# Patient Record
Sex: Male | Born: 1935 | Race: White | Hispanic: No | Marital: Married | Smoking: Former smoker
Health system: Southern US, Community
[De-identification: ages and names within clinical notes are randomized; demographics above are authoritative.]

## PROBLEM LIST (undated history)

## (undated) DIAGNOSIS — I2699 Other pulmonary embolism without acute cor pulmonale: Secondary | ICD-10-CM

## (undated) DIAGNOSIS — I639 Cerebral infarction, unspecified: Secondary | ICD-10-CM

## (undated) DIAGNOSIS — N200 Calculus of kidney: Secondary | ICD-10-CM

## (undated) DIAGNOSIS — K449 Diaphragmatic hernia without obstruction or gangrene: Secondary | ICD-10-CM

## (undated) DIAGNOSIS — H3530 Unspecified macular degeneration: Secondary | ICD-10-CM

## (undated) DIAGNOSIS — I6529 Occlusion and stenosis of unspecified carotid artery: Secondary | ICD-10-CM

## (undated) DIAGNOSIS — I10 Essential (primary) hypertension: Secondary | ICD-10-CM

## (undated) DIAGNOSIS — E669 Obesity, unspecified: Secondary | ICD-10-CM

## (undated) DIAGNOSIS — J449 Chronic obstructive pulmonary disease, unspecified: Secondary | ICD-10-CM

## (undated) DIAGNOSIS — E119 Type 2 diabetes mellitus without complications: Secondary | ICD-10-CM

## (undated) HISTORY — DX: Obesity, unspecified: E66.9

## (undated) HISTORY — DX: Cerebral infarction, unspecified: I63.9

## (undated) HISTORY — PX: FINGER SURGERY: SHX640

## (undated) HISTORY — PX: EYE SURGERY: SHX253

## (undated) HISTORY — DX: Essential (primary) hypertension: I10

## (undated) HISTORY — DX: Occlusion and stenosis of unspecified carotid artery: I65.29

## (undated) HISTORY — DX: Type 2 diabetes mellitus without complications: E11.9

---

## 2001-08-22 ENCOUNTER — Ambulatory Visit (HOSPITAL_COMMUNITY): Admission: RE | Admit: 2001-08-22 | Discharge: 2001-08-22 | Payer: Self-pay | Admitting: Pulmonary Disease

## 2001-12-04 ENCOUNTER — Other Ambulatory Visit: Admission: RE | Admit: 2001-12-04 | Discharge: 2001-12-04 | Payer: Self-pay | Admitting: Dermatology

## 2002-07-17 ENCOUNTER — Ambulatory Visit (HOSPITAL_COMMUNITY): Admission: RE | Admit: 2002-07-17 | Discharge: 2002-07-17 | Payer: Self-pay | Admitting: Pulmonary Disease

## 2002-08-08 ENCOUNTER — Ambulatory Visit (HOSPITAL_COMMUNITY): Admission: RE | Admit: 2002-08-08 | Discharge: 2002-08-08 | Payer: Self-pay | Admitting: Internal Medicine

## 2003-05-19 ENCOUNTER — Emergency Department (HOSPITAL_COMMUNITY): Admission: EM | Admit: 2003-05-19 | Discharge: 2003-05-19 | Payer: Self-pay | Admitting: Emergency Medicine

## 2003-07-23 ENCOUNTER — Encounter (INDEPENDENT_AMBULATORY_CARE_PROVIDER_SITE_OTHER): Payer: Self-pay | Admitting: Cardiology

## 2003-07-23 ENCOUNTER — Inpatient Hospital Stay (HOSPITAL_COMMUNITY): Admission: EM | Admit: 2003-07-23 | Discharge: 2003-07-25 | Payer: Self-pay | Admitting: *Deleted

## 2003-07-31 ENCOUNTER — Encounter (HOSPITAL_COMMUNITY): Admission: RE | Admit: 2003-07-31 | Discharge: 2003-08-30 | Payer: Self-pay | Admitting: Neurology

## 2004-04-09 ENCOUNTER — Other Ambulatory Visit: Admission: RE | Admit: 2004-04-09 | Discharge: 2004-04-09 | Payer: Self-pay | Admitting: Dermatology

## 2004-06-10 ENCOUNTER — Ambulatory Visit (HOSPITAL_COMMUNITY): Admission: RE | Admit: 2004-06-10 | Discharge: 2004-06-10 | Payer: Self-pay | Admitting: Pulmonary Disease

## 2006-11-05 ENCOUNTER — Emergency Department (HOSPITAL_COMMUNITY): Admission: EM | Admit: 2006-11-05 | Discharge: 2006-11-05 | Payer: Self-pay | Admitting: *Deleted

## 2006-11-08 ENCOUNTER — Ambulatory Visit (HOSPITAL_COMMUNITY): Admission: RE | Admit: 2006-11-08 | Discharge: 2006-11-08 | Payer: Self-pay | Admitting: Pulmonary Disease

## 2007-05-17 ENCOUNTER — Emergency Department (HOSPITAL_COMMUNITY): Admission: EM | Admit: 2007-05-17 | Discharge: 2007-05-17 | Payer: Self-pay | Admitting: Emergency Medicine

## 2007-07-17 ENCOUNTER — Ambulatory Visit (HOSPITAL_COMMUNITY): Admission: RE | Admit: 2007-07-17 | Discharge: 2007-07-17 | Payer: Self-pay | Admitting: Pulmonary Disease

## 2008-04-10 ENCOUNTER — Emergency Department (HOSPITAL_COMMUNITY): Admission: EM | Admit: 2008-04-10 | Discharge: 2008-04-10 | Payer: Self-pay | Admitting: Emergency Medicine

## 2008-04-20 ENCOUNTER — Emergency Department (HOSPITAL_COMMUNITY): Admission: EM | Admit: 2008-04-20 | Discharge: 2008-04-20 | Payer: Self-pay | Admitting: Emergency Medicine

## 2008-09-27 ENCOUNTER — Ambulatory Visit (HOSPITAL_COMMUNITY): Admission: RE | Admit: 2008-09-27 | Discharge: 2008-09-27 | Payer: Self-pay | Admitting: Pulmonary Disease

## 2008-11-18 ENCOUNTER — Encounter (HOSPITAL_COMMUNITY): Admission: RE | Admit: 2008-11-18 | Discharge: 2008-12-18 | Payer: Self-pay | Admitting: Neurology

## 2008-12-10 ENCOUNTER — Ambulatory Visit (HOSPITAL_COMMUNITY): Admission: RE | Admit: 2008-12-10 | Discharge: 2008-12-10 | Payer: Self-pay | Admitting: Neurology

## 2008-12-19 ENCOUNTER — Encounter (HOSPITAL_COMMUNITY): Admission: RE | Admit: 2008-12-19 | Discharge: 2009-01-18 | Payer: Self-pay | Admitting: Neurology

## 2008-12-20 ENCOUNTER — Encounter (INDEPENDENT_AMBULATORY_CARE_PROVIDER_SITE_OTHER): Payer: Self-pay | Admitting: Neurology

## 2008-12-20 ENCOUNTER — Encounter: Payer: Self-pay | Admitting: Cardiology

## 2008-12-20 ENCOUNTER — Ambulatory Visit: Payer: Self-pay

## 2009-02-06 ENCOUNTER — Inpatient Hospital Stay (HOSPITAL_COMMUNITY): Admission: EM | Admit: 2009-02-06 | Discharge: 2009-02-10 | Payer: Self-pay | Admitting: Emergency Medicine

## 2009-02-10 ENCOUNTER — Encounter (INDEPENDENT_AMBULATORY_CARE_PROVIDER_SITE_OTHER): Payer: Self-pay | Admitting: Cardiology

## 2009-02-12 ENCOUNTER — Inpatient Hospital Stay (HOSPITAL_COMMUNITY): Admission: EM | Admit: 2009-02-12 | Discharge: 2009-02-14 | Payer: Self-pay | Admitting: Emergency Medicine

## 2009-03-10 ENCOUNTER — Ambulatory Visit: Payer: Self-pay | Admitting: Vascular Surgery

## 2009-05-06 ENCOUNTER — Ambulatory Visit (HOSPITAL_COMMUNITY): Admission: RE | Admit: 2009-05-06 | Discharge: 2009-05-06 | Payer: Self-pay | Admitting: Pulmonary Disease

## 2009-05-15 ENCOUNTER — Ambulatory Visit: Payer: Self-pay | Admitting: Family Medicine

## 2009-05-16 ENCOUNTER — Inpatient Hospital Stay (HOSPITAL_COMMUNITY): Admission: EM | Admit: 2009-05-16 | Discharge: 2009-05-16 | Payer: Self-pay | Admitting: Emergency Medicine

## 2009-06-20 ENCOUNTER — Ambulatory Visit: Payer: Self-pay | Admitting: Internal Medicine

## 2009-06-20 DIAGNOSIS — K921 Melena: Secondary | ICD-10-CM | POA: Insufficient documentation

## 2009-06-20 DIAGNOSIS — R198 Other specified symptoms and signs involving the digestive system and abdomen: Secondary | ICD-10-CM

## 2009-06-24 ENCOUNTER — Ambulatory Visit (HOSPITAL_COMMUNITY): Admission: RE | Admit: 2009-06-24 | Discharge: 2009-06-24 | Payer: Self-pay | Admitting: Internal Medicine

## 2009-06-24 ENCOUNTER — Ambulatory Visit: Payer: Self-pay | Admitting: Internal Medicine

## 2009-06-25 ENCOUNTER — Encounter: Payer: Self-pay | Admitting: Internal Medicine

## 2009-06-26 ENCOUNTER — Encounter (INDEPENDENT_AMBULATORY_CARE_PROVIDER_SITE_OTHER): Payer: Self-pay

## 2009-06-30 ENCOUNTER — Encounter: Payer: Self-pay | Admitting: Internal Medicine

## 2009-08-04 ENCOUNTER — Ambulatory Visit: Payer: Self-pay | Admitting: Internal Medicine

## 2009-09-25 ENCOUNTER — Encounter (INDEPENDENT_AMBULATORY_CARE_PROVIDER_SITE_OTHER): Payer: Self-pay | Admitting: *Deleted

## 2010-04-02 ENCOUNTER — Ambulatory Visit: Payer: Self-pay | Admitting: Internal Medicine

## 2010-04-03 DIAGNOSIS — R159 Full incontinence of feces: Secondary | ICD-10-CM

## 2010-04-10 ENCOUNTER — Telehealth (INDEPENDENT_AMBULATORY_CARE_PROVIDER_SITE_OTHER): Payer: Self-pay

## 2010-04-21 ENCOUNTER — Ambulatory Visit: Payer: Self-pay | Admitting: Vascular Surgery

## 2010-04-29 ENCOUNTER — Encounter (HOSPITAL_COMMUNITY)
Admission: RE | Admit: 2010-04-29 | Discharge: 2010-05-29 | Payer: Self-pay | Source: Home / Self Care | Attending: Pulmonary Disease | Admitting: Pulmonary Disease

## 2010-05-07 ENCOUNTER — Ambulatory Visit: Payer: Self-pay | Admitting: Internal Medicine

## 2010-06-25 NOTE — Letter (Signed)
Summary: TCS ORDER  TCS ORDER   Imported By: Ricard Dillon 06/25/2009 12:48:04  _____________________________________________________________________  External Attachment:    Type:   Image     Comment:   External Document

## 2010-06-25 NOTE — Progress Notes (Signed)
Summary: PR  Phone Note Call from Patient Call back at Home Phone [phone]   Caller: Spouse- Mrs. Cato Summary of Call: pts wife called with PR. pt is doing better. She stated he was  constipated for 3 days but has been having normal bms since. They want to know if he needs to continue taking the align as well. please advise. they are not expecting a call back untill next week.  Initial call taken by: Hendricks Limes LPN,  April 10, 2010 12:30 PM     Appended Document: PR stay on align and questran  Appended Document: PR pts wife aware

## 2010-06-25 NOTE — Letter (Signed)
Summary: Recall Office Visit  [facility]  [address]   [place], [place] [address]   Phone: [phone]  Fax: [phone]      Sep 25, 2009   George Cook [address] [place], [place]  [address] 09-Sep-1927   Dear George Cook,   According to our records, it is time for you to schedule a follow-up office visit with [place].   At your convenience, please call [phone] to schedule an office visit. If you have any questions, concerns, or feel that this letter is in error, we would appreciate your call.   Sincerely,    [name]  [facility] Ph: [phone]   Fax: [phone]

## 2010-06-25 NOTE — Assessment & Plan Note (Signed)
Summary: FU OV IN ONE MONTH/FECAL INCONTINENCE/SS   Visit Type:  Follow-up Visit Primary Care Provider:  Juanetta Gosling  CC:  follow up visit.  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 He has had 2 episodes of diarrhea since last visit a month ago. Taking 1 gram of Questran every other day. Was at [facility] for a birthday party, ate fried oysters, 30 min later had diarrhea. Not on lomotil anymore. Taking align every other day. Reports BM every day. feels like it is hard at times. avoiding fried foods. No melena or hematochezia.     Current Medications (verified): 1)  Niacin Cr 500 Mg Cr-Caps (Niacin) .... Take 1 Tablet By Mouth Once A Day 2)  Vitamin C 1500 Mg Tabs (Ascorbic Acid) .... Once Daily 3)  Plavix 75 Mg Tabs (Clopidogrel Bisulfate) .... Once Daily 4)  Multivitamins  Tabs (Multiple Vitamin) .... Once Daily 5)  Amaryl 2 Mg Tabs (Glimepiride) .... Take 1 Tablet By Mouth Once A Day 6)  Antera 130mg  .... Take 1 Tablet By Mouth Once A Day 7)  Lomotil 2.5-0.025 Mg Tabs (Diphenoxylate-Atropine) .... One Tablet Every Am 8)  Bumetanide 2 Mg Tabs (Bumetanide) .... Take 1 Tablet By Mouth Once A Day 9)  Aleve .... Two Tablets At Bedtime Daily 10)  Potassium .... Take With Fluid Pill 11)  Align .... Once Daily 12)  Cholestyramine 4 Gm/dose Powd (Cholestyramine) .... Once Daily  Allergies (verified): 1)  ! Jonne Ply  Past History:  Past Medical History: Last updated: 06/20/2009 htn kidney stones hx of cva DM  Review of Systems General:  Denies fever, chills, and anorexia. Eyes:  Denies blurring, irritation, and discharge. ENT:  Denies sore throat, hoarseness, and difficulty swallowing. CV:  Denies chest pains and syncope. Resp:  Denies dyspnea at rest and wheezing. GI:  Denies difficulty swallowing, pain on swallowing, and nausea. GU:  Denies urinary burning and urinary frequency. MS:  Denies joint pain /  LOM and joint swelling. Derm:  Denies rash, itching, and dry skin. Neuro:  Denies weakness and syncope. Psych:  Denies depression and anxiety. Endo:  Denies cold intolerance and heat intolerance.  Vital Signs:  Patient profile:   77 year old male Height:      70 inches Weight:      234 pounds BMI:     33.70 Temp:     97.9 degrees F oral Pulse rate:   68 / minute BP sitting:   138 / 84  (left arm) Cuff size:   large  Vitals Entered By: Hendricks Limes LPN (May 07, 2010 11:11 AM)  Physical Exam  General:  Well developed, well nourished, no acute distress. Mouth:  No deformity or lesions, dentition normal. Lungs:  Clear throughout to auscultation. Heart:  Regular rate and rhythm; no murmurs, rubs,  or bruits. Abdomen:  normal bowel sounds, obese, without guarding, without rebound, no masses, and no hepatomegally or splenomegaly.    Impression & Recommendations:  Problem # 1:  FULL INCONTINENCE OF FECES (ICD-52.79)  77 year old male with chronic diarrhea likely r/t diabetic visceral enteropathy. only 2 episodes of diarrhea in one month. most recent after eating fried foods. likely exacerbation r/t diet. taking 1 gram questran every other day. was taking align every other day as well. occasional hard stools.  Questran 1 gram every other day Align daily Contact us in 2 weeks with progress report F/U in 3 mos or sooner as indicated  Orders: Est. Patient Level II ([address])  Patient Instructions: 1)  Questran 1 gram every other day (which is a quarter of the packet) 2)  Align daily 3)  Call in 2 weeks with report 4)  Follow-up in 3 months 5)  Avoid lomotil 6)  The medication list was reviewed and reconciled.  All changed / newly prescribed medications were explained.  A complete medication list was provided to the patient / caregiver.   Appended Document: FU OV IN ONE MONTH/FECAL INCONTINENCE/SS 3 MON F/U OPV IS IN THE COMPUTER  Appended Document: FU OV IN ONE MONTH/FECAL  INCONTINENCE/SS addendum: pt c/o minor itching/irritation rectal area. last colonoscopy 06/2009 done for diarrhea/hematochezia. thought was that hematochezia secondary to anal irritation. call in short course of anusol suppositories. if pt notes any exacerbation or any hematochezia, he is instructed to call office.

## 2010-06-25 NOTE — Assessment & Plan Note (Signed)
Summary: HOS PP.GU   Visit Type:  Follow-up Visit Primary Care Provider:  Juanetta Gosling  Chief Complaint:  F/U  PP.  History of Present Illness: Pt w/ hx fecal incontinence.  Has had recent benign colonoscopy.  Pt was taking questran 2 grams daily x 3-4 days.  He became "constipated" & had to strain w/ stool, so he d/c'd med.  Since then, no problems w/ incontinence until today where he had accident in the office.   Taking Florinex one daily, instead of Flora Q.  Blood sugars have been in good control.  The patient denies fever, weight loss, weight gain, abdominal pain, abdominal bloating, nausea, vomiting, diarrhea, melena, jaundice, chest pain, shortness of breath, urinary symptoms, back pain, joint pain, anxiety, depression, easy bruising, and swollen glands.    Current Medications (verified): 1)  Fish Oil 1000 Mg Caps (Omega-3 Fatty Acids) .... Take 1 Tablet By Mouth Once A Day 2)  Niacin Cr 500 Mg Cr-Caps (Niacin) .... Take 1 Tablet By Mouth Once A Day 3)  Vitamin C 500 Mg Tabs (Ascorbic Acid) .... Once Daily 4)  Plavix 75 Mg Tabs (Clopidogrel Bisulfate) .... Once Daily 5)  Lisinopril 10 Mg Tabs (Lisinopril) .... Once Daily 6)  Multivitamins  Tabs (Multiple Vitamin) .... Once Daily 7)  Amaryl 2 Mg Tabs (Glimepiride) .... Take 1 Tablet By Mouth Once A Day 8)  Medrol (Pak) 4 Mg Tabs (Methylprednisolone) .... As Directed 9)  Align  Caps (Probiotic Product) .... One By Mouth Daily  Allergies (verified): 1)  ! Asa  Review of Systems      See HPI General:  Denies fever, chills, sweats, anorexia, fatigue, weakness, malaise, weight loss, and sleep disorder. CV:  Denies chest pains, angina, palpitations, syncope, dyspnea on exertion, orthopnea, PND, peripheral edema, and claudication. Resp:  Denies dyspnea at rest, dyspnea with exercise, cough, sputum, wheezing, coughing up blood, and pleurisy. GI:  Complains of gas/bloating; denies difficulty swallowing, pain on swallowing, nausea,  indigestion/heartburn, vomiting, vomiting blood, abdominal pain, jaundice, bloody BM's, and black BMs. GU:  Denies urinary burning, blood in urine, urinary frequency, urinary hesitancy, nocturnal urination, and urinary incontinence. Derm:  Denies rash, itching, dry skin, hives, moles, warts, and unhealing ulcers. Psych:  Denies depression, anxiety, memory loss, suicidal ideation, hallucinations, paranoia, phobia, and confusion. Heme:  Denies bruising, bleeding, and enlarged lymph nodes.  Vital Signs:  Patient profile:   77 year old male Height:      70 inches Weight:      230 pounds BMI:     33.12 Temp:     97.8 degrees F oral Pulse rate:   72 / minute BP sitting:   140 / 80  (left arm) Cuff size:   large  Vitals Entered By: Cloria Spring LPN (August 04, 2009 2:46 PM)  Physical Exam  General:  obese.  Well developed, no acute distress. Head:  Normocephalic and atraumatic. Eyes:  Sclera clear, no icterus. Mouth:  No deformity or lesions Lungs:  Clear throughout to auscultation. Heart:  Regular rate and rhythm; no murmurs, rubs,  or bruits. Abdomen:  Soft, nontender and nondistended. No masses, hepatosplenomegaly or hernias noted. Normal bowel sounds.obese, without guarding, and without rebound.   Rectal:  Pt w/ fecal incontinence Msk:  Symmetrical with no gross deformities. Normal posture. Pulses:  Normal pulses noted. Extremities:  trace pedal edema.  clubbing Neurologic:  Alert and  oriented x4;  grossly normal neurologically. Skin:  Intact without significant lesions or rashes. Psych:  Alert and cooperative.  Normal mood and affect.  Impression & Recommendations:  Problem # 1:  CHANGE IN BOWELS (ICD-30.99) 77 y/o caucasian male w/ incontinence probably related to diabetic enteropathy improved w/ questran & probiotic.  Orders: Est. Patient Level III (04540)  Patient Instructions: 1)  ALIGN coupon given to pt  2)  Resume questran 2grams not within 2 hrs over other meds  daily as needed.  if constipated, hold questran until [facility] then resume.  Can try every other day. 3)  Office visit in 2 months or call sooner if needed.

## 2010-06-25 NOTE — Letter (Signed)
Summary: referral= hawkins  referral= hawkins   Imported By: Ricard Dillon 06/30/2009 15:57:16  _____________________________________________________________________  External Attachment:    Type:   Image     Comment:   External Document

## 2010-06-25 NOTE — Assessment & Plan Note (Signed)
Summary: NPP/CHANGE IN BOWEL HABITS.GU   Visit Type:  Initial Consult Primary Care Provider:  hawkins  Chief Complaint:  change in bowel habits.  History of Present Illness: Pleasant 77 year old gentleman referred by Dr. Juanetta Gosling to further evaluate a several month history of insidiously progressing intermittent fecal seepage and incontinence. He tells me he typically has one to 2 bowel movements daily over the past several months he continues to have that type of frequency however, he often gets  very little urge to have a bowel movement and not uncommonly has episodes of incontinence trying to get to the bathroom. He also notes intermittent low volume of blood per rectum occasionally.  He does not have stools at night. He has not lost any weight he does not have any upper GI tract symptoms such as odynophagia, dysphagia come early satiety, nausea vomiting no family history of polyps or colon cancer. He underwent a screening colonoscopy back in 2004 by Dr. Karilyn Cota. He was found to have a diminutive polyp in the proximal sigmoid colon which was ablated with cold biopsy and a few sigmoid diverticula; biopsy  came back inflammatory; he was told to return in 10 years for followup examination.  Mr. Goyer has been on antibiotics for respiratory infections only 2 occasions this past year but according to the patient and wife, bowel symptoms began prior to the antibiotic therapy. He was going to have stool studies done through Dr. Juanetta Gosling office but they have not yet been done as he reports.  Mr. Grassel is diabetic but really isn't on any specific treatment at this time. He had been on metformin thinking this might have something to do with his bowel symptoms, this agent was stopped one month ago but symptoms have not changed.  Preventive Screening-Counseling & Management  Alcohol-Tobacco     Smoking Status: quit  Current Medications (verified): 1)  Fish Oil 1000 Mg Caps (Omega-3 Fatty Acids) .... 2 Once  Daily 2)  Niacin Cr 500 Mg Cr-Caps (Niacin) .... 2 Once Daily 3)  Vitamin C 500 Mg Tabs (Ascorbic Acid) .... Once Daily 4)  Plavix 75 Mg Tabs (Clopidogrel Bisulfate) .... Once Daily 5)  Lisinopril 10 Mg Tabs (Lisinopril) .... Once Daily 6)  Multivitamins  Tabs (Multiple Vitamin) .... Once Daily  Allergies (verified): 1)  ! Jonne Ply  Past History:  Past Medical History: htn kidney stones hx of cva DM  Family History: Father: deceased- breathing problems Mother:deceased- cva, dm  Siblings: 3 brothers, 5 sisters No FH of Colon Cancer:  Social History: Marital Status: Married Children: 3 Occupation:retired   Alcohol Use - no Patient is a former smoker.  Smoking Status:  quit  Vital Signs:  Patient profile:   77 year old male Height:      70 inches Weight:      229 pounds BMI:     32.98 Temp:     98.0 degrees F oral Pulse rate:   68 / minute BP sitting:   128 / 88  (left arm) Cuff size:   regular  Vitals Entered By: Hendricks Limes LPN (June 20, 2009 2:59 PM)  Physical Exam  General:  very pleasant elderly oriented gentleman in no acute distress Eyes:  no scleral icterus conjunctiva are pink Lungs:  clear to auscultation Heart:  regular rate and rhythm without murmur gallop rub Abdomen:  abdomen somewhat obese positive bowel sounds soft nontender without appreciable mass or organomegaly. He does have some diastases recti present Rectal:  deferred until the time of  colonoscopy  Impression & Recommendations: Impression 77 year old gentleman with insidiously progressive bouts of fecal incontinence and urgency along with low volume hematochezia over the last several months. I really do not get a history of frank diarrhea. This is in stark contrast to his pre-morbid bowel habits he's had lifelong.  The cause of his hematochezia needs to be investigated further as well. I suppose this could be an unusual manifestation of microscopic colitis or infectious process. In addition,  he could have an element of diabetic visceral enteropathy predisposing him to bowel dysfunction.  Recommendations: At any rate, Mr. Schools needs a diagnostic colonoscopy near future. Risks, benefits complications, alternatives have been discussed. Questions have been answered. He's agreeable. We'll make further recommendations after colonoscopy has  being performed.  I want to thank Dr. Juanetta Gosling for his kind referral.  Other Orders: New Patient Level IV ([address])

## 2010-06-25 NOTE — Letter (Signed)
Summary: Patient Notice, Colon Biopsy Results  [facility]  [address]   [place], [place] [address]   Phone: [phone]  Fax: [phone]       June 26, 2009   George Cook [address] [place], [place]  [address] [birth year]    Dear Mr. Men,  I am pleased to inform you that the biopsies taken during your recent colonoscopy did not show any evidence of cancer upon pathologic examination.  Additional information/recommendations:   __Continue with the treatment plan as outlined on the day of your exam.   Please call us if you are having persistent problems or have questions about your condition that have not been fully answered at this time.  Sincerely,    Hendricks Limes LPN  [facility] Ph: [phone]    Fax: [phone]

## 2010-06-25 NOTE — Assessment & Plan Note (Signed)
Summary: fu ov/fecal incontinence/ss   Visit Type:  Follow-up Visit Primary Care Provider:  Juanetta Gosling  Chief Complaint:  F/U fecal incontinence/diarrhea.  History of Present Illness: 77 year old gentleman with ongoing issues of fecal incontinence and " diarrhea". He's had these symptoms for years. Recent colonoscopy reassuring. No evidence of infection or colitis. He actually got constipated on Questran 2 g orally daily - stop taking it. He takes Lomotil each morning; he still may an occasional bout of incontinence. Often does not get the urge to have a stool. He may not get the urge and doesn't make it to the bathroom in time. He is any rectal bleeding, no abdominal pain. He stopped taking Align. Again, he stopped taking Questran l because of constipationat the 2 g dose.  Preventive Screening-Counseling & Management  Alcohol-Tobacco     Smoking Status: quit > 6 months  Current Medications (verified): 1)  Niacin Cr 500 Mg Cr-Caps (Niacin) .... Take 1 Tablet By Mouth Once A Day 2)  Vitamin C 1500 Mg Tabs (Ascorbic Acid) .... Once Daily 3)  Plavix 75 Mg Tabs (Clopidogrel Bisulfate) .... Once Daily 4)  Multivitamins  Tabs (Multiple Vitamin) .... Once Daily 5)  Amaryl 2 Mg Tabs (Glimepiride) .... Take 1 Tablet By Mouth Once A Day 6)  Antera 130mg  .... Take 1 Tablet By Mouth Once A Day 7)  Lomotil 2.5-0.025 Mg Tabs (Diphenoxylate-Atropine) .... One Tablet Every Am 8)  Bumetanide 2 Mg Tabs (Bumetanide) .... Take 1 Tablet By Mouth Once A Day 9)  Aleve .... Two Tablets At Bedtime Daily  Allergies (verified): 1)  ! Jonne Ply  Past History:  Past Medical History: Last updated: 07-01-2009 htn kidney stones hx of cva DM  Family History: Last updated: 07/01/2009 Father: deceased- breathing problems Mother:deceased- cva, dm  Siblings: 3 brothers, 5 sisters No FH of Colon Cancer:  Social History: Last updated: 07/01/2009 Marital Status: Married Children: 3 Occupation:retired   Alcohol Use  - no Patient is a former smoker.   Risk Factors: Smoking Status: quit > 6 months (04/02/2010)  Past Surgical History: NONE  Social History: Smoking Status:  quit > 6 months  Physical Exam  General:  conversant in no acute distress accompanied by his wife  no jaundice conjunctiva are pink Abdomen:  obese positive bowel sounds soft nontender without appreciable mass or organomegaly  Impression & Recommendations: Impression: 77 year old gentleman with chronic long-standing diabetes and fecal incontinence issues. He really does not have frank diarrhea. I suspect he has diabetic visceral enteropathy  He actually got constipated on Questran at a  dose of 2 g daily.  I am concerned about his ongoing daily use of Lomotil secondary to potential side effects.  Recommendations: Revisit Questran at a dose of 1 g orally daily  not to be taken within 2 hours of other medication times.  Keep a stool diary. We'll plan to see him back in one month. He is to call us to let us know  how he is doing in 7 days on this regimen. Adjustments in his regimen may be made at that time.  Other Orders: Est. Patient Level IV (95621)

## 2010-08-11 ENCOUNTER — Encounter (INDEPENDENT_AMBULATORY_CARE_PROVIDER_SITE_OTHER): Payer: Self-pay | Admitting: *Deleted

## 2010-08-20 NOTE — Letter (Signed)
Summary: Recall Office Visit  [facility]  [address]   [place], [place] [address]   Phone: [phone]  Fax: [phone]      August 11, 2010   George Cook [address] [place], [place]  [address] 1927/11/19   Dear Mr. Violette,   According to our records, it is time for you to schedule a follow-up office visit with [place].   At your convenience, please call [phone] to schedule an office visit. If you have any questions, concerns, or feel that this letter is in error, we would appreciate your call.   Sincerely,    [name]  [facility] Ph: [phone]   Fax: [phone]

## 2010-08-24 LAB — URINALYSIS, ROUTINE W REFLEX MICROSCOPIC
Bilirubin Urine: NEGATIVE
Glucose, UA: NEGATIVE mg/dL
Hgb urine dipstick: NEGATIVE
Ketones, ur: 15 mg/dL — AB
Leukocytes, UA: NEGATIVE
Nitrite: NEGATIVE
Protein, ur: 100 mg/dL — AB
Specific Gravity, Urine: 1.031 — ABNORMAL HIGH (ref 1.005–1.030)
Urobilinogen, UA: 0.2 mg/dL (ref 0.0–1.0)
pH: 5.5 (ref 5.0–8.0)

## 2010-08-24 LAB — POCT CARDIAC MARKERS: Troponin i, poc: <0.05 ng/mL (ref 0.00–0.09)

## 2010-08-24 LAB — URINE CULTURE
Colony Count: NO GROWTH
Culture: NO GROWTH

## 2010-08-24 LAB — BASIC METABOLIC PANEL
Calcium: 8.6 mg/dL (ref 8.4–10.5)
Chloride: 102 mEq/L (ref 96–112)
Creatinine, Ser: 0.8 mg/dL (ref 0.4–1.5)
GFR calc Af Amer: >60 mL/min (ref >60)
GFR calc Af Amer: >60 mL/min (ref >60)
GFR calc non Af Amer: >60 mL/min (ref >60)
Potassium: 4.2 mEq/L (ref 3.5–5.1)
Sodium: 134 mEq/L — ABNORMAL LOW (ref 135–145)

## 2010-08-24 LAB — URINE MICROSCOPIC-ADD ON

## 2010-08-24 LAB — CBC
HCT: 36.2 % — ABNORMAL LOW (ref 39.0–52.0)
HCT: 40.4 % (ref 39.0–52.0)
Hemoglobin: 12.5 g/dL — ABNORMAL LOW (ref 13.0–17.0)
Hemoglobin: 13.7 g/dL (ref 13.0–17.0)
MCHC: 33.9 g/dL (ref 30.0–36.0)
MCV: 85.4 fL (ref 78.0–100.0)
MCV: 85.6 fL (ref 78.0–100.0)
Platelets: 161 K/uL (ref 150–400)
RBC: 4.24 MIL/uL (ref 4.22–5.81)
RBC: 4.72 MIL/uL (ref 4.22–5.81)
RDW: 13.7 % (ref 11.5–15.5)
WBC: 5.8 10*3/uL (ref 4.0–10.5)
WBC: 6.5 10*3/uL (ref 4.0–10.5)

## 2010-08-24 LAB — DIFFERENTIAL
Basophils Absolute: 0 K/uL (ref 0.0–0.1)
Basophils Relative: 0 % (ref 0–1)
Eosinophils Absolute: 0 K/uL (ref 0.0–0.7)
Eosinophils Relative: 1 % (ref 0–5)
Lymphocytes Relative: 16 % (ref 12–46)
Lymphs Abs: 1.1 10*3/uL (ref 0.7–4.0)
Monocytes Absolute: 0.4 10*3/uL (ref 0.1–1.0)
Monocytes Relative: 7 % (ref 3–12)
Neutro Abs: 5 10*3/uL (ref 1.7–7.7)
Neutrophils Relative %: 76 % (ref 43–77)

## 2010-08-24 LAB — GLUCOSE, CAPILLARY: Glucose-Capillary: 138 mg/dL — ABNORMAL HIGH (ref 70–99)

## 2010-08-24 LAB — CULTURE, BLOOD (ROUTINE X 2)
Culture: NO GROWTH
Culture: NO GROWTH

## 2010-08-24 LAB — POCT I-STAT 3, VENOUS BLOOD GAS (G3P V): pH, Ven: 7.422 — ABNORMAL HIGH (ref 7.250–7.300)

## 2010-08-24 LAB — BASIC METABOLIC PANEL WITH GFR
BUN: 14 mg/dL (ref 6–23)
CO2: 25 meq/L (ref 19–32)
Chloride: 99 meq/L (ref 96–112)
Glucose, Bld: 194 mg/dL — ABNORMAL HIGH (ref 70–99)
Potassium: 3.8 meq/L (ref 3.5–5.1)

## 2010-08-24 LAB — BRAIN NATRIURETIC PEPTIDE: Pro B Natriuretic peptide (BNP): <30 pg/mL (ref 0.0–100.0)

## 2010-08-24 LAB — HEMOGLOBIN A1C: Hgb A1c MFr Bld: 8 % — ABNORMAL HIGH (ref 4.6–6.1)

## 2010-08-24 LAB — LACTIC ACID, PLASMA: Lactic Acid, Venous: 3.5 mmol/L — ABNORMAL HIGH (ref 0.5–2.2)

## 2010-08-28 LAB — GLUCOSE, CAPILLARY
Glucose-Capillary: 119 mg/dL — ABNORMAL HIGH (ref 70–99)
Glucose-Capillary: 122 mg/dL — ABNORMAL HIGH (ref 70–99)
Glucose-Capillary: 138 mg/dL — ABNORMAL HIGH (ref 70–99)
Glucose-Capillary: 144 mg/dL — ABNORMAL HIGH (ref 70–99)
Glucose-Capillary: 151 mg/dL — ABNORMAL HIGH (ref 70–99)
Glucose-Capillary: 154 mg/dL — ABNORMAL HIGH (ref 70–99)
Glucose-Capillary: 155 mg/dL — ABNORMAL HIGH (ref 70–99)
Glucose-Capillary: 176 mg/dL — ABNORMAL HIGH (ref 70–99)
Glucose-Capillary: 181 mg/dL — ABNORMAL HIGH (ref 70–99)
Glucose-Capillary: 200 mg/dL — ABNORMAL HIGH (ref 70–99)
Glucose-Capillary: 258 mg/dL — ABNORMAL HIGH (ref 70–99)

## 2010-08-28 LAB — BASIC METABOLIC PANEL
BUN: 9 mg/dL (ref 6–23)
BUN: 17 mg/dL (ref 6–23)
CO2: 27 mEq/L (ref 19–32)
CO2: 27 mEq/L (ref 19–32)
Calcium: 8.7 mg/dL (ref 8.4–10.5)
Calcium: 8.8 mg/dL (ref 8.4–10.5)
Calcium: 8.9 mg/dL (ref 8.4–10.5)
Chloride: 106 mEq/L (ref 96–112)
Chloride: 101 mEq/L (ref 96–112)
Creatinine, Ser: 0.82 mg/dL (ref 0.4–1.5)
Creatinine, Ser: 0.87 mg/dL (ref 0.4–1.5)
Creatinine, Ser: 0.98 mg/dL (ref 0.4–1.5)
GFR calc Af Amer: >60 mL/min (ref >60)
GFR calc Af Amer: >60 mL/min (ref >60)
GFR calc non Af Amer: >60 mL/min (ref >60)
GFR calc non Af Amer: 57 mL/min — ABNORMAL LOW (ref >60)
Glucose, Bld: 162 mg/dL — ABNORMAL HIGH (ref 70–99)
Potassium: 3.9 mEq/L (ref 3.5–5.1)
Potassium: 4.2 mEq/L (ref 3.5–5.1)
Sodium: 140 mEq/L (ref 135–145)
Sodium: 136 mEq/L (ref 135–145)

## 2010-08-28 LAB — CBC
HCT: 37.2 % — ABNORMAL LOW (ref 39.0–52.0)
HCT: 39.6 % (ref 39.0–52.0)
HCT: 41.2 % (ref 39.0–52.0)
Hemoglobin: 12.9 g/dL — ABNORMAL LOW (ref 13.0–17.0)
Hemoglobin: 13.6 g/dL (ref 13.0–17.0)
Hemoglobin: 14.5 g/dL (ref 13.0–17.0)
MCHC: 34.1 g/dL (ref 30.0–36.0)
MCHC: 34.3 g/dL (ref 30.0–36.0)
MCHC: 34.6 g/dL (ref 30.0–36.0)
MCV: 84.1 fL (ref 78.0–100.0)
MCV: 84.5 fL (ref 78.0–100.0)
MCV: 84.2 fL (ref 78.0–100.0)
Platelets: 160 10*3/uL (ref 150–400)
Platelets: 170 10*3/uL (ref 150–400)
Platelets: 174 10*3/uL (ref 150–400)
RBC: 4.55 MIL/uL (ref 4.22–5.81)
RBC: 4.82 MIL/uL (ref 4.22–5.81)
RBC: 4.89 MIL/uL (ref 4.22–5.81)
RDW: 13.1 % (ref 11.5–15.5)
WBC: 4.9 10*3/uL (ref 4.0–10.5)
WBC: 5.8 10*3/uL (ref 4.0–10.5)
WBC: 6.9 10*3/uL (ref 4.0–10.5)

## 2010-08-28 LAB — COMPREHENSIVE METABOLIC PANEL
BUN: 11 mg/dL (ref 6–23)
CO2: 30 mEq/L (ref 19–32)
Calcium: 9.1 mg/dL (ref 8.4–10.5)
Creatinine, Ser: 0.92 mg/dL (ref 0.4–1.5)
GFR calc Af Amer: >60 mL/min (ref >60)
GFR calc non Af Amer: >60 mL/min (ref >60)
Glucose, Bld: 224 mg/dL — ABNORMAL HIGH (ref 70–99)

## 2010-08-28 LAB — URINE CULTURE: Colony Count: NO GROWTH

## 2010-08-28 LAB — DIFFERENTIAL
Eosinophils Absolute: 0.5 10*3/uL (ref 0.0–0.7)
Eosinophils Relative: 7 % — ABNORMAL HIGH (ref 0–5)
Lymphocytes Relative: 27 % (ref 12–46)
Lymphs Abs: 1.9 10*3/uL (ref 0.7–4.0)
Monocytes Absolute: 0.6 10*3/uL (ref 0.1–1.0)
Monocytes Relative: 8 % (ref 3–12)

## 2010-08-28 LAB — LIPID PANEL
Cholesterol: 193 mg/dL (ref 0–200)
LDL Cholesterol: UNDETERMINED mg/dL (ref 0–99)
Total CHOL/HDL Ratio: 5.7 RATIO

## 2010-08-28 LAB — URINALYSIS, ROUTINE W REFLEX MICROSCOPIC
Bilirubin Urine: NEGATIVE
Glucose, UA: 100 mg/dL — AB
Hgb urine dipstick: NEGATIVE
Nitrite: NEGATIVE
Protein, ur: NEGATIVE mg/dL
Specific Gravity, Urine: 1.01 (ref 1.005–1.030)
Urobilinogen, UA: 0.2 mg/dL (ref 0.0–1.0)
Urobilinogen, UA: 0.2 mg/dL (ref 0.0–1.0)
pH: 5.5 (ref 5.0–8.0)

## 2010-08-28 LAB — BRAIN NATRIURETIC PEPTIDE: Pro B Natriuretic peptide (BNP): 71 pg/mL (ref 0.0–100.0)

## 2010-08-28 LAB — CULTURE, BLOOD (ROUTINE X 2)
Culture: NO GROWTH
Culture: NO GROWTH
Report Status: 9272010
Report Status: 9272010

## 2010-08-28 LAB — APTT: aPTT: 27 seconds (ref 24–37)

## 2010-08-28 LAB — PROTIME-INR: INR: 1 (ref 0.00–1.49)

## 2010-08-28 LAB — D-DIMER, QUANTITATIVE: D-Dimer, Quant: 0.41 ug/mL-FEU (ref 0.00–0.48)

## 2010-08-28 LAB — CK TOTAL AND CKMB (NOT AT ARMC): Relative Index: INVALID (ref 0.0–2.5)

## 2010-08-28 LAB — HEMOGLOBIN A1C: Hgb A1c MFr Bld: 8.6 % — ABNORMAL HIGH (ref 4.6–6.1)

## 2010-08-30 LAB — CREATININE, SERUM
Creatinine, Ser: 1 mg/dL (ref 0.4–1.5)
GFR calc Af Amer: >60 mL/min (ref >60)

## 2010-10-06 NOTE — Procedures (Signed)
CAROTID DUPLEX EXAM   INDICATION:  Carotid disease.   HISTORY:  Diabetes:  Yes.  Cardiac:  No.  Hypertension:  No.  Smoking:  Previous.  Previous Surgery:  No.  CV History:  History of strokes in both hemispheres, complaint of  dizziness.  Amaurosis Fugax No, Paresthesias No, Hemiparesis No                                       RIGHT             LEFT  Brachial systolic pressure:         156               150  Brachial Doppler waveforms:         Normal            Normal  Vertebral direction of flow:        Antegrade         Antegrade  DUPLEX VELOCITIES (cm/sec)  CCA peak systolic                   49                61  ECA peak systolic                   70                79  ICA peak systolic                   71                124  ICA end diastolic                   14                25  PLAQUE MORPHOLOGY:                  Heterogeneous     Heterogeneous  PLAQUE AMOUNT:                      Mild              Mild / moderate  PLAQUE LOCATION:                    Proximal ICA / CCA                  Proximal ICA   IMPRESSION:  1. No hemodynamically significant stenosis at the right internal      carotid artery with plaque formations as described above.  2. Doppler velocity suggests high end 20%-39% stenosis of the left      proximal internal carotid artery.  3. No significant change noted when compared to the previous exam on      03/10/2009.   ___________________________________________  Quita Skye. Hart Rochester, M.D.   CH/MEDQ  D:  04/21/2010  T:  04/21/2010  Job:  [id number]

## 2010-10-06 NOTE — Consult Note (Signed)
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  This  gentleman has a remote history of right brain stroke in 2005, which  consisted of some slurred speech and some left-sided weakness with tPA.  He had good relief of symptoms and had another stroke in the spring or  summer of 2010 affecting his left side, which he also regained good  function.  September 16 he developed onset of clumsiness in his right  upper extremity with weakness and was found to have evidence of a left  brain stroke by MRI scanning with no evidence of hemorrhage.  During his  evaluation, he was found to have bilateral mild carotid occlusive  disease by ultrasound.  A CT angiogram revealed what was determined to  be a 65% left internal carotid stenosis, which was smooth.  I have  reviewed the CT angiogram as well as the MRI and carotid duplex exams  for this patient.  He has had good retrieval of his function in the  right side since that time and was referred today for his carotid  disease.  He has had no symptoms of amaurosis fugax and no recurrent  neurologic symptoms, although he does state that he has had some  weakness in his right lower extremity for the last several months.   PAST MEDICAL HISTORY:  1. Out of control diabetes - Stable.  2. Dyslipidemia - Stable.  3. Hypertension - Stable.  4. History of tobacco abuse.  5. Obesity.  6. Negative for coronary artery disease or COPD.   PREVIOUS SURGERIES:  None.  Has had colonoscopy.   FAMILY HISTORY:  Positive for diabetes and stroke in his mother and a  sister.  Coronary artery disease in father and a brother.   SOCIAL HISTORY:  He has 4 children and is married.  He quit smoking in  1993.  He has been retired since that  time.  He smoked 1 to 2 packs of  cigarettes a day for 40+ years.  He does not use alcohol.   REVIEW OF SYSTEMS:  The patient does have dyspnea on exertion, a heart  murmur, history of hiatal hernia, urinary frequency, deep vein  thrombosis in the left leg 18 months ago, diffuse arthritis.  No  claudication.  Otherwise, all systems are negative.  Please see health  encounter form.   ALLERGIES:  Aspirin.   MEDICATIONS:  Include Plavix 75 mg per day since 2005.  He was on  Coumadin for 6 months 2 years ago but has been discontinued since then.   PHYSICAL EXAMINATION:  Blood pressure is 132/70, heart rate 70,  respirations 18.  Generally, he is alert and oriented x3.  HEENT exam is  negative.  His neck is supple.  3+ carotid pulses.  No bruits are  audible.  Neurologic exam is normal.  No palpable adenopathy in the  neck.  Skin is free of rashes.  Chest is clear to auscultation.  Cardiovascular exam is a regular rhythm with no murmurs.  Abdomen is  soft and nontender.  No palpable masses.  Has 3+ femoral and popliteal  pulses bilaterally.  He does have 2+ edema in both lower extremities,  left worse than right, and from the knee to the foot.   I reviewed his carotid duplex exam, which was done in our office, and  compared that to the CT angiogram MRI scan, and other previous studies.  I do not feel that his carotid disease is severe enough to account for  his left brain stroke since it is very mild in our office in the 20% to  30% range in severity.  It does not appear ulcerated.  He has had  strokes in both hemispheres in the past.  Etiology is probably something  other than carotid disease.  I discussed this at length with his family,  including his sister and wife.  I do not feel carotid surgery is  indicated at this time.  We will see him back in 12 months with carotid  duplex exam unless he develops any symptoms in the interim.   George [name] [place], M.D.  Electronically Signed    JDL/MEDQ  D:  03/10/2009  T:  03/10/2009  Job:  [id number]   cc:   George [name], M.D.  George Feinstein, MD

## 2010-10-06 NOTE — Procedures (Signed)
CAROTID DUPLEX EXAM   INDICATION:  Followup of carotid disease.   HISTORY:  Diabetes:  Yes  Cardiac:  No  Hypertension:  No  Smoking:  Previous, 1993 quit.  Previous Surgery:  CV History:  Amaurosis Fugax Yes  No, Paresthesias Yes No, Hemiparesis Yes No                                       RIGHT             LEFT  Brachial systolic pressure:         117               125  Brachial Doppler waveforms:         Triphasic         Triphasic  Vertebral direction of flow:        Antegrade         Antegrade  DUPLEX VELOCITIES (cm/sec)  CCA peak systolic                   40                41  ECA peak systolic                   58                58  ICA peak systolic                   60                84  ICA end diastolic                   11                26  PLAQUE MORPHOLOGY:                  Mixed             Mixed  PLAQUE AMOUNT:                      Mild              Mild  PLAQUE LOCATION:                    ICA               ICA   IMPRESSION:  20% to 39% stenosis noted on internal carotid arteries  bilaterally.   ___________________________________________  Quita Skye Hart [place], M.D.   CJ/MEDQ  D:  03/10/2009  T:  03/10/2009  Job:  [id number]

## 2010-10-09 NOTE — Discharge Summary (Signed)
NAME:  George Cook, George Cook                           ACCOUNT NO.:  [id number]   MEDICAL RECORD NO.:  [id number]                   PATIENT TYPE:  INP   LOCATION:  3040                                 FACILITY:  [facility]   PHYSICIAN:  Pramod P. Pearlean Brownie, MD                 DATE OF BIRTH:  25-Aug-1927   DATE OF ADMISSION:  07/23/2003  DATE OF DISCHARGE:  07/25/2003                                 DISCHARGE SUMMARY   DISCHARGE DIAGNOSES:  1. Acute small right high parietal infarct.  2. Type 2 diabetes.  3. History of cigarette use.  4. History of kidney stones.   DISCHARGE MEDICATIONS:  Plavix 75 mg daily.   STUDIES:  1. CT of head on admission shows two or three infarcts in the right basal     ganglia which are old.  No acute abnormalities.  Next CT 24 hours after     tPA administration again shows old right basal ganglia infarct.  No acute     hemorrhage or mass.  No new areas of abnormality.  2. MRI of the brain shows small acute high parietal subcortical infarction.  3. MRA of the brain shows no acute occlusion of large or medium-size     vessels.  4. A 2-D echocardiogram shows ejection fraction of 65 to75% with inadequate     LV wall function.  No obvious embolic source, questionable mild aortic     stenosis.  5. Carotid Dopplers were normal.  6. Chest x-ray: Probable mild pulmonary vascular engorgement and     interstitial edema.  7. EKG: Normal sinus rhythm with occasional PVC, right bundle branch block.     No significant change since last tracing.  8. Transcranial Doppler performed with results pending.  9. Hemoglobin A1C 7.2.  Hemoglobin 15.7, hematocrit 47.8,white blood cells     8.1, platelets 273, differential normal.  Coagulation studies normal.     Chemistry normal except for glucose 152 on admission. Liver function     tests normal.  Homocysteine and lipid panel drawn nonfasting at time of     discharge will need followup.   HISTORY OF PRESENT ILLNESS:  [name] is a  77 year old, right-handed  white male, with a history of new onset diabetes in December 2004, who  collapsed the morning of admission at his truck after eating breakfast at  about 8:45.  He stated left leg gave way.  His friend called 911, and he was  transported to the hospital.  He has no stroke, heart, or lung history.  He  stopped smoking 15 years ago.  Code stroke was called by nurse, and patient  was administered tPA.  He was not a __________ candidate secondary to lace  of severity.  NIH stroke scale on admission was 5.   HOSPITAL COURSE:  MRI did reveal small acute right high parietal infarct,  probably lessened in severity  by administration of tPA.  The patient's left  hemiparesis and dysarthria improved significantly during hospitalization,  and he is almost at baseline at time of discharge.  He will need followup PT  and OT as an outpatient.   Homocysteine and lipids will be drawn at time of discharge and followed up  as outpatient.  Prefer LDL to be less than 100.  Hemoglobin A1C also  elevated at 7.2.  While sugars have remained good per patient, now post  stroke event, need to keep as normal glycemic as possible.  The patient may  require oral agents or stricter diet with weight lost.  This was explained  to patient, wife,and son, and they all understand and agree.  Source of  stroke was felt to be small vessel in origin.  No other abnormalities noted.   CONDITION ON DISCHARGE:  Patient alert and oriented x 3.  Speech clear.  Very mild left hemiparesis at 4+/5.  Ambulates, steady on slope, and when he  increases speed, he loses his balance.  He has decreased fine motor movement  on the left.  Chest is clear.  Heart rate is regular.   DISCHARGE PLANNING:  1. Discharge home with wife.  2. Follow up lipids and homocysteine as outpatient.  Goal LDL less than 100.  3. Will need to start Foltx 1 daily for elevated homocysteine.  4. Follow up with Dr. Juanetta Gosling, primary  physician, within the next month.     Need tight glucose control.  Hemoglobin A1C 7.2 not acceptable in setting     of acute stroke.  5. Follow up with Dr. Delia Heady in two months.  Make appointment.      Annie Main, N.P.                         Pramod P. Pearlean Brownie, MD    SB/MEDQ  D:  07/25/2003  T:  07/26/2003  Job:  [id number]   cc:   Ramon Dredge L. Juanetta Gosling, M.D.  [address]  [place]  [place] [address]  Fax: [phone]

## 2010-10-09 NOTE — Op Note (Signed)
NAME:  George Cook, George Cook                           ACCOUNT NO.:  [id number]   MEDICAL RECORD NO.:  [id number]                   PATIENT TYPE:  AMB   LOCATION:  DAY                                  FACILITY:  [facility]   PHYSICIAN:  Lionel December, M.D.                 DATE OF BIRTH:  1927-06-13   DATE OF PROCEDURE:  08/08/2002  DATE OF DISCHARGE:                                 OPERATIVE REPORT   PROCEDURE:  Total colonoscopy.   ENDOSCOPIST:  Lionel December, M.D.   INDICATIONS:  George Cook is a 77 year old Caucasian male who is here for  screening colonoscopy.  Family history is negative for CRC.  The procedures  were reviewed with the patient and informed consent was obtained.   PREOPERATIVE MEDICATIONS:  Demerol 25 mg IV and Versed 3 mg IV in divided  dose.   INSTRUMENT:  Olympus video system.   FINDINGS:  Procedure was performed in endoscopy suite.  The patient's vital  signs and O2 saturations were monitored during the procedure and remained  stable.  The patient was placed in the left lateral recumbent position and  rectal examination was performed.  This was within normal limits.   The scope was placed in the rectum and advanced under vision into the  sigmoid colon and beyond.  Preparation was excellent.  Scope was passed to  the cecum which was identified by ileocecal valve and appendiceal orifice.  Pictures were taken for the record.  The colonic mucosa was once again  carefully examined on the way out.  There was a single small polyp at the  proximal sigmoid colon which was ablated by cold biopsy.  There were a few  tiny diverticula at the distal sigmoid colon.  The rectal mucosa was normal.   The scope was retroflexed to examine the anorectal junction which is  unremarkable. The endoscope was straightened and withdrawn.  The patient  tolerated the procedure well.   FINAL DIAGNOSES:  1. Small polyp at the proximal sigmoid colon which was ablated by cold     biopsy.  2. A  few diverticula at sigmoid colon.    RECOMMENDATIONS:  1. High fiber diet.  2. I will contact the patient with the biopsy results.  If this is an     adenoma he will return for a follow up exam in 5 years; otherwise could     wait 10.                                               Lionel December, M.D.    NR/MEDQ  D:  08/08/2002  T:  08/08/2002  Job:  [id number]   cc:   Ramon Dredge L. Juanetta Gosling, M.D.  [address]  [place]  [place] [address]  Fax: [phone]

## 2010-10-09 NOTE — H&P (Signed)
NAME:  George Cook, George Cook                           ACCOUNT NO.:  [id number]   MEDICAL RECORD NO.:  [id number]                   PATIENT TYPE:  INP   LOCATION:  3114                                 FACILITY:  [facility]   PHYSICIAN:  Casimiro Needle L. Thad Ranger, M.D.           DATE OF BIRTH:  09/16/1927   DATE OF ADMISSION:  07/23/2003  DATE OF DISCHARGE:                                HISTORY & PHYSICAL   CHIEF COMPLAINT:  Legs gave out.   HISTORY OF PRESENT ILLNESS:  This is the initial inpatient stroke service  admission for this 77 year old right-handed man with a history of diabetes,  who was eating breakfast at a restaurant this morning, and, at approximately  8:45 a.m., walked out to his truck and collapsed.  He states his left leg  gave way.  A friend witnessed the event, immediately called 911, and the  patient was transported to the hospital, where a code stroke was called at  9:15.  The patient initially denied having any similar symptoms, but  subsequently reported that 2 days ago he did have a transient spell in which  his left leg got weak.  He denies any other history of known stroke or  previous stroke-like symptoms.  He was seen in the emergency room for an  episode in which both legs gave out back around Christmas time, and this was  ascribed to high sugar; in fact, his diabetes diagnosis was made at that  time.  This has remained diet controlled since then.  The patient's symptoms  had actually improved since arriving in the emergency room, in that his  speech, which was slurred on admission, is clearing, and he is gaining more  strength on his left side.   MEDICAL HISTORY:  1. Remarkable for diabetes, which was diagnosed in December of 2004, as     above, and is currently diet controlled.  2. History of kidney stones.   PAST SURGICAL HISTORY:  He has had no previous surgeries.   SOCIAL HISTORY:  He lives with his wife.  He has been independent in his  activities of daily  living.  He is not presently working.   MEDICATIONS:  He takes no prescription medications.  He does take Benadryl  p.r.n. for sleep.   REVIEW OF SYSTEMS:  He does have a little bit of a rattling in his chest,  which he has ascribed to allergies.  He also reports occasional bowel  urgency and some frequent nocturia.  He has a little insomnia sometimes,  which is treated with Benadryl.  Full 10-system review of systems otherwise  negative, as outlined in the emergency room and admission nursing records.   PHYSICAL EXAMINATION:  VITAL SIGNS:  Temperature 97.8, blood pressure  145/80, pulse 74, respirations 16, O2 saturation 99% on 2 liters O2 nasal  cannula.  GENERAL:  He is alert, and in no obvious distress.  Speech is mildly  to  moderately dysarthric, but is fluent, and he is able to follow multi-step  commands.  HEENT:  Cranium was normocephalic and atraumatic.  Oropharynx is benign.  NECK:  Supple without carotid bruits.  HEART:  Regular rate and rhythm without murmur.  CHEST:  Clear to auscultation.  ABDOMEN:  Obese but soft, with no hepatosplenomegaly.  EXTREMITIES:  With 1+ edema, good pulses.  NEUROLOGIC:  Cranial nerves - pupils are 3 mm, briskly reactive.  Extraocular movements are full without nystagmus.  Visual fields are full to  confrontation.  Pinprick sensation symmetric over the face.  There is a mild  left facial weakness with a droop.  Tongue and palate move symmetrically.  Motor - a mild left hemiparesis present, a little bit worse in the leg than  the arm.  He maintains at least antigravity strength in all extremity  muscles.  Sensation - reports diminished pinprick sensation in the left  upper and lower extremity compared to the right.  Rapid movements are slow  on the right.  Gait is deferred.  Toes are downgoing.   LABORATORY REVIEW:  CBC revealed a white count of 8.1, hemoglobin 15.7,  platelets 273,000, with a normal differential.  BMET is remarkable for an   elevated glucose of 152.  Otherwise, normal.  Prothrombin time is 13.3  seconds, INR 1.0, PTT 34 seconds.  CT of the head is personally reviewed and  demonstrates a couple of old lacunar infarctions in the basal ganglia and  deep white matter on the left.  There is also some small vessel disease, a  little bit worse on the left than the right.  There appears to be no acute  pathology.  EKG demonstrates right bundle branch block, sinus rhythm, no  acute injury.   IMPRESSION:  1. Acute right brain stroke.  2. Diabetes, previously diet controlled.  3. Elevated blood pressure.   PLAN:  TPA was administered per protocol in the emergency room.  The patient  subsequently be admitted to the neurologic ICU for routine post TPA  management and routine stroke work-up including MRI, MRA, carotid  transcranial Dopplers, echocardiogram, and stroke laboratories.  PT, OT, and  speech therapy consults will be obtained.  Stroke service to follow.                                                Michael L. Thad Ranger, M.D.    MLR/MEDQ  D:  07/23/2003  T:  07/24/2003  Job:  [id number]   cc:   Ramon Dredge L. Juanetta Gosling, M.D.  [address]  [place]  [place] [address]  Fax: [phone]

## 2010-10-09 NOTE — Discharge Summary (Signed)
NAME:  George Cook, George Cook                           ACCOUNT NO.:  [id number]   MEDICAL RECORD NO.:  [id number]                   PATIENT TYPE:  INP   LOCATION:                                       FACILITY:  [facility]   PHYSICIAN:  Annie Main, N.P.                   DATE OF BIRTH:  1927/09/21   DATE OF ADMISSION:  07/23/2003  DATE OF DISCHARGE:  07/25/2003                                 DISCHARGE SUMMARY   ADDENDUM:  At the bottom of the discharge plan, put Plavix 75 mg for  secondary stroke prevention. Then in the paragraph if you will add patient  has allergic reaction to aspirin. Reaction was that of hives. Will not be  able to be placed on Aggrenox secondary to aspirin allergy. Will treat with  Plavix 75 mg q.d.                                                Annie Main, N.P.    SB/MEDQ  D:  07/25/2003  T:  07/26/2003  Job:  [id number]

## 2010-12-11 ENCOUNTER — Encounter (HOSPITAL_COMMUNITY): Payer: Self-pay

## 2010-12-11 ENCOUNTER — Emergency Department (HOSPITAL_COMMUNITY)
Admission: EM | Admit: 2010-12-11 | Discharge: 2010-12-11 | Disposition: A | Discharge status: Home / Self Care | Payer: Medicare Other | Attending: Emergency Medicine | Admitting: Emergency Medicine

## 2010-12-11 ENCOUNTER — Emergency Department (HOSPITAL_COMMUNITY): Payer: Medicare Other

## 2010-12-11 DIAGNOSIS — E119 Type 2 diabetes mellitus without complications: Secondary | ICD-10-CM | POA: Insufficient documentation

## 2010-12-11 DIAGNOSIS — N509 Disorder of male genital organs, unspecified: Secondary | ICD-10-CM | POA: Insufficient documentation

## 2010-12-11 DIAGNOSIS — N453 Epididymo-orchitis: Secondary | ICD-10-CM | POA: Insufficient documentation

## 2010-12-11 DIAGNOSIS — I10 Essential (primary) hypertension: Secondary | ICD-10-CM | POA: Insufficient documentation

## 2010-12-11 DIAGNOSIS — Z87442 Personal history of urinary calculi: Secondary | ICD-10-CM | POA: Insufficient documentation

## 2010-12-11 DIAGNOSIS — Z8673 Personal history of transient ischemic attack (TIA), and cerebral infarction without residual deficits: Secondary | ICD-10-CM | POA: Insufficient documentation

## 2010-12-11 DIAGNOSIS — R109 Unspecified abdominal pain: Secondary | ICD-10-CM | POA: Insufficient documentation

## 2010-12-11 LAB — DIFFERENTIAL
Basophils Absolute: 0 10*3/uL (ref 0.0–0.1)
Eosinophils Absolute: 0.1 10*3/uL (ref 0.0–0.7)
Eosinophils Relative: 1 % (ref 0–5)
Lymphocytes Relative: 12 % (ref 12–46)
Lymphs Abs: 2.3 10*3/uL (ref 0.7–4.0)
Monocytes Absolute: 1.1 10*3/uL — ABNORMAL HIGH (ref 0.1–1.0)

## 2010-12-11 LAB — URINALYSIS, ROUTINE W REFLEX MICROSCOPIC
Bilirubin Urine: NEGATIVE
Glucose, UA: 250 mg/dL — AB
Ketones, ur: NEGATIVE mg/dL
Protein, ur: 100 mg/dL — AB
pH: 6.5 (ref 5.0–8.0)

## 2010-12-11 LAB — CBC
HCT: 45.4 % (ref 39.0–52.0)
MCH: 28.6 pg (ref 26.0–34.0)
MCV: 86.5 fL (ref 78.0–100.0)
Platelets: 228 10*3/uL (ref 150–400)
RDW: 13.1 % (ref 11.5–15.5)
WBC: 18.4 10*3/uL — ABNORMAL HIGH (ref 4.0–10.5)

## 2010-12-11 LAB — BASIC METABOLIC PANEL
CO2: 28 mEq/L (ref 19–32)
Calcium: 9.7 mg/dL (ref 8.4–10.5)
Chloride: 97 mEq/L (ref 96–112)
Creatinine, Ser: 0.83 mg/dL (ref 0.50–1.35)
Glucose, Bld: 133 mg/dL — ABNORMAL HIGH (ref 70–99)

## 2010-12-11 LAB — URINE MICROSCOPIC-ADD ON

## 2010-12-11 MED ORDER — CIPROFLOXACIN IN D5W 400 MG/200ML IV SOLN
400.0000 mg | Freq: Once | INTRAVENOUS | Status: AC
  Administered 2010-12-11: 400 mg via INTRAVENOUS
  Filled 2010-12-11: qty 200

## 2010-12-11 MED ORDER — SODIUM CHLORIDE 0.9 % IV SOLN
INTRAVENOUS | Status: DC
  Administered 2010-12-11: 12:00:00 via INTRAVENOUS

## 2010-12-11 MED ORDER — HYDROMORPHONE HCL 1 MG/ML IJ SOLN
1.0000 mg | Freq: Once | INTRAMUSCULAR | Status: AC
  Administered 2010-12-11: 1 mg via INTRAVENOUS
  Filled 2010-12-11: qty 1

## 2010-12-11 MED ORDER — HYDROCODONE-ACETAMINOPHEN 5-325 MG PO TABS: 20.0000 | ORAL_TABLET | Freq: Four times a day (QID) | ORAL | Status: AC | PRN

## 2010-12-11 MED ORDER — CIPROFLOXACIN HCL 500 MG PO TABS: 500.0000 mg | ORAL_TABLET | Freq: Two times a day (BID) | ORAL | Status: AC

## 2010-12-11 MED ORDER — ONDANSETRON HCL 4 MG/2ML IJ SOLN
4.0000 mg | Freq: Once | INTRAMUSCULAR | Status: AC
  Administered 2010-12-11: 4 mg via INTRAVENOUS
  Filled 2010-12-11: qty 2

## 2010-12-11 NOTE — ED Notes (Signed)
Pt still in radiology.

## 2010-12-11 NOTE — ED Notes (Signed)
Vital signs stable.  02 sat 99% on 2L

## 2010-12-11 NOTE — ED Provider Notes (Signed)
History     Chief Complaint  Patient presents with  . Testicle Pain   Patient is a 77 y.o. male presenting with testicular pain. The history is provided by the patient. No language interpreter was used.  Testicle Pain This is a new problem. Episode onset: 3 days ago. The problem occurs constantly. The problem has not changed since onset.Pertinent negatives include no chest pain, no abdominal pain, no headaches and no shortness of breath. The symptoms are aggravated by nothing. Relieved by: Moderate relief in ED with Dilaudid. He has tried nothing for the symptoms.  C/o right testicular and right groin pain which radiates to abdomen and right lower back onset 3 days ago and persistent since. Notes pain has improved after administration of Dilaudid in ED. Pain aggravated with palpation. Patient also reports he has been squeezing his scrotum "real hard" to help him urinate recently. Denies n/v, dysuria, hematuria.   Patient seen at 12:10 PM  Past Medical History  Diagnosis Date  . HTN (hypertension)   . DM (diabetes mellitus)   . H/O: CVA (cardiovascular accident)   . Kidney stones     History reviewed. No pertinent past surgical history.  History reviewed. No pertinent family history.  History  Substance Use Topics  . Smoking status: Former Games developer  . Smokeless tobacco: Not on file  . Alcohol Use: No      Review of Systems  Constitutional: Negative for fatigue.  HENT: Negative for congestion, sinus pressure and ear discharge.   Eyes: Negative for discharge.  Respiratory: Negative for cough and shortness of breath.   Cardiovascular: Negative for chest pain.  Gastrointestinal: Negative for abdominal pain and diarrhea.  Genitourinary: Positive for scrotal swelling and testicular pain. Negative for dysuria, frequency, hematuria, penile swelling and penile pain.  Musculoskeletal: Negative for back pain.  Skin: Negative for rash.  Neurological: Negative for seizures and headaches.   Hematological: Negative.   Psychiatric/Behavioral: Negative for hallucinations.    Physical Exam  BP 157/73  Pulse 81  Temp(Src) 97.2 F (36.2 C) (Oral)  Resp 20  Ht 5\' 10"  (1.778 m)  Wt 230 lb (104.327 kg)  BMI 33.00 kg/m2  SpO2 99%  Physical Exam  Nursing note and vitals reviewed. Constitutional: He is oriented to person, place, and time. He appears well-developed. No distress.       Hypertensive.   HENT:  Head: Normocephalic and atraumatic.  Eyes: Conjunctivae and EOM are normal. No scleral icterus.  Neck: Normal range of motion. Neck supple.  Cardiovascular: Normal rate and regular rhythm.  Exam reveals no gallop and no friction rub.   No murmur heard. Pulmonary/Chest: Effort normal. No stridor. He has wheezes (bilaterally). He has no rales. He exhibits no tenderness.  Abdominal: Soft. He exhibits no distension. There is tenderness in the right lower quadrant. There is no rebound.  Genitourinary: Penis normal. Right testis shows swelling (approxmately 50% larger than left side) and tenderness. Left testis shows no swelling and no tenderness.       Normal external genitalia. Chaperone present during exam.   Musculoskeletal: Normal range of motion. He exhibits no edema.  Neurological: He is alert and oriented to person, place, and time.  Skin: No rash noted. No erythema.  Psychiatric: He has a normal mood and affect. His behavior is normal.    ED Course  Procedures 2:06 PM Reviewed lab results and imaging results with patient and spouse. Informed of intent to d/c home with medications to treat scrotal infection. Advised of  need for follow up to check progress of the infection/treatment. Patient and spouse agrees with plan set forth at this time.   MDM Results discussed with pt  Results for orders placed during the hospital encounter of 12/11/10  BASIC METABOLIC PANEL      Component Value Range   Sodium 135  135 - 145 (mEq/L)   Potassium 4.4  3.5 - 5.1 (mEq/L)    Chloride 97  96 - 112 (mEq/L)   CO2 28  19 - 32 (mEq/L)   Glucose, Bld 133 (*) 70 - 99 (mg/dL)   BUN 16  6 - 23 (mg/dL)   Creatinine, Ser 4.54  0.50 - 1.35 (mg/dL)   Calcium 9.7  8.4 - 09.8 (mg/dL)   GFR calc non Af Amer >60  >60 (mL/min)   GFR calc Af Amer >60  >60 (mL/min)  CBC      Component Value Range   WBC 18.4 (*) 4.0 - 10.5 (K/uL)   RBC 5.25  4.22 - 5.81 (MIL/uL)   Hemoglobin 15.0  13.0 - 17.0 (g/dL)   HCT 11.9  14.7 - 82.9 (%)   MCV 86.5  78.0 - 100.0 (fL)   MCH 28.6  26.0 - 34.0 (pg)   MCHC 33.0  30.0 - 36.0 (g/dL)   RDW 56.2  13.0 - 86.5 (%)   Platelets 228  150 - 400 (K/uL)  DIFFERENTIAL      Component Value Range   Neutrophils Relative 81 (*) 43 - 77 (%)   Neutro Abs 14.9 (*) 1.7 - 7.7 (K/uL)   Lymphocytes Relative 12  12 - 46 (%)   Lymphs Abs 2.3  0.7 - 4.0 (K/uL)   Monocytes Relative 6  3 - 12 (%)   Monocytes Absolute 1.1 (*) 0.1 - 1.0 (K/uL)   Eosinophils Relative 1  0 - 5 (%)   Eosinophils Absolute 0.1  0.0 - 0.7 (K/uL)   Basophils Relative 0  0 - 1 (%)   Basophils Absolute 0.0  0.0 - 0.1 (K/uL)  URINALYSIS, ROUTINE W REFLEX MICROSCOPIC      Component Value Range   Color, Urine YELLOW  YELLOW    Appearance CLEAR  CLEAR    Specific Gravity, Urine 1.025  1.005 - 1.030    pH 6.5  5.0 - 8.0    Glucose, UA 250 (*) NEGATIVE (mg/dL)   Hgb urine dipstick NEGATIVE  NEGATIVE    Bilirubin Urine NEGATIVE  NEGATIVE    Ketones, ur NEGATIVE  NEGATIVE (mg/dL)   Protein, ur 784 (*) NEGATIVE (mg/dL)   Urobilinogen, UA 0.2  0.0 - 1.0 (mg/dL)   Nitrite NEGATIVE  NEGATIVE    Leukocytes, UA NEGATIVE  NEGATIVE   URINE MICROSCOPIC-ADD ON      Component Value Range   WBC, UA 3-6  <3 (WBC/hpf)   Bacteria, UA MANY (*) RARE    Ct Abdomen Pelvis Wo Contrast  12/11/2010  *RADIOLOGY REPORT*  Clinical Data: Right testicular pain, right groin pain radiating to abdomen, right back pain, history kidney stones, diabetes, stroke, hypertension  CT ABDOMEN AND PELVIS WITHOUT CONTRAST   Technique:  Multidetector CT imaging of the abdomen and pelvis was performed following the standard protocol without intravenous contrast. Sagittal and coronal MPR images reconstructed from axial data set.  Comparison: 11/08/2006  Findings: 7 mm diameter nodule at base of lingula, stable, image 1. 7 mm diameter right middle lobe nodule image 2, stable. Minimal bibasilar atelectasis. Question additional tiny nodules at left lung base, 2  mm range, images 2 and 5. Peripelvic cyst at bilateral kidneys. Tiny calcifications of right kidney may be vascular in origin or nonobstructing calculi. Ureters normal in caliber without calcification. Bladder unremarkable. Enlargement prostate gland, 5.2 x 4.1 cm image 96. Within limits of a nonenhanced exam no focal abnormalities of liver, spleen, pancreas, or adrenal glands otherwise seen. Small stable calcification right adrenal gland unchanged. Diverticulosis of sigmoid colon without surrounding inflammatory changes. Normal appendix. Rectum incompletely distended, unable to exclude rectal wall thickening. Bowel loops otherwise unremarkable. No mass, adenopathy, free fluid or hernia. No acute osseous findings. Bones appear demineralized.  IMPRESSION: Sigmoid diverticulosis without evidence of diverticulitis. Questionable tiny non-obstructing right renal calculi. Stable lung base nodules. Peripelvic renal cysts. Prostatic enlargement. Unable to exclude rectal wall thickening due to underdistension.  Original Report Authenticated By: Lollie Marrow, M.D.   US Scrotum  12/11/2010  *RADIOLOGY REPORT*  Clinical Data:  Testicle pain.  SCROTAL ULTRASOUND DOPPLER ULTRASOUND OF THE TESTICLES  Technique: Complete ultrasound examination of the testicles, epididymis, and other scrotal structures was performed.  Color and spectral Doppler ultrasound were also utilized to evaluate blood flow to the testicles.  Comparison:  None  Findings:  Right testis:  5.1 x 2.6 x 3.0 cm. Increased   vascularity.  Left testis:  4.7 x 2.2 x 2.8 cm. Simple appearing 5 mm cyst in the inferior aspect of the testicle.  Right epididymis:  Increased vascularity in the epididymis.  Left epididymis:  A tiny epididymal cyst.  Otherwise normal.  Hydocele:  Bilateral small hydroceles.  Varicocele:  None  Pulsed Doppler interrogation of both testes demonstrates that there are normal arterial and venous waveforms bilaterally to the testicles.  IMPRESSION:   Increased perfusion to the right testicle and epididymis.  No discrete mass lesions.  The finding is suggestive of epididymo- orchitis.  Small bilateral hydroceles.  Original Report Authenticated By: Gwynn Burly, M.D.   [place] Art/ven Flow Abd Pelv Doppler  12/11/2010  *RADIOLOGY REPORT*  Clinical Data:  Testicle pain.  SCROTAL ULTRASOUND DOPPLER ULTRASOUND OF THE TESTICLES  Technique: Complete ultrasound examination of the testicles, epididymis, and other scrotal structures was performed.  Color and spectral Doppler ultrasound were also utilized to evaluate blood flow to the testicles.  Comparison:  None  Findings:  Right testis:  5.1 x 2.6 x 3.0 cm. Increased  vascularity.  Left testis:  4.7 x 2.2 x 2.8 cm. Simple appearing 5 mm cyst in the inferior aspect of the testicle.  Right epididymis:  Increased vascularity in the epididymis.  Left epididymis:  A tiny epididymal cyst.  Otherwise normal.  Hydocele:  Bilateral small hydroceles.  Varicocele:  None  Pulsed Doppler interrogation of both testes demonstrates that there are normal arterial and venous waveforms bilaterally to the testicles.  IMPRESSION:   Increased perfusion to the right testicle and epididymis.  No discrete mass lesions.  The finding is suggestive of epididymo- orchitis.  Small bilateral hydroceles.  Original Report Authenticated By: Gwynn Burly, M.D.     Chart written by Clarita Crane acting as scribe for [name], MD  I personally performed the services described in this  documentation, which was scribed in my presence. The recorded information has been reviewed and considered.   [name], MD 12/11/10 [phone]

## 2010-12-11 NOTE — ED Notes (Signed)
Pt presents with right testicular pain that radiates up to abdominal area and around to right side of low back. Pt states symptoms started 3 days ago. Pt denies n/v. NAD at this time. Pt to triage via w/c.

## 2010-12-11 NOTE — ED Notes (Signed)
Patient is resting comfortably. 

## 2011-01-26 ENCOUNTER — Ambulatory Visit (INDEPENDENT_AMBULATORY_CARE_PROVIDER_SITE_OTHER): Payer: Medicare Other | Admitting: Urology

## 2011-01-26 ENCOUNTER — Other Ambulatory Visit: Payer: Self-pay | Admitting: Urology

## 2011-01-26 DIAGNOSIS — R3915 Urgency of urination: Secondary | ICD-10-CM

## 2011-01-26 DIAGNOSIS — N5089 Other specified disorders of the male genital organs: Secondary | ICD-10-CM

## 2011-01-26 DIAGNOSIS — N509 Disorder of male genital organs, unspecified: Secondary | ICD-10-CM

## 2011-01-26 DIAGNOSIS — R350 Frequency of micturition: Secondary | ICD-10-CM

## 2011-01-28 ENCOUNTER — Ambulatory Visit (HOSPITAL_COMMUNITY)
Admission: RE | Admit: 2011-01-28 | Discharge: 2011-01-28 | Disposition: A | Discharge status: Home / Self Care | Payer: Medicare Other | Source: Ambulatory Visit | Attending: Urology | Admitting: Urology

## 2011-01-28 ENCOUNTER — Other Ambulatory Visit (HOSPITAL_COMMUNITY): Payer: Self-pay | Admitting: Urology

## 2011-01-28 DIAGNOSIS — R3915 Urgency of urination: Secondary | ICD-10-CM

## 2011-01-28 DIAGNOSIS — R9389 Abnormal findings on diagnostic imaging of other specified body structures: Secondary | ICD-10-CM | POA: Insufficient documentation

## 2011-01-28 DIAGNOSIS — N509 Disorder of male genital organs, unspecified: Secondary | ICD-10-CM

## 2011-01-28 DIAGNOSIS — N5089 Other specified disorders of the male genital organs: Secondary | ICD-10-CM

## 2011-02-02 ENCOUNTER — Ambulatory Visit (INDEPENDENT_AMBULATORY_CARE_PROVIDER_SITE_OTHER): Payer: Medicare Other | Admitting: Urology

## 2011-02-02 DIAGNOSIS — N509 Disorder of male genital organs, unspecified: Secondary | ICD-10-CM

## 2011-02-02 DIAGNOSIS — N3941 Urge incontinence: Secondary | ICD-10-CM

## 2011-02-02 DIAGNOSIS — R3915 Urgency of urination: Secondary | ICD-10-CM

## 2011-03-05 ENCOUNTER — Encounter: Payer: Self-pay | Admitting: Thoracic Diseases

## 2011-03-11 LAB — CBC
MCV: 83.6
Platelets: 205
RDW: 13.3
WBC: 7.6

## 2011-03-11 LAB — COMPREHENSIVE METABOLIC PANEL
AST: 23
Albumin: 3.6
BUN: 12
Chloride: 105
Creatinine, Ser: 0.73
GFR calc Af Amer: >60
Total Bilirubin: 1
Total Protein: 6.7

## 2011-03-11 LAB — DIFFERENTIAL
Basophils Absolute: 0
Eosinophils Relative: 3
Lymphocytes Relative: 17
Lymphs Abs: 1.3
Monocytes Absolute: 0.4
Monocytes Relative: 5
Neutro Abs: 5.7

## 2011-03-11 LAB — URINALYSIS, ROUTINE W REFLEX MICROSCOPIC
Nitrite: NEGATIVE
Protein, ur: NEGATIVE
Specific Gravity, Urine: 1.012
Urobilinogen, UA: 0.2

## 2011-03-11 LAB — POCT CARDIAC MARKERS
CKMB, poc: <1 — ABNORMAL LOW
Myoglobin, poc: 67.8
Troponin i, poc: <0.05

## 2011-03-30 ENCOUNTER — Ambulatory Visit (INDEPENDENT_AMBULATORY_CARE_PROVIDER_SITE_OTHER): Payer: Medicare Other | Admitting: Urology

## 2011-03-30 DIAGNOSIS — N281 Cyst of kidney, acquired: Secondary | ICD-10-CM

## 2011-03-30 DIAGNOSIS — R3915 Urgency of urination: Secondary | ICD-10-CM

## 2011-03-30 DIAGNOSIS — N509 Disorder of male genital organs, unspecified: Secondary | ICD-10-CM

## 2011-04-16 ENCOUNTER — Ambulatory Visit: Payer: Self-pay

## 2011-04-16 ENCOUNTER — Other Ambulatory Visit: Payer: Self-pay

## 2011-04-21 ENCOUNTER — Ambulatory Visit (INDEPENDENT_AMBULATORY_CARE_PROVIDER_SITE_OTHER): Payer: Medicare Other | Admitting: Thoracic Diseases

## 2011-04-21 ENCOUNTER — Ambulatory Visit (INDEPENDENT_AMBULATORY_CARE_PROVIDER_SITE_OTHER): Payer: Medicare Other | Admitting: *Deleted

## 2011-04-21 ENCOUNTER — Encounter: Payer: Self-pay | Admitting: Thoracic Diseases

## 2011-04-21 VITALS — BP 165/92 | HR 69 | Resp 28 | Ht 70.0 in | Wt 234.0 lb

## 2011-04-21 DIAGNOSIS — I6529 Occlusion and stenosis of unspecified carotid artery: Secondary | ICD-10-CM

## 2011-04-21 NOTE — Patient Instructions (Signed)
Stroke Prevention Some medical conditions and some behaviors are associated with an increased chance of having a stroke. You may prevent a stroke by making healthy choices and managing medical conditions. You can reduce your risk of having a stroke by:  Staying physically active. It is recommended that you get at least 30 minutes of activity on most or all days.   Stop smoking.   Limiting alcohol use. Moderate alcohol use is considered to be: No more than 2 drinks per day for men. No more than 1 drink per day for non-pregnant women.   DIET.  A low fat, low cholesterol, low salt and high fiber diet with servings of fruits and vegetables daily will help .   Managing your cholesterol levels.. Take any prescribed medications to control cholesterol as directed by your caregiver.   Managing your diabetes. Diabetic diet as recommended by the ADA. Take any prescribed medications to control diabetes as directed by your caregiver.   Controlling your high blood pressure (hypertension). It is very important to take any prescribed medications to control hypertension .   Aspirin: Take Aspirin daily as prescribed by your physician. Do not take aspirin with blood thinners unless prescribed by your Physician  You may be on "blood thinners" (anticoagulants) Coumadin, Plavix, Agrennox, Effient. All these medications are helpful in reducing the risk of forming abnormal blood clots. If you have the irregular heart rhythm of atrial fibrillation, you may be on a blood thinner.  Be sure you understand all your medication instructions.   SIGNS OF STROKE: SEEK IMMEDIATE MEDICAL CARE IF: You have sudden weakness or numbness of the face, arm, or leg, especially on 1 side of the body.  You have sudden confusion.  You have trouble speaking (aphasia) or understanding.  You have sudden trouble seeing in 1 or both eyes.  You have sudden trouble walking.  You have dizziness.  You have a loss of balance or coordination.   You have a sudden severe headache with no known cause.    You have new chest pain, angina, or an irregular heartbeat.   ANY OF THESE SYMPTOMS MAY REPRESENT A SERIOUS PROBLEM THAT IS AN EMERGENCY. Do not wait to see if the symptoms will go away. Get medical help right away. Call your local emergency services (911 in [place] DO NOT drive yourself to the hospital. Stroke Prevention Some medical conditions and some behaviors are associated with an increased chance of having a stroke. You may prevent a stroke by making healthy choices and managing medical conditions. You can reduce your risk of having a stroke by:  Staying physically active. It is recommended that you get at least 30 minutes of activity on most or all days.   Not smoking.   Limiting alcohol use. Moderate alcohol use is considered to be:   No more than 2 drinks per day for men.   No more than 1 drink per day for nonpregnant women.   Eating healthy foods. Certain diets may be prescribed to address high blood pressure, high cholesterol, diabetes, or obesity. A diet that includes 5 or more servings of fruits and vegetables a day may reduce the risk of stroke.   Managing your cholesterol levels. A low saturated fat, low trans fat, low cholesterol, and high fiber diet may control cholesterol levels. Take any prescribed medications to control cholesterol as directed by your caregiver.   Managing your diabetes. A controlled carbohydrate, controlled sugar diet is recommended to manage diabetes. Take any prescribed medications to   control diabetes as directed by your caregiver.   Controlling your high blood pressure (hypertension). A low salt (sodium), low saturated fat, low trans fat, and low cholesterol diet is recommended to manage high blood pressure. Take any prescribed medications to control hypertension as directed by your caregiver.   Maintaining a healthy weight. A reduced calorie, low sodium, low saturated fat, low trans fat, low  cholesterol diet is recommended to manage weight.   Stopping drug abuse.   Taking medications as directed by your caregiver. For some individuals, aspirin or "blood thinners" (anticoagulants) are helpful in reducing the risk of forming abnormal blood clots that can lead to stroke. If you have the irregular heart rhythm of atrial fibrillation, you should be on a blood thinner unless there is a good reason you cannot take them. Be sure you understand all your medication instructions.  SEEK IMMEDIATE MEDICAL CARE IF:  You have sudden weakness or numbness of the face, arm, or leg, especially on 1 side of the body.   You have sudden confusion.   You have trouble speaking (aphasia) or understanding.   You have sudden trouble seeing in 1 or both eyes.   You have sudden trouble walking.   You have a loss of balance or coordination.   You have a sudden severe headache with no known cause.    ANY OF THESE SYMPTOMS MAY REPRESENT A SERIOUS PROBLEM THAT IS AN EMERGENCY. Do not wait to see if the symptoms will go away. Get medical help right away. Call your local emergency services (911 in [place] DO NOT drive yourself to the hospital. Document Released: 06/17/2004 Document Re-Released: 10/28/2009 [employer] Patient Information 2011 [employer].  

## 2011-04-21 NOTE — Progress Notes (Signed)
Established Carotid Patient   History of Present Illness  George Cook is a 77 y.o. male who has known carotid stenosis.  Patient has not had previous  CEA.  Patient has Negative history of TIA or stroke symptom.  The patient denies amaurosis fugax or monocular blindness.  The patient  denies facial drooping.  Pt. denies hemiplegia.  The patient denies receptive or expressive aphasia.  Pt. 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  These findings are Unchanged from previous exam  Previous angiogram: No:   Past Medical History  Diagnosis Date  . HTN (hypertension)   . DM (diabetes mellitus)   . Kidney stones   . Carotid artery occlusion   . Stroke   . Obesity     Social History History  Substance Use Topics  . Smoking status: Former Smoker -- 1.5 packs/day for 40 years    Types: Cigarettes    Quit date: 05/25/1991  . Smokeless tobacco: Not on file  . Alcohol Use: No    Family History Family History  Problem Relation Age of Onset  . Diabetes Mother   . Stroke Mother   . Coronary artery disease Father   . Stroke Sister   . Diabetes Sister   . Coronary artery disease Brother     Review of Systems : [x]  Positive   [ ]  Negative  General:[ ]  Weight loss,  [ ]  Weight gain, [ ]  Loss of appetite, [ ]  Fever, [ ]  chills  Neurologic: [ ]  Dizziness, [ ]  Blackouts, [ ]  Headaches, [ ]  Seizure [x ] Stroke, [ ]  "Mini stroke", [ ]  Slurred speech, [ ]  Temporary blindness;  [ ] weakness,  Ear/Nose/Throat: [ ]  Change in hearing, [ ]  Nose bleeds, [ ]   Hoarseness  Vascular:[ ]  Pain in legs with walking, [ ]  Pain in feet while lying flat , [ ]   Non-healing ulcer, [ ]  Blood clot in vein,    Pulmonary: [ ]  Home oxygen, [ ]   Productive cough, [ ]  Bronchitis, [ ]  Coughing up blood,  [ ]  Asthma, [ ]  Wheezing  Musculoskeletal:  [ ]  Arthritis, [x ] Joint pain, [ ]  low back pain  Cardiac: [ ]  Chest pain, [ ]  Shortness of breath when lying flat, [x ] Shortness of breath with exertion, [ ]  Palpitations, [ ]  Heart murmur, [ ]   Atrial fibrillation  Hematologic:[ ]  Easy Bruising, [ ]  Anemia; [ ]  Hepatitis  Psychiatric: [ ]   Depression, [ ]  Anxiety   Gastrointestinal: [ ]  Black stool, [ ]  Blood in stool, [ ]  Peptic ulcer disease,  [ ]  Gastroesophageal Reflux, [ ]  Trouble swallowing, [ ]  Diarrhea, [ ]  Constipation  Urinary: [ ]  chronic Kidney disease, [ ]  on HD, [ ]  Burning with urination, [ ]  Frequent urination, [ ]  Difficulty urinating;   Skin: [x ] Rashes, [ ]  Wounds   Allergies  Allergen Reactions  . Aspirin     Current Outpatient Prescriptions  Medication Sig Dispense Refill  . acetaminophen (TYLENOL) 325 MG tablet Take 650 mg  by mouth every 6 (six) hours as needed.        . Ascorbic Acid (VITAMIN C) 1000 MG tablet Take 1,000 mg by mouth daily.        . bumetanide (BUMEX) 2 MG tablet Take 2 mg by mouth daily.        . clopidogrel (PLAVIX) 75 MG tablet Take 75 mg by mouth daily.        . diphenoxylate-atropine (LOMOTIL) 2.5-0.025 MG per tablet Take 1 tablet by mouth 4 (four) times daily as needed.        Marland Kitchen glimepiride (AMARYL) 2 MG tablet Take 2 mg by mouth daily before breakfast.        . Multiple Vitamin (MULTIVITAMIN) capsule Take 1 capsule by mouth daily.        . Potassium 99 MG TABS Take by mouth.        . solifenacin (VESICARE) 10 MG tablet Take 10 mg by mouth daily.        Marland Kitchen atorvastatin (LIPITOR) 40 MG tablet Take 40 mg by mouth daily.        . cholestyramine (QUESTRAN) 4 GM/DOSE powder Take by mouth 3 (three) times daily with  meals.        . fish oil-omega-3 fatty acids 1000 MG capsule Take 1 g by mouth daily.        . hydrocortisone (ANUSOL-HC) 25 MG suppository Place 25 mg rectally 2 (two) times daily.        Marland Kitchen lisinopril (PRINIVIL,ZESTRIL) 10 MG tablet Take 10 mg by mouth daily.        . niacin (NIASPAN) 500 MG CR tablet Take 500 mg by mouth at bedtime.        . Probiotic Product (ALIGN) 4 MG CAPS Take by mouth.          Physical Examination  Filed Vitals:   04/21/11 1513  BP: 165/92  Pulse: 69  Resp: 28    General: WDWN male in NAD GAIT: normal Eyes: PERRLA Pulmonary:  CTAB, non labored breathing   Musculoskeletal: good and equal strength in BUE/BLE  Neurologic: A&O X 3; Appropriate Affect ; SENSATION ;normal; MOTOR FUNCTION: normal 5/5 strength in all tested muscle groups Speech is normal  Assessment: George Cook is a 77 y.o. male who presents with:Asymptomatic0 - 39% Bilateral ICA  stenosis The  ICA stenosis is  Unchanged from previous exam.  Plan: Follow-up in 1 years with Carotid Duplex scan and letter  Pt was given information regarding stroke symptoms and prevention  Clinic MD: CS Edilia Bo, MD

## 2011-04-22 ENCOUNTER — Ambulatory Visit: Payer: Self-pay

## 2011-04-22 ENCOUNTER — Other Ambulatory Visit: Payer: Self-pay

## 2011-04-26 NOTE — Procedures (Unsigned)
CAROTID DUPLEX EXAM  INDICATION:  Carotid disease.  HISTORY: Diabetes:  Yes. Cardiac:  No. Hypertension:  No. Smoking:  Previous. Previous Surgery:  No. CV History:  History of CVA in both hemispheres. Amaurosis Fugax No, Paresthesias No, Hemiparesis No.                                      RIGHT             LEFT Brachial systolic pressure:         150               155 Brachial Doppler waveforms:         Normal            Normal Vertebral direction of flow:        Antegrade         Antegrade DUPLEX VELOCITIES (cm/sec) CCA peak systolic                   40                47 ECA peak systolic                   42                64 ICA peak systolic                   45                91 ICA end diastolic                   12                17 PLAQUE MORPHOLOGY:                  Heterogenous      Heterogenous PLAQUE AMOUNT:                      Mild              Mild/moderate PLAQUE LOCATION:                    Proximal ICA/CCA  Proximal ICA  IMPRESSION: 1. No hemodynamically significant stenosis of the right internal     carotid artery with plaque formations as described above. 2. Left internal carotid artery velocities suggest 1% to 39% stenosis. 3. Stable in comparison to the last examination.  ___________________________________________ Quita Skye Hart [place], M.D.  EM/MEDQ  D:  04/21/2011  T:  04/21/2011  Job:  [id number]

## 2012-04-12 ENCOUNTER — Other Ambulatory Visit: Payer: Self-pay | Admitting: *Deleted

## 2012-04-12 DIAGNOSIS — I63239 Cerebral infarction due to unspecified occlusion or stenosis of unspecified carotid arteries: Secondary | ICD-10-CM

## 2012-04-18 ENCOUNTER — Encounter: Payer: Self-pay | Admitting: Neurosurgery

## 2012-04-19 ENCOUNTER — Other Ambulatory Visit: Payer: Medicare Other

## 2012-04-19 ENCOUNTER — Ambulatory Visit: Payer: Medicare Other | Admitting: Neurosurgery

## 2012-04-25 ENCOUNTER — Ambulatory Visit (INDEPENDENT_AMBULATORY_CARE_PROVIDER_SITE_OTHER): Payer: Medicare Other | Admitting: Urology

## 2012-04-25 DIAGNOSIS — N433 Hydrocele, unspecified: Secondary | ICD-10-CM

## 2012-05-30 ENCOUNTER — Ambulatory Visit: Payer: Medicare Other | Admitting: Urology

## 2012-06-06 ENCOUNTER — Encounter: Payer: Self-pay | Admitting: Neurosurgery

## 2012-06-07 ENCOUNTER — Encounter: Payer: Self-pay | Admitting: Neurosurgery

## 2012-06-07 ENCOUNTER — Ambulatory Visit (INDEPENDENT_AMBULATORY_CARE_PROVIDER_SITE_OTHER): Payer: Medicare Other | Admitting: Neurosurgery

## 2012-06-07 ENCOUNTER — Ambulatory Visit (INDEPENDENT_AMBULATORY_CARE_PROVIDER_SITE_OTHER): Payer: Medicare Other | Admitting: Vascular Surgery

## 2012-06-07 VITALS — BP 144/84 | HR 70 | Resp 16 | Ht 70.0 in | Wt 226.0 lb

## 2012-06-07 DIAGNOSIS — I6529 Occlusion and stenosis of unspecified carotid artery: Secondary | ICD-10-CM | POA: Insufficient documentation

## 2012-06-07 DIAGNOSIS — I63239 Cerebral infarction due to unspecified occlusion or stenosis of unspecified carotid arteries: Secondary | ICD-10-CM

## 2012-06-07 NOTE — Progress Notes (Signed)
Carotid duplex performed @ [facility] 06/07/2012

## 2012-06-07 NOTE — Progress Notes (Signed)
[facility] [place] Carotid Office Note  CC: Carotid surveillance Referring Physician: Hart Rochester  History of Present Illness: 79 year old male patient of Dr. Hart Rochester followed for known carotid stenosis. The patient denies any signs or symptoms of CVA, TIA, amaurosis fugax or any neural deficit. The patient does state he has cataract surgery pending.  Past Medical History  Diagnosis Date  . HTN (hypertension)   . DM (diabetes mellitus)   . Kidney stones   . Carotid artery occlusion   . Stroke   . Obesity     ROS: [x]  Positive   [ ]  Denies    General: [ ]  Weight loss, [ ]  Fever, [ ]  chills Neurologic: [ ]  Dizziness, [ ]  Blackouts, [ ]  Seizure [ ]  Stroke, [ ]  "Mini stroke", [ ]  Slurred speech, [ ]  Temporary blindness; [ ]  weakness in arms or legs, [ ]  Hoarseness Cardiac: [ ]  Chest pain/pressure, [ ]  Shortness of breath at rest [ ]  Shortness of breath with exertion, [ ]  Atrial fibrillation or irregular heartbeat Vascular: [ ]  Pain in legs with walking, [ ]  Pain in legs at rest, [ ]  Pain in legs at night,  [ ]  Non-healing ulcer, [ ]  Blood clot in vein/DVT,   Pulmonary: [ ]  Home oxygen, [ ]  Productive cough, [ ]  Coughing up blood, [ ]  Asthma,  [ ]  Wheezing Musculoskeletal:  [ ]  Arthritis, [ ]  Low back pain, [ ]  Joint pain Hematologic: [ ]  Easy Bruising, [ ]  Anemia; [ ]  Hepatitis Gastrointestinal: [ ]  Blood in stool, [ ]  Gastroesophageal Reflux/heartburn, [ ]  Trouble swallowing Urinary: [ ]  chronic Kidney disease, [ ]  on HD - [ ]  MWF or [ ]  TTHS, [ ]  Burning with urination, [ ]  Difficulty urinating Skin: [ ]  Rashes, [ ]  Wounds Psychological: [ ]  Anxiety, [ ]  Depression   Social History History  Substance Use Topics  . Smoking status: Former Smoker -- 1.5 packs/day for 40 years    Types: Cigarettes    Quit date: 05/25/1991  . Smokeless tobacco: Not on file  . Alcohol Use: No    Family History Family History  Problem Relation Age of Onset  . Diabetes Mother     . Stroke Mother   . Coronary artery disease Father   . Stroke Sister   . Diabetes Sister   . Coronary artery disease Brother     Allergies  Allergen Reactions  . Aspirin     Current Outpatient Prescriptions  Medication Sig Dispense Refill  . acetaminophen (TYLENOL) 325 MG tablet Take 650 mg by mouth every 6 (six) hours as needed.        . Ascorbic Acid (VITAMIN C) 1000 MG tablet Take 1,000 mg by mouth daily.        Marland Kitchen atorvastatin (LIPITOR) 40 MG tablet Take 40 mg by mouth daily.        . bumetanide (BUMEX) 2 MG tablet Take 2 mg by mouth daily.        . cholestyramine (QUESTRAN) 4 GM/DOSE powder Take by mouth 3 (three) times daily with meals.        . clopidogrel (PLAVIX) 75 MG tablet Take 75 mg by mouth daily.        . diphenoxylate-atropine (LOMOTIL) 2.5-0.025 MG per tablet Take 1 tablet by mouth 4 (four) times daily as needed.        . fish oil-omega-3 fatty acids 1000 MG capsule Take 1 g by mouth daily.        Marland Kitchen  glimepiride (AMARYL) 2 MG tablet Take 2 mg by mouth daily before breakfast.        . hydrocortisone (ANUSOL-HC) 25 MG suppository Place 25 mg rectally 2 (two) times daily.        Marland Kitchen lisinopril (PRINIVIL,ZESTRIL) 10 MG tablet Take 10 mg by mouth daily.        . Multiple Vitamin (MULTIVITAMIN) capsule Take 1 capsule by mouth daily.        . niacin (NIASPAN) 500 MG CR tablet Take 500 mg by mouth at bedtime.        . Potassium 99 MG TABS Take by mouth.        . Probiotic Product (ALIGN) 4 MG CAPS Take by mouth.        . solifenacin (VESICARE) 10 MG tablet Take 10 mg by mouth daily.          Physical Examination  Filed Vitals:   06/07/12 1421  BP: 147/85  Pulse: 71  Resp: 16    Body mass index is 32.43 kg/(m^2).  General:  WDWN in NAD Gait: Normal HEENT: WNL Eyes: Pupils equal Pulmonary: normal non-labored breathing , without Rales, rhonchi,  wheezing Cardiac: RRR, without  Murmurs, rubs or gallops; Abdomen: soft, NT, no masses Skin: no rashes, ulcers  noted  Vascular Exam Pulses: 3+ radial pulses bilaterally Carotid bruits: Carotid pulses to auscultation no bruits are heard Extremities without ischemic changes, no Gangrene , no cellulitis; no open wounds;  Musculoskeletal: no muscle wasting or atrophy   Neurologic: A&O X 3; Appropriate Affect ; SENSATION: normal; MOTOR FUNCTION:  moving all extremities equally. Speech is fluent/normal  Non-Invasive Vascular Imaging CAROTID DUPLEX 06/07/2012  Right ICA 20 - 39 % stenosis Left ICA 20 - 39 % stenosis   ASSESSMENT/PLAN: Asymptomatic patient with unchanged exam from previous. The patient will followup in one year with repeat carotid duplex. The patient's questions were encouraged and answered, he is in agreement with this plan.  Lauree Chandler ANP   Clinic MD: Edilia Bo

## 2012-06-08 ENCOUNTER — Encounter: Payer: Self-pay | Admitting: Neurosurgery

## 2012-06-08 ENCOUNTER — Other Ambulatory Visit: Payer: Self-pay | Admitting: *Deleted

## 2012-06-08 DIAGNOSIS — I6529 Occlusion and stenosis of unspecified carotid artery: Secondary | ICD-10-CM

## 2012-06-08 DIAGNOSIS — Z48812 Encounter for surgical aftercare following surgery on the circulatory system: Secondary | ICD-10-CM

## 2012-08-07 ENCOUNTER — Ambulatory Visit (HOSPITAL_COMMUNITY)
Admission: RE | Admit: 2012-08-07 | Discharge: 2012-08-07 | Disposition: A | Discharge status: Home / Self Care | Payer: Medicare Other | Source: Ambulatory Visit | Attending: Pulmonary Disease | Admitting: Pulmonary Disease

## 2012-08-07 ENCOUNTER — Other Ambulatory Visit (HOSPITAL_COMMUNITY): Payer: Self-pay | Admitting: Pulmonary Disease

## 2012-08-07 DIAGNOSIS — E119 Type 2 diabetes mellitus without complications: Secondary | ICD-10-CM | POA: Insufficient documentation

## 2012-08-07 DIAGNOSIS — M51379 Other intervertebral disc degeneration, lumbosacral region without mention of lumbar back pain or lower extremity pain: Secondary | ICD-10-CM | POA: Insufficient documentation

## 2012-08-07 DIAGNOSIS — M5137 Other intervertebral disc degeneration, lumbosacral region: Secondary | ICD-10-CM | POA: Insufficient documentation

## 2012-08-07 DIAGNOSIS — R059 Cough, unspecified: Secondary | ICD-10-CM | POA: Insufficient documentation

## 2012-08-07 DIAGNOSIS — M545 Low back pain, unspecified: Secondary | ICD-10-CM | POA: Insufficient documentation

## 2012-08-07 DIAGNOSIS — I10 Essential (primary) hypertension: Secondary | ICD-10-CM | POA: Insufficient documentation

## 2012-09-18 ENCOUNTER — Encounter (HOSPITAL_COMMUNITY): Payer: Self-pay | Admitting: Pharmacy Technician

## 2012-09-22 NOTE — Patient Instructions (Addendum)
Your procedure is scheduled on: 10/02/2012  Report to [facility] at  830  AM.  Call this number if you have problems the morning of surgery: [phone]   Do not eat food or drink liquids :After Midnight.      Take these medicines the morning of surgery with A SIP OF WATER: none   Do not wear jewelry, make-up or nail polish.  Do not wear lotions, powders, or perfumes.   Do not shave 48 hours prior to surgery.  Do not bring valuables to the hospital.  Contacts, dentures or bridgework may not be worn into surgery.  Leave suitcase in the car. After surgery it may be brought to your room.  For patients admitted to the hospital, checkout time is 11:00 AM the day of discharge.   Patients discharged the day of surgery will not be allowed to drive home.  :     Please read over the following fact sheets that you were given: Coughing and Deep Breathing, Surgical Site Infection Prevention, Anesthesia Post-op Instructions and Care and Recovery After Surgery    Cataract A cataract is a clouding of the lens of the eye. When a lens becomes cloudy, vision is reduced based on the degree and nature of the clouding. Many cataracts reduce vision to some degree. Some cataracts make people more near-sighted as they develop. Other cataracts increase glare. Cataracts that are ignored and become worse can sometimes look white. The white color can be seen through the pupil. CAUSES   Aging. However, cataracts may occur at any age, even in newborns.   Certain drugs.   Trauma to the eye.   Certain diseases such as diabetes.   Specific eye diseases such as chronic inflammation inside the eye or a sudden attack of a rare form of glaucoma.   Inherited or acquired medical problems.  SYMPTOMS   Gradual, progressive drop in vision in the affected eye.   Severe, rapid visual loss. This most often happens when trauma is the cause.  DIAGNOSIS  To detect a cataract, an eye doctor examines the lens. Cataracts are  best diagnosed with an exam of the eyes with the pupils enlarged (dilated) by drops.  TREATMENT  For an early cataract, vision may improve by using different eyeglasses or stronger lighting. If that does not help your vision, surgery is the only effective treatment. A cataract needs to be surgically removed when vision loss interferes with your everyday activities, such as driving, reading, or watching TV. A cataract may also have to be removed if it prevents examination or treatment of another eye problem. Surgery removes the cloudy lens and usually replaces it with a substitute lens (intraocular lens, IOL).  At a time when both you and your doctor agree, the cataract will be surgically removed. If you have cataracts in both eyes, only one is usually removed at a time. This allows the operated eye to heal and be out of danger from any possible problems after surgery (such as infection or poor wound healing). In rare cases, a cataract may be doing damage to your eye. In these cases, your caregiver may advise surgical removal right away. The vast majority of people who have cataract surgery have better vision afterward. HOME CARE INSTRUCTIONS  If you are not planning surgery, you may be asked to do the following:  Use different eyeglasses.   Use stronger or brighter lighting.   Ask your eye doctor about reducing your medicine dose or changing medicines if  it is thought that a medicine caused your cataract. Changing medicines does not make the cataract go away on its own.   Become familiar with your surroundings. Poor vision can lead to injury. Avoid bumping into things on the affected side. You are at a higher risk for tripping or falling.   Exercise extreme care when driving or operating machinery.   Wear sunglasses if you are sensitive to bright light or experiencing problems with glare.  SEEK IMMEDIATE MEDICAL CARE IF:   You have a worsening or sudden vision loss.   You notice redness,  swelling, or increasing pain in the eye.   You have a fever.  Document Released: 05/10/2005 Document Revised: 04/29/2011 Document Reviewed: 01/01/2011 [facility] Patient Information 2012 [place].PATIENT INSTRUCTIONS POST-ANESTHESIA  IMMEDIATELY FOLLOWING SURGERY:  Do not drive or operate machinery for the first twenty four hours after surgery.  Do not make any important decisions for twenty four hours after surgery or while taking narcotic pain medications or sedatives.  If you develop intractable nausea and vomiting or a severe headache please notify your doctor immediately.  FOLLOW-UP:  Please make an appointment with your surgeon as instructed. You do not need to follow up with anesthesia unless specifically instructed to do so.  WOUND CARE INSTRUCTIONS (if applicable):  Keep a dry clean dressing on the anesthesia/puncture wound site if there is drainage.  Once the wound has quit draining you may leave it open to air.  Generally you should leave the bandage intact for twenty four hours unless there is drainage.  If the epidural site drains for more than 36-48 hours please call the anesthesia department.  QUESTIONS?:  Please feel free to call your physician or the hospital operator if you have any questions, and they will be happy to assist you.

## 2012-09-25 ENCOUNTER — Encounter (HOSPITAL_COMMUNITY): Payer: Self-pay

## 2012-09-25 ENCOUNTER — Encounter (HOSPITAL_COMMUNITY)
Admission: RE | Admit: 2012-09-25 | Discharge: 2012-09-25 | Disposition: A | Discharge status: Home / Self Care | Payer: Medicare Other | Source: Ambulatory Visit | Attending: Ophthalmology | Admitting: Ophthalmology

## 2012-09-25 HISTORY — DX: Unspecified macular degeneration: H35.30

## 2012-09-25 LAB — BASIC METABOLIC PANEL
Calcium: 9.1 mg/dL (ref 8.4–10.5)
Creatinine, Ser: 1.01 mg/dL (ref 0.50–1.35)
GFR calc Af Amer: 78 mL/min — ABNORMAL LOW (ref >90)

## 2012-09-25 LAB — HEMOGLOBIN AND HEMATOCRIT, BLOOD
HCT: 39.8 % (ref 39.0–52.0)
Hemoglobin: 13.6 g/dL (ref 13.0–17.0)

## 2012-09-29 MED ORDER — CYCLOPENTOLATE-PHENYLEPHRINE 0.2-1 % OP SOLN
OPHTHALMIC | Status: AC
  Filled 2012-09-29: qty 2

## 2012-09-29 MED ORDER — LIDOCAINE HCL 3.5 % OP GEL
OPHTHALMIC | Status: AC
  Filled 2012-09-29: qty 5

## 2012-09-29 MED ORDER — LIDOCAINE HCL (PF) 1 % IJ SOLN
INTRAMUSCULAR | Status: AC
  Filled 2012-09-29: qty 2

## 2012-09-29 MED ORDER — PHENYLEPHRINE HCL 2.5 % OP SOLN
OPHTHALMIC | Status: AC
  Filled 2012-09-29: qty 2

## 2012-09-29 MED ORDER — TETRACAINE HCL 0.5 % OP SOLN
OPHTHALMIC | Status: AC
  Filled 2012-09-29: qty 2

## 2012-10-02 ENCOUNTER — Ambulatory Visit (HOSPITAL_COMMUNITY)
Admission: RE | Admit: 2012-10-02 | Discharge: 2012-10-02 | Disposition: A | Discharge status: Home / Self Care | Payer: Medicare Other | Source: Ambulatory Visit | Attending: Ophthalmology | Admitting: Ophthalmology

## 2012-10-02 ENCOUNTER — Encounter (HOSPITAL_COMMUNITY)
Admission: RE | Disposition: A | Discharge status: Home / Self Care | Payer: Self-pay | Source: Ambulatory Visit | Attending: Ophthalmology

## 2012-10-02 ENCOUNTER — Encounter (HOSPITAL_COMMUNITY): Payer: Self-pay | Admitting: *Deleted

## 2012-10-02 ENCOUNTER — Ambulatory Visit (HOSPITAL_COMMUNITY): Payer: Medicare Other | Admitting: Anesthesiology

## 2012-10-02 ENCOUNTER — Encounter (HOSPITAL_COMMUNITY): Payer: Self-pay | Admitting: Anesthesiology

## 2012-10-02 DIAGNOSIS — E119 Type 2 diabetes mellitus without complications: Secondary | ICD-10-CM | POA: Insufficient documentation

## 2012-10-02 DIAGNOSIS — I10 Essential (primary) hypertension: Secondary | ICD-10-CM | POA: Insufficient documentation

## 2012-10-02 DIAGNOSIS — H2510 Age-related nuclear cataract, unspecified eye: Secondary | ICD-10-CM | POA: Insufficient documentation

## 2012-10-02 DIAGNOSIS — Z01810 Encounter for preprocedural cardiovascular examination: Secondary | ICD-10-CM | POA: Insufficient documentation

## 2012-10-02 DIAGNOSIS — Z01812 Encounter for preprocedural laboratory examination: Secondary | ICD-10-CM | POA: Insufficient documentation

## 2012-10-02 HISTORY — PX: CATARACT EXTRACTION W/PHACO: SHX586

## 2012-10-02 SURGERY — PHACOEMULSIFICATION, CATARACT, WITH IOL INSERTION
Anesthesia: Monitor Anesthesia Care | Site: Eye | Laterality: Right | Wound class: Clean

## 2012-10-02 MED ORDER — EPINEPHRINE HCL 1 MG/ML IJ SOLN
INTRAOCULAR | Status: DC | PRN
  Administered 2012-10-02: 10:00:00

## 2012-10-02 MED ORDER — EPINEPHRINE HCL 1 MG/ML IJ SOLN
INTRAMUSCULAR | Status: AC
  Filled 2012-10-02: qty 1

## 2012-10-02 MED ORDER — SODIUM HYALURONATE 10 MG/ML IO SOLN: INTRAOCULAR | Status: DC | PRN

## 2012-10-02 MED ORDER — LIDOCAINE HCL 3.5 % OP GEL
1.0000 "application " | Freq: Once | OPHTHALMIC | Status: AC
  Administered 2012-10-02: 1 via OPHTHALMIC

## 2012-10-02 MED ORDER — MIDAZOLAM HCL 2 MG/2ML IJ SOLN
1.0000 mg | INTRAMUSCULAR | Status: DC | PRN
  Administered 2012-10-02: 2 mg via INTRAVENOUS

## 2012-10-02 MED ORDER — ONDANSETRON HCL 4 MG/2ML IJ SOLN: 4.0000 mg | Freq: Once | INTRAMUSCULAR | Status: DC | PRN

## 2012-10-02 MED ORDER — MIDAZOLAM HCL 2 MG/2ML IJ SOLN
INTRAMUSCULAR | Status: AC
  Filled 2012-10-02: qty 2

## 2012-10-02 MED ORDER — LACTATED RINGERS IV SOLN
INTRAVENOUS | Status: DC
  Administered 2012-10-02: 09:00:00 via INTRAVENOUS

## 2012-10-02 MED ORDER — SODIUM HYALURONATE 10 MG/ML IO SOLN
INTRAOCULAR | Status: DC | PRN
  Administered 2012-10-02: 0.85 mL via INTRAOCULAR

## 2012-10-02 MED ORDER — NEOMYCIN-POLYMYXIN-DEXAMETH 0.1 % OP OINT
TOPICAL_OINTMENT | OPHTHALMIC | Status: DC | PRN
  Administered 2012-10-02: 1 via OPHTHALMIC

## 2012-10-02 MED ORDER — PHENYLEPHRINE HCL 2.5 % OP SOLN
1.0000 [drp] | OPHTHALMIC | Status: AC
  Administered 2012-10-02 (×3): 1 [drp] via OPHTHALMIC

## 2012-10-02 MED ORDER — FENTANYL CITRATE 0.05 MG/ML IJ SOLN: 25.0000 ug | INTRAMUSCULAR | Status: DC | PRN

## 2012-10-02 MED ORDER — POVIDONE-IODINE 5 % OP SOLN
OPHTHALMIC | Status: DC | PRN
  Administered 2012-10-02: 1 via OPHTHALMIC

## 2012-10-02 MED ORDER — CYCLOPENTOLATE-PHENYLEPHRINE 0.2-1 % OP SOLN
1.0000 [drp] | OPHTHALMIC | Status: AC
  Administered 2012-10-02 (×3): 1 [drp] via OPHTHALMIC

## 2012-10-02 MED ORDER — BSS IO SOLN
INTRAOCULAR | Status: DC | PRN
  Administered 2012-10-02: 15 mL via INTRAOCULAR

## 2012-10-02 MED ORDER — LIDOCAINE HCL (PF) 1 % IJ SOLN
INTRAMUSCULAR | Status: DC | PRN
  Administered 2012-10-02: .5 mL

## 2012-10-02 MED ORDER — TETRACAINE HCL 0.5 % OP SOLN
1.0000 [drp] | OPHTHALMIC | Status: AC
  Administered 2012-10-02 (×3): 1 [drp] via OPHTHALMIC

## 2012-10-02 SURGICAL SUPPLY — 32 items

## 2012-10-02 NOTE — Anesthesia Postprocedure Evaluation (Signed)
  Anesthesia Post-op Note  Patient: George Cook  Procedure(s) Performed: Procedure(s) with comments: CATARACT EXTRACTION PHACO AND INTRAOCULAR LENS PLACEMENT (IOC) (Right) - CDE 12.78  Patient Location: Short Stay  Anesthesia Type:MAC  Level of Consciousness: awake, alert , oriented and patient cooperative  Airway and Oxygen Therapy: Patient Spontanous Breathing  Post-op Pain: none  Post-op Assessment: Post-op Vital signs reviewed, Patient's Cardiovascular Status Stable, Respiratory Function Stable, Patent Airway, No signs of Nausea or vomiting and Pain level controlled  Post-op Vital Signs: Reviewed and stable  Complications: No apparent anesthesia complications

## 2012-10-02 NOTE — Transfer of Care (Signed)
Immediate Anesthesia Transfer of Care Note  Patient: George Cook  Procedure(s) Performed: Procedure(s) with comments: CATARACT EXTRACTION PHACO AND INTRAOCULAR LENS PLACEMENT (IOC) (Right) - CDE 12.78  Patient Location: Short Stay  Anesthesia Type:MAC  Level of Consciousness: awake, alert , oriented and patient cooperative  Airway & Oxygen Therapy: Patient Spontanous Breathing  Post-op Assessment: Report given to PACU RN, Post -op Vital signs reviewed and stable and Patient moving all extremities  Post vital signs: Reviewed and stable  Complications: No apparent anesthesia complications

## 2012-10-02 NOTE — Progress Notes (Signed)
Pt. Consented for surgery on the right eye.  Pt. Brought back to pre-op area & stated that the surgery was going to be on his left eye.  He had been using his at home prescribed eye drops on his left eye & not his right eye.  Dr. Alto Denver aware that pt. Had been using eye drops in his left eye not the right eye.  MD stated that it was ok & to proceed with surgery on his right eye as consented.  Pt. Was agreeable.

## 2012-10-02 NOTE — Anesthesia Preprocedure Evaluation (Signed)
Anesthesia Evaluation  Patient identified by MRN, date of birth, ID band Patient awake    Reviewed: Allergy & Precautions, H&P , NPO status , Patient's Chart, lab work & pertinent test results  Airway Mallampati: II      Dental  (+) Partial Upper, Partial Lower and Poor Dentition   Pulmonary shortness of breath and with exertion,    + decreased breath sounds      Cardiovascular hypertension, Pt. on medications + Peripheral Vascular Disease Rhythm:Regular Rate:Normal     Neuro/Psych TIACVA, Residual Symptoms    GI/Hepatic   Endo/Other  diabetes, Well Controlled, Type 2, Oral Hypoglycemic Agents  Renal/GU Renal disease     Musculoskeletal   Abdominal   Peds  Hematology   Anesthesia Other Findings   Reproductive/Obstetrics                           Anesthesia Physical Anesthesia Plan  ASA: III  Anesthesia Plan: MAC   Post-op Pain Management:    Induction: Intravenous  Airway Management Planned: Nasal Cannula  Additional Equipment:   Intra-op Plan:   Post-operative Plan:   Informed Consent: I have reviewed the patients History and Physical, chart, labs and discussed the procedure including the risks, benefits and alternatives for the proposed anesthesia with the patient or authorized representative who has indicated his/her understanding and acceptance.     Plan Discussed with:   Anesthesia Plan Comments:         Anesthesia Quick Evaluation

## 2012-10-02 NOTE — Op Note (Signed)
Date of Admission: 10/02/12  Date of Surgery: 10/02/12  Pre-Op Dx: Cataract  Right  Eye  Post-Op Dx: Nuclear Cataract Right  Eye, Dx Code 366.16  Surgeon: Gemma Payor, M.D.  Assistants: None  Anesthesia: Topical with MAC  Indications: Painless, progressive loss of vision with compromise of daily activities.  Surgery: Cataract Extraction with Intraocular lens Implant Right Eye  Discription: The patient had dilating drops and viscous lidocaine placed into the left eye in the pre-op holding area. After transfer to the operating room, a time out was performed. The patient was then prepped and draped. Beginning with a 75 degree blade a paracentesis port was made at the surgeon's 2 o'clock position. The anterior chamber was then filled with 2% non-preserved lidocaine. This was followed by filling the anterior chamber with Provisc. A bent cystatome needle was used to create a continuous tear capsulotomy. Hydrodissection was performed with balanced salt solution on a Fine canula. The lens nucleus was then removed using the phacoemulsification handpiece. Residual cortex was removed with the I&A handpiece. The anterior chamber and capsular bag were refilled with Provisc. A posterior chamber intraocular lens was placed into the capsular bag with it's injector. The implant was positioned with the Kuglan hook. The Provisc was then removed from the anterior chamber and capsular bag with the I&A handpiece. Stromal hydration of the main incision and paracentesis port was performed with BSS on a Fine canula. The wounds were tested for leak which was negative. The patient tolerated the procedure well. There were no operative complications. The patient was then transferred to the recovery room in stable condition.  Prosthetic device: B&L enVista, MX60, power 22.0D.  Specimen: None  EBL: None  Complications: None

## 2012-10-02 NOTE — H&P (Signed)
I have reviewed the H&P, the patient was re-examined, and I have identified no interval changes in medical condition and plan of care since the history and physical of record  

## 2012-10-04 ENCOUNTER — Encounter (HOSPITAL_COMMUNITY): Payer: Self-pay | Admitting: Ophthalmology

## 2012-10-17 ENCOUNTER — Encounter (HOSPITAL_COMMUNITY): Payer: Self-pay | Admitting: Pharmacy Technician

## 2012-10-24 ENCOUNTER — Encounter (HOSPITAL_COMMUNITY): Payer: Self-pay | Admitting: *Deleted

## 2012-10-24 ENCOUNTER — Encounter (HOSPITAL_COMMUNITY): Admission: RE | Admit: 2012-10-24 | Payer: Medicare Other | Source: Ambulatory Visit | Admitting: Ophthalmology

## 2012-10-30 ENCOUNTER — Ambulatory Visit (HOSPITAL_COMMUNITY): Payer: Medicare Other | Admitting: Anesthesiology

## 2012-10-30 ENCOUNTER — Ambulatory Visit (HOSPITAL_COMMUNITY)
Admission: RE | Admit: 2012-10-30 | Discharge: 2012-10-30 | Disposition: A | Discharge status: Home / Self Care | Payer: Medicare Other | Source: Ambulatory Visit | Attending: Ophthalmology | Admitting: Ophthalmology

## 2012-10-30 ENCOUNTER — Encounter (HOSPITAL_COMMUNITY)
Admission: RE | Disposition: A | Discharge status: Home / Self Care | Payer: Self-pay | Source: Ambulatory Visit | Attending: Ophthalmology

## 2012-10-30 ENCOUNTER — Encounter (HOSPITAL_COMMUNITY): Payer: Self-pay | Admitting: *Deleted

## 2012-10-30 ENCOUNTER — Encounter (HOSPITAL_COMMUNITY): Payer: Self-pay | Admitting: Anesthesiology

## 2012-10-30 DIAGNOSIS — E119 Type 2 diabetes mellitus without complications: Secondary | ICD-10-CM | POA: Insufficient documentation

## 2012-10-30 DIAGNOSIS — I10 Essential (primary) hypertension: Secondary | ICD-10-CM | POA: Insufficient documentation

## 2012-10-30 DIAGNOSIS — H2510 Age-related nuclear cataract, unspecified eye: Secondary | ICD-10-CM | POA: Insufficient documentation

## 2012-10-30 DIAGNOSIS — Z79899 Other long term (current) drug therapy: Secondary | ICD-10-CM | POA: Insufficient documentation

## 2012-10-30 DIAGNOSIS — Z01812 Encounter for preprocedural laboratory examination: Secondary | ICD-10-CM | POA: Insufficient documentation

## 2012-10-30 HISTORY — PX: CATARACT EXTRACTION W/PHACO: SHX586

## 2012-10-30 LAB — GLUCOSE, CAPILLARY: Glucose-Capillary: 108 mg/dL — ABNORMAL HIGH (ref 70–99)

## 2012-10-30 SURGERY — PHACOEMULSIFICATION, CATARACT, WITH IOL INSERTION
Anesthesia: Monitor Anesthesia Care | Site: Eye | Laterality: Left | Wound class: Clean

## 2012-10-30 MED ORDER — FENTANYL CITRATE 0.05 MG/ML IJ SOLN: 25.0000 ug | INTRAMUSCULAR | Status: DC | PRN

## 2012-10-30 MED ORDER — LIDOCAINE HCL 3.5 % OP GEL
1.0000 "application " | Freq: Once | OPHTHALMIC | Status: AC
  Administered 2012-10-30: 1 via OPHTHALMIC

## 2012-10-30 MED ORDER — CYCLOPENTOLATE-PHENYLEPHRINE 0.2-1 % OP SOLN
1.0000 [drp] | OPHTHALMIC | Status: AC
  Administered 2012-10-30 (×3): 1 [drp] via OPHTHALMIC

## 2012-10-30 MED ORDER — LIDOCAINE 3.5 % OP GEL OPTIME - NO CHARGE
OPHTHALMIC | Status: DC | PRN
  Administered 2012-10-30: 1 [drp] via OPHTHALMIC

## 2012-10-30 MED ORDER — NEOMYCIN-POLYMYXIN-DEXAMETH 0.1 % OP OINT
TOPICAL_OINTMENT | OPHTHALMIC | Status: DC | PRN
  Administered 2012-10-30: 1 via OPHTHALMIC

## 2012-10-30 MED ORDER — TETRACAINE HCL 0.5 % OP SOLN
1.0000 [drp] | OPHTHALMIC | Status: AC
  Administered 2012-10-30 (×3): 1 [drp] via OPHTHALMIC

## 2012-10-30 MED ORDER — LACTATED RINGERS IV SOLN
INTRAVENOUS | Status: DC
  Administered 2012-10-30: 1000 mL via INTRAVENOUS

## 2012-10-30 MED ORDER — EPINEPHRINE HCL 1 MG/ML IJ SOLN
INTRAMUSCULAR | Status: AC
  Filled 2012-10-30: qty 1

## 2012-10-30 MED ORDER — LIDOCAINE HCL (PF) 1 % IJ SOLN
INTRAMUSCULAR | Status: DC | PRN
  Administered 2012-10-30: .4 mL

## 2012-10-30 MED ORDER — PHENYLEPHRINE HCL 2.5 % OP SOLN
1.0000 [drp] | OPHTHALMIC | Status: AC
  Administered 2012-10-30 (×3): 1 [drp] via OPHTHALMIC

## 2012-10-30 MED ORDER — LIDOCAINE HCL 3.5 % OP GEL
OPHTHALMIC | Status: AC
  Filled 2012-10-30: qty 5

## 2012-10-30 MED ORDER — BSS IO SOLN
INTRAOCULAR | Status: DC | PRN
  Administered 2012-10-30: 15 mL via INTRAOCULAR

## 2012-10-30 MED ORDER — POVIDONE-IODINE 5 % OP SOLN
OPHTHALMIC | Status: DC | PRN
  Administered 2012-10-30: 1 via OPHTHALMIC

## 2012-10-30 MED ORDER — NEOMYCIN-POLYMYXIN-DEXAMETH 3.5-10000-0.1 OP OINT
TOPICAL_OINTMENT | OPHTHALMIC | Status: AC
  Filled 2012-10-30: qty 3.5

## 2012-10-30 MED ORDER — DEXTROSE 50 % IV SOLN
12.5000 g | Freq: Once | INTRAVENOUS | Status: AC
  Administered 2012-10-30: 12.5 g via INTRAVENOUS

## 2012-10-30 MED ORDER — TETRACAINE HCL 0.5 % OP SOLN
OPHTHALMIC | Status: AC
  Filled 2012-10-30: qty 2

## 2012-10-30 MED ORDER — EPINEPHRINE HCL 1 MG/ML IJ SOLN
INTRAOCULAR | Status: DC | PRN
  Administered 2012-10-30: 15:00:00

## 2012-10-30 MED ORDER — ONDANSETRON HCL 4 MG/2ML IJ SOLN: 4.0000 mg | Freq: Once | INTRAMUSCULAR | Status: DC | PRN

## 2012-10-30 MED ORDER — DEXTROSE 50 % IV SOLN
INTRAVENOUS | Status: AC
  Filled 2012-10-30: qty 50

## 2012-10-30 MED ORDER — NA CHONDROIT SULF-NA HYALURON 40-30 MG/ML IO SOLN
INTRAOCULAR | Status: DC | PRN
  Administered 2012-10-30: 0.75 mL via INTRAOCULAR

## 2012-10-30 MED ORDER — LIDOCAINE HCL (PF) 1 % IJ SOLN
INTRAMUSCULAR | Status: AC
  Filled 2012-10-30: qty 2

## 2012-10-30 MED ORDER — MIDAZOLAM HCL 2 MG/2ML IJ SOLN
1.0000 mg | INTRAMUSCULAR | Status: DC | PRN
  Administered 2012-10-30: 2 mg via INTRAVENOUS

## 2012-10-30 MED ORDER — MIDAZOLAM HCL 5 MG/5ML IJ SOLN
INTRAMUSCULAR | Status: AC
  Filled 2012-10-30: qty 5

## 2012-10-30 MED ORDER — CYCLOPENTOLATE-PHENYLEPHRINE 0.2-1 % OP SOLN
OPHTHALMIC | Status: AC
  Filled 2012-10-30: qty 2

## 2012-10-30 MED ORDER — PHENYLEPHRINE HCL 2.5 % OP SOLN
OPHTHALMIC | Status: AC
  Filled 2012-10-30: qty 2

## 2012-10-30 SURGICAL SUPPLY — 32 items

## 2012-10-30 NOTE — H&P (Signed)
I have reviewed the H&P, the patient was re-examined, and I have identified no interval changes in medical condition and plan of care since the history and physical of record  

## 2012-10-30 NOTE — Transfer of Care (Signed)
Immediate Anesthesia Transfer of Care Note  Patient: George Cook  Procedure(s) Performed: Procedure(s) with comments: CATARACT EXTRACTION PHACO AND INTRAOCULAR LENS PLACEMENT (IOC) (Left) - CDE: 19.10  Patient Location: Short Stay  Anesthesia Type:MAC  Level of Consciousness: awake, alert , oriented and patient cooperative  Airway & Oxygen Therapy: Patient Spontanous Breathing  Post-op Assessment: Report given to PACU RN and Post -op Vital signs reviewed and stable  Post vital signs: Reviewed and stable  Complications: No apparent anesthesia complications

## 2012-10-30 NOTE — Anesthesia Preprocedure Evaluation (Signed)
Anesthesia Evaluation  Patient identified by MRN, date of birth, ID band Patient awake    Reviewed: Allergy & Precautions, H&P , NPO status , Patient's Chart, lab work & pertinent test results  Airway Mallampati: II      Dental  (+) Partial Upper, Partial Lower and Poor Dentition   Pulmonary shortness of breath and with exertion,    + decreased breath sounds      Cardiovascular hypertension, Pt. on medications + Peripheral Vascular Disease Rhythm:Regular Rate:Normal     Neuro/Psych TIACVA, Residual Symptoms    GI/Hepatic   Endo/Other  diabetes, Well Controlled, Type 2, Oral Hypoglycemic Agents  Renal/GU Renal disease     Musculoskeletal   Abdominal   Peds  Hematology   Anesthesia Other Findings   Reproductive/Obstetrics                           Anesthesia Physical Anesthesia Plan  ASA: III  Anesthesia Plan: MAC   Post-op Pain Management:    Induction: Intravenous  Airway Management Planned: Nasal Cannula  Additional Equipment:   Intra-op Plan:   Post-operative Plan:   Informed Consent: I have reviewed the patients History and Physical, chart, labs and discussed the procedure including the risks, benefits and alternatives for the proposed anesthesia with the patient or authorized representative who has indicated his/her understanding and acceptance.     Plan Discussed with:   Anesthesia Plan Comments:         Anesthesia Quick Evaluation  

## 2012-10-30 NOTE — Op Note (Signed)
Date of Admission: 10/30/2012  Date of Surgery: 10/30/2012  Pre-Op Dx: Cataract  Left  Eye  Post-Op Dx: Cataract  Left  Eye,  Dx Code 366.16  Surgeon: Gemma Payor, M.D.  Assistants: None  Anesthesia: Topical with MAC  Indications: Painless, progressive loss of vision with compromise of daily activities.  Surgery: Cataract Extraction with Intraocular lens Implant Left Eye  Discription: The patient had dilating drops and viscous lidocaine placed into the left eye in the pre-op holding area. After transfer to the operating room, a time out was performed. The patient was then prepped and draped. Beginning with a 75 degree blade a paracentesis port was made at the surgeon's 2 o'clock position. The anterior chamber was then filled with 1% non-preserved lidocaine. This was followed by filling the anterior chamber with Provisc. A bent cystatome needle was used to create a continuous tear capsulotomy. Hydrodissection was performed with balanced salt solution on a Fine canula. The lens nucleus was then removed using the phacoemulsification handpiece. Residual cortex was removed with the I&A handpiece. The anterior chamber and capsular bag were refilled with Provisc. A posterior chamber intraocular lens was placed into the capsular bag with it's injector. The implant was positioned with the Kuglan hook. The Provisc was then removed from the anterior chamber and capsular bag with the I&A handpiece. Stromal hydration of the main incision and paracentesis port was performed with BSS on a Fine canula. The wounds were tested for leak which was negative. The patient tolerated the procedure well. There were no operative complications. The patient was then transferred to the recovery room in stable condition.  Complications: None  Specimen: None  EBL: None  Prosthetic device: B&L enVista, MX60, power 23.0, SN [id number].

## 2012-10-30 NOTE — Anesthesia Postprocedure Evaluation (Signed)
  Anesthesia Post-op Note  Patient: George Cook  Procedure(s) Performed: Procedure(s) with comments: CATARACT EXTRACTION PHACO AND INTRAOCULAR LENS PLACEMENT (IOC) (Left) - CDE: 19.10  Patient Location: Short Stay  Anesthesia Type:MAC  Level of Consciousness: awake, alert , oriented and patient cooperative  Airway and Oxygen Therapy: Patient Spontanous Breathing  Post-op Pain: none  Post-op Assessment: Post-op Vital signs reviewed, Patient's Cardiovascular Status Stable, Respiratory Function Stable, Patent Airway and Pain level controlled  Post-op Vital Signs: Reviewed and stable  Complications: No apparent anesthesia complications

## 2012-10-31 ENCOUNTER — Encounter (HOSPITAL_COMMUNITY): Payer: Self-pay | Admitting: Ophthalmology

## 2013-02-14 ENCOUNTER — Other Ambulatory Visit: Payer: Self-pay

## 2013-02-14 ENCOUNTER — Encounter (HOSPITAL_COMMUNITY): Payer: Self-pay | Admitting: Emergency Medicine

## 2013-02-14 ENCOUNTER — Emergency Department (HOSPITAL_COMMUNITY): Payer: Medicare Other

## 2013-02-14 ENCOUNTER — Emergency Department (HOSPITAL_COMMUNITY)
Admission: EM | Admit: 2013-02-14 | Discharge: 2013-02-14 | Disposition: A | Discharge status: Home / Self Care | Payer: Medicare Other | Attending: Emergency Medicine | Admitting: Emergency Medicine

## 2013-02-14 DIAGNOSIS — Z8669 Personal history of other diseases of the nervous system and sense organs: Secondary | ICD-10-CM | POA: Insufficient documentation

## 2013-02-14 DIAGNOSIS — E669 Obesity, unspecified: Secondary | ICD-10-CM | POA: Insufficient documentation

## 2013-02-14 DIAGNOSIS — Z8673 Personal history of transient ischemic attack (TIA), and cerebral infarction without residual deficits: Secondary | ICD-10-CM | POA: Insufficient documentation

## 2013-02-14 DIAGNOSIS — Z87891 Personal history of nicotine dependence: Secondary | ICD-10-CM | POA: Insufficient documentation

## 2013-02-14 DIAGNOSIS — I10 Essential (primary) hypertension: Secondary | ICD-10-CM | POA: Insufficient documentation

## 2013-02-14 DIAGNOSIS — J9801 Acute bronchospasm: Secondary | ICD-10-CM

## 2013-02-14 DIAGNOSIS — E119 Type 2 diabetes mellitus without complications: Secondary | ICD-10-CM | POA: Insufficient documentation

## 2013-02-14 DIAGNOSIS — Z79899 Other long term (current) drug therapy: Secondary | ICD-10-CM | POA: Insufficient documentation

## 2013-02-14 DIAGNOSIS — Z87442 Personal history of urinary calculi: Secondary | ICD-10-CM | POA: Insufficient documentation

## 2013-02-14 DIAGNOSIS — J40 Bronchitis, not specified as acute or chronic: Secondary | ICD-10-CM

## 2013-02-14 DIAGNOSIS — R6883 Chills (without fever): Secondary | ICD-10-CM | POA: Insufficient documentation

## 2013-02-14 DIAGNOSIS — J209 Acute bronchitis, unspecified: Secondary | ICD-10-CM | POA: Insufficient documentation

## 2013-02-14 DIAGNOSIS — R5381 Other malaise: Secondary | ICD-10-CM | POA: Insufficient documentation

## 2013-02-14 LAB — CBC WITH DIFFERENTIAL/PLATELET
Hemoglobin: 14.9 g/dL (ref 13.0–17.0)
Lymphocytes Relative: 14 % (ref 12–46)
Lymphs Abs: 1.4 10*3/uL (ref 0.7–4.0)
MCH: 28 pg (ref 26.0–34.0)
MCV: 87 fL (ref 78.0–100.0)
Monocytes Relative: 5 % (ref 3–12)
Neutrophils Relative %: 80 % — ABNORMAL HIGH (ref 43–77)
Platelets: 229 10*3/uL (ref 150–400)
RBC: 5.32 MIL/uL (ref 4.22–5.81)
WBC: 10.4 10*3/uL (ref 4.0–10.5)

## 2013-02-14 LAB — COMPREHENSIVE METABOLIC PANEL
ALT: 20 U/L (ref 0–53)
Alkaline Phosphatase: 36 U/L — ABNORMAL LOW (ref 39–117)
BUN: 28 mg/dL — ABNORMAL HIGH (ref 6–23)
CO2: 31 mEq/L (ref 19–32)
GFR calc Af Amer: 77 mL/min — ABNORMAL LOW (ref >90)
GFR calc non Af Amer: 67 mL/min — ABNORMAL LOW (ref >90)
Glucose, Bld: 216 mg/dL — ABNORMAL HIGH (ref 70–99)
Potassium: 4.4 mEq/L (ref 3.5–5.1)
Sodium: 134 mEq/L — ABNORMAL LOW (ref 135–145)
Total Bilirubin: 0.2 mg/dL — ABNORMAL LOW (ref 0.3–1.2)

## 2013-02-14 LAB — URINALYSIS, ROUTINE W REFLEX MICROSCOPIC
Bilirubin Urine: NEGATIVE
Ketones, ur: NEGATIVE mg/dL
Leukocytes, UA: NEGATIVE
Nitrite: NEGATIVE
Protein, ur: NEGATIVE mg/dL
Urobilinogen, UA: 0.2 mg/dL (ref 0.0–1.0)

## 2013-02-14 MED ORDER — ALBUTEROL SULFATE (5 MG/ML) 0.5% IN NEBU
5.0000 mg | INHALATION_SOLUTION | Freq: Once | RESPIRATORY_TRACT | Status: AC
  Administered 2013-02-14: 5 mg via RESPIRATORY_TRACT
  Filled 2013-02-14: qty 1

## 2013-02-14 MED ORDER — AZITHROMYCIN 250 MG PO TABS: ORAL_TABLET | ORAL | Status: DC

## 2013-02-14 MED ORDER — SODIUM CHLORIDE 0.9 % IV BOLUS (SEPSIS)
500.0000 mL | Freq: Once | INTRAVENOUS | Status: AC
  Administered 2013-02-14: 21:00:00 via INTRAVENOUS

## 2013-02-14 MED ORDER — ALBUTEROL SULFATE HFA 108 (90 BASE) MCG/ACT IN AERS
2.0000 | INHALATION_SPRAY | RESPIRATORY_TRACT | Status: DC | PRN
  Administered 2013-02-14: 2 via RESPIRATORY_TRACT
  Filled 2013-02-14: qty 6.7

## 2013-02-14 NOTE — ED Provider Notes (Signed)
CSN: [id number]     Arrival date & time 02/14/13  1946 History  This chart was scribed for George Lennert, MD by George Cook, ED Scribe. This patient was seen in room [facility]18[facility]18 and the patient's care was started at 8:00 PM.    Chief Complaint  Patient presents with  . Dizziness  . Cough  . Fatigue   Patient is a 79 y.o. male presenting with weakness. The history is provided by the patient and a relative. No language interpreter was used.  Weakness This is a new problem. The current episode started more than 2 days ago. The problem occurs constantly. The problem has been gradually worsening. Associated symptoms include shortness of breath. Pertinent negatives include no chest pain, no abdominal pain and no headaches. Associated symptoms comments: wheezing, chills . Nothing aggravates the symptoms. Nothing relieves the symptoms. He has tried nothing for the symptoms. The treatment provided no relief.    HPI Comments: George Cook is a 79 y.o. male who presents to the Emergency Department complaining of weakness which began 3 days ago. Pt has associated chills, SOB, dizziness, wheezing. Pt's son reports pt is unsteady when walking. Pt is currently a diabetic. Pt denies fever, emesis, nausea or any other symptoms.     Past Medical History  Diagnosis Date  . HTN (hypertension)   . DM (diabetes mellitus)   . Kidney stones   . Carotid artery occlusion   . Obesity   . Stroke 2004  . Hx-TIA (transient ischemic attack)   . Macular degeneration    Past Surgical History  Procedure Laterality Date  . Finger surgery Left     pinky, after saw cut it off  . Cataract extraction w/phaco Right 10/02/2012    Procedure: CATARACT EXTRACTION PHACO AND INTRAOCULAR LENS PLACEMENT (IOC);  Surgeon: George Payor, MD;  Location: [facility];  Service: Ophthalmology;  Laterality: Right;  CDE 12.78  . Eye surgery    . Cataract extraction w/phaco Left 10/30/2012    Procedure: CATARACT EXTRACTION PHACO AND  INTRAOCULAR LENS PLACEMENT (IOC);  Surgeon: George Payor, MD;  Location: [facility];  Service: Ophthalmology;  Laterality: Left;  CDE: 19.10   Family History  Problem Relation Age of Onset  . Diabetes Mother   . Stroke Mother   . Coronary artery disease Father   . Stroke Sister   . Diabetes Sister   . Coronary artery disease Brother    History  Substance Use Topics  . Smoking status: Former Smoker -- 1.50 packs/day for 40 years    Types: Cigarettes    Quit date: 05/25/1991  . Smokeless tobacco: Never Used  . Alcohol Use: No    Review of Systems  Constitutional: Positive for chills. Negative for fever, appetite change and fatigue.  HENT: Negative for congestion, sinus pressure and ear discharge.   Eyes: Negative for discharge.  Respiratory: Positive for shortness of breath and wheezing. Negative for cough.   Cardiovascular: Negative for chest pain.  Gastrointestinal: Negative for nausea, vomiting, abdominal pain and diarrhea.  Genitourinary: Negative for frequency and hematuria.  Musculoskeletal: Negative for back pain.  Skin: Negative for rash.  Neurological: Positive for dizziness and weakness. Negative for seizures and headaches.  Psychiatric/Behavioral: Negative for hallucinations.  All other systems reviewed and are negative.    Allergies  Aspirin  Home Medications   Current Outpatient Rx  Name  Route  Sig  Dispense  Refill  . acetaminophen (TYLENOL) 325 MG tablet   Oral   Take  650 mg by mouth every 6 (six) hours as needed for pain.          . Ascorbic Acid (VITAMIN C) 1000 MG tablet   Oral   Take 1,000 mg by mouth daily.           . bumetanide (BUMEX) 2 MG tablet   Oral   Take 2 mg by mouth daily as needed (Fluid).         . clopidogrel (PLAVIX) 75 MG tablet   Oral   Take 75 mg by mouth daily.           . fenofibrate micronized (LOFIBRA) 134 MG capsule   Oral   Take 134 mg by mouth daily before breakfast.         . glimepiride (AMARYL) 2 MG  tablet   Oral   Take 2 mg by mouth daily before breakfast.           . Multiple Vitamin (MULTIVITAMIN) capsule   Oral   Take 1 capsule by mouth daily.           . niacin (NIASPAN) 500 MG CR tablet   Oral   Take 500 mg by mouth at bedtime.           . Potassium 99 MG TABS   Oral   Take 1 tablet by mouth daily as needed (Only takes when he takes a fluid pill).          . solifenacin (VESICARE) 10 MG tablet   Oral   Take 10 mg by mouth daily.           Triage Vitals: BP 143/65  Pulse 74  Temp(Src) 97.4 F (36.3 C) (Oral)  Resp 16  Ht 5' 10.5" (1.791 m)  Wt 255 lb (115.667 kg)  BMI 36.06 kg/m2  SpO2 100%  Physical Exam  Nursing note and vitals reviewed. Constitutional: He is oriented to person, place, and time. He appears well-developed. No distress.  HENT:  Head: Normocephalic.  Eyes: Conjunctivae and EOM are normal. No scleral icterus.  Neck: Neck supple. No thyromegaly present.  Cardiovascular: Normal rate and regular rhythm.  Exam reveals no gallop and no friction rub.   No murmur heard. Pulmonary/Chest: No stridor. He has wheezes. He has no rales. He exhibits no tenderness.  Mild wheezing bilaterally    Abdominal: Soft. He exhibits no distension. There is no tenderness. There is no rebound.  Musculoskeletal: Normal range of motion. He exhibits no edema.  Moderate generalized weakness  Lymphadenopathy:    He has no cervical adenopathy.  Neurological: He is oriented to person, place, and time. He exhibits normal muscle tone. Coordination normal.  Skin: Skin is warm and dry. No rash noted. No erythema.  Psychiatric: He has a normal mood and affect. His behavior is normal.    ED Course  Procedures (including critical care time)  DIAGNOSTIC STUDIES: Oxygen Saturation is 100% on RA, normal by my interpretation.    COORDINATION OF CARE: 8:03 PM- Pt advised of plan for treatment, including a breathing treatment. Will also order diagnostic labs and  radiology. Pt agrees with plan for treatment..  Medications  albuterol (PROVENTIL) (5 MG/ML) 0.5% nebulizer solution 5 mg (5 mg Nebulization Given 02/14/13 2032)  sodium chloride 0.9 % bolus 500 mL ( Intravenous New Bag/Given 02/14/13 2119)    Labs Review Labs Reviewed  CBC WITH DIFFERENTIAL - Abnormal; Notable for the following:    Neutrophils Relative % 80 (*)    Neutro  Abs 8.3 (*)    All other components within normal limits  COMPREHENSIVE METABOLIC PANEL - Abnormal; Notable for the following:    Sodium 134 (*)    Glucose, Bld 216 (*)    BUN 28 (*)    Alkaline Phosphatase 36 (*)    Total Bilirubin 0.2 (*)    GFR calc non Af Amer 67 (*)    GFR calc Af Amer 77 (*)    All other components within normal limits  URINALYSIS, ROUTINE W REFLEX MICROSCOPIC - Abnormal; Notable for the following:    Specific Gravity, Urine >1.030 (*)    All other components within normal limits  TROPONIN I   Imaging Review Dg Chest 1 View  02/14/2013   CLINICAL DATA:  Dizziness  EXAM: CHEST - 1 VIEW  COMPARISON:  August 07, 2012  FINDINGS: Lungs are clear. Heart is upper normal in size with normal pulmonary vascularity. No adenopathy. There is atherosclerotic change in the aorta.  IMPRESSION: No edema or consolidation.   Electronically Signed   By: Bretta Bang   On: 02/14/2013 20:59   Ct Head Wo Contrast  02/14/2013   CLINICAL DATA:  Dizziness, cough  EXAM: CT HEAD WITHOUT CONTRAST  TECHNIQUE: Contiguous axial images were obtained from the base of the skull through the vertex without intravenous contrast.  COMPARISON:  02/12/2009  FINDINGS: No skull fracture is noted. Paranasal sinuses and mastoid air cells are unremarkable.  No intracranial hemorrhage, mass effect or midline shift. Small lacunar infarct right basal ganglia is stable. Stable old infarct in left frontal lobe. Cerebral atrophy is stable. Stable periventricular and patchy subcortical white matter decreased attenuation consistent with chronic  small vessel ischemic changes.  No acute cortical infarction. No mass lesion is noted on this unenhanced scan.  IMPRESSION: No acute intracranial abnormality. Stable atrophy and chronic white matter disease. Stable small lacunar infarct in right basal ganglia.   Electronically Signed   By: Natasha Mead   On: 02/14/2013 21:02   Date: 02/14/2013  Rate: 70  Rhythm: normal sinus rhythm  QRS Axis: normal  Intervals: normal  ST/T Wave abnormalities: normal  Conduction Disutrbances:right bundle branch block  Narrative Interpretation:   Old EKG Reviewed: unchanged     MDM  No diagnosis found.  The chart was scribed for me under my direct supervision.  I personally performed the history, physical, and medical decision making and all procedures in the evaluation of this patient.George Lennert, MD 02/14/13 2232

## 2013-02-14 NOTE — ED Notes (Signed)
Pt c/o dizziness x 5 days with hoarseness, cough and weakness.

## 2013-03-07 ENCOUNTER — Encounter (HOSPITAL_COMMUNITY): Payer: Self-pay | Admitting: Emergency Medicine

## 2013-03-07 ENCOUNTER — Inpatient Hospital Stay (HOSPITAL_COMMUNITY)
Admission: EM | Admit: 2013-03-07 | Discharge: 2013-03-11 | DRG: 175 | Disposition: A | Discharge status: Home / Self Care | Payer: Medicare Other | Attending: Cardiology | Admitting: Cardiology

## 2013-03-07 ENCOUNTER — Emergency Department (HOSPITAL_COMMUNITY): Payer: Medicare Other

## 2013-03-07 DIAGNOSIS — I6529 Occlusion and stenosis of unspecified carotid artery: Secondary | ICD-10-CM | POA: Diagnosis present

## 2013-03-07 DIAGNOSIS — I10 Essential (primary) hypertension: Secondary | ICD-10-CM | POA: Diagnosis present

## 2013-03-07 DIAGNOSIS — Z6836 Body mass index (BMI) 36.0-36.9, adult: Secondary | ICD-10-CM

## 2013-03-07 DIAGNOSIS — I2510 Atherosclerotic heart disease of native coronary artery without angina pectoris: Secondary | ICD-10-CM

## 2013-03-07 DIAGNOSIS — E669 Obesity, unspecified: Secondary | ICD-10-CM | POA: Diagnosis present

## 2013-03-07 DIAGNOSIS — I4510 Unspecified right bundle-branch block: Secondary | ICD-10-CM | POA: Diagnosis present

## 2013-03-07 DIAGNOSIS — R8290 Unspecified abnormal findings in urine: Secondary | ICD-10-CM

## 2013-03-07 DIAGNOSIS — I350 Nonrheumatic aortic (valve) stenosis: Secondary | ICD-10-CM

## 2013-03-07 DIAGNOSIS — I82401 Acute embolism and thrombosis of unspecified deep veins of right lower extremity: Secondary | ICD-10-CM

## 2013-03-07 DIAGNOSIS — E119 Type 2 diabetes mellitus without complications: Secondary | ICD-10-CM

## 2013-03-07 DIAGNOSIS — I200 Unstable angina: Secondary | ICD-10-CM

## 2013-03-07 DIAGNOSIS — I359 Nonrheumatic aortic valve disorder, unspecified: Secondary | ICD-10-CM | POA: Diagnosis present

## 2013-03-07 DIAGNOSIS — Z87891 Personal history of nicotine dependence: Secondary | ICD-10-CM

## 2013-03-07 DIAGNOSIS — G609 Hereditary and idiopathic neuropathy, unspecified: Secondary | ICD-10-CM | POA: Diagnosis present

## 2013-03-07 DIAGNOSIS — I249 Acute ischemic heart disease, unspecified: Secondary | ICD-10-CM

## 2013-03-07 DIAGNOSIS — I2699 Other pulmonary embolism without acute cor pulmonale: Secondary | ICD-10-CM | POA: Diagnosis present

## 2013-03-07 DIAGNOSIS — I440 Atrioventricular block, first degree: Secondary | ICD-10-CM | POA: Diagnosis present

## 2013-03-07 DIAGNOSIS — I824Y9 Acute embolism and thrombosis of unspecified deep veins of unspecified proximal lower extremity: Secondary | ICD-10-CM | POA: Diagnosis present

## 2013-03-07 DIAGNOSIS — I214 Non-ST elevation (NSTEMI) myocardial infarction: Secondary | ICD-10-CM | POA: Diagnosis present

## 2013-03-07 DIAGNOSIS — H3530 Unspecified macular degeneration: Secondary | ICD-10-CM | POA: Diagnosis present

## 2013-03-07 DIAGNOSIS — Z8673 Personal history of transient ischemic attack (TIA), and cerebral infarction without residual deficits: Secondary | ICD-10-CM

## 2013-03-07 HISTORY — DX: Diaphragmatic hernia without obstruction or gangrene: K44.9

## 2013-03-07 HISTORY — DX: Calculus of kidney: N20.0

## 2013-03-07 LAB — CBC
MCH: 29.1 pg (ref 26.0–34.0)
MCHC: 33.8 g/dL (ref 30.0–36.0)
MCV: 86.1 fL (ref 78.0–100.0)
Platelets: 139 10*3/uL — ABNORMAL LOW (ref 150–400)
RBC: 5.02 MIL/uL (ref 4.22–5.81)
RDW: 13.4 % (ref 11.5–15.5)

## 2013-03-07 LAB — BASIC METABOLIC PANEL
CO2: 27 mEq/L (ref 19–32)
Calcium: 9.6 mg/dL (ref 8.4–10.5)
Creatinine, Ser: 0.87 mg/dL (ref 0.50–1.35)
GFR calc non Af Amer: 78 mL/min — ABNORMAL LOW (ref >90)

## 2013-03-07 LAB — PRO B NATRIURETIC PEPTIDE: Pro B Natriuretic peptide (BNP): 169.7 pg/mL (ref 0–450)

## 2013-03-07 LAB — GLUCOSE, CAPILLARY: Glucose-Capillary: 224 mg/dL — ABNORMAL HIGH (ref 70–99)

## 2013-03-07 LAB — MRSA PCR SCREENING: MRSA by PCR: NEGATIVE

## 2013-03-07 MED ORDER — CLOPIDOGREL BISULFATE 75 MG PO TABS
75.0000 mg | ORAL_TABLET | Freq: Every day | ORAL | Status: DC
  Administered 2013-03-08: 75 mg via ORAL
  Filled 2013-03-07 (×2): qty 1

## 2013-03-07 MED ORDER — NITROGLYCERIN 0.4 MG SL SUBL: 0.4000 mg | SUBLINGUAL_TABLET | SUBLINGUAL | Status: DC | PRN

## 2013-03-07 MED ORDER — GLIMEPIRIDE 2 MG PO TABS
2.0000 mg | ORAL_TABLET | Freq: Every day | ORAL | Status: DC
  Administered 2013-03-09 – 2013-03-11 (×3): 2 mg via ORAL
  Filled 2013-03-07 (×5): qty 1

## 2013-03-07 MED ORDER — INSULIN ASPART 100 UNIT/ML ~~LOC~~ SOLN
0.0000 [IU] | Freq: Three times a day (TID) | SUBCUTANEOUS | Status: DC
  Administered 2013-03-08: 2 [IU] via SUBCUTANEOUS
  Administered 2013-03-08 – 2013-03-10 (×3): 3 [IU] via SUBCUTANEOUS
  Administered 2013-03-10: 2 [IU] via SUBCUTANEOUS
  Administered 2013-03-10: 3 [IU] via SUBCUTANEOUS
  Administered 2013-03-11: 5 [IU] via SUBCUTANEOUS
  Administered 2013-03-11: 2 [IU] via SUBCUTANEOUS

## 2013-03-07 MED ORDER — ONDANSETRON HCL 4 MG/2ML IJ SOLN: 4.0000 mg | Freq: Four times a day (QID) | INTRAMUSCULAR | Status: DC | PRN

## 2013-03-07 MED ORDER — ACETAMINOPHEN 325 MG PO TABS: 650.0000 mg | ORAL_TABLET | ORAL | Status: DC | PRN

## 2013-03-07 MED ORDER — ZOLPIDEM TARTRATE 5 MG PO TABS
5.0000 mg | ORAL_TABLET | Freq: Every evening | ORAL | Status: DC | PRN
  Administered 2013-03-07 – 2013-03-09 (×3): 5 mg via ORAL
  Filled 2013-03-07 (×3): qty 1

## 2013-03-07 MED ORDER — DONEPEZIL HCL 10 MG PO TABS
10.0000 mg | ORAL_TABLET | Freq: Every day | ORAL | Status: DC
  Administered 2013-03-07 – 2013-03-10 (×4): 10 mg via ORAL
  Filled 2013-03-07 (×5): qty 1

## 2013-03-07 MED ORDER — NITROGLYCERIN 2 % TD OINT
0.5000 [in_us] | TOPICAL_OINTMENT | Freq: Four times a day (QID) | TRANSDERMAL | Status: DC
  Administered 2013-03-07 – 2013-03-11 (×13): 0.5 [in_us] via TOPICAL
  Filled 2013-03-07: qty 30

## 2013-03-07 MED ORDER — HEPARIN (PORCINE) IN NACL 100-0.45 UNIT/ML-% IJ SOLN
1400.0000 [IU]/h | INTRAMUSCULAR | Status: DC
  Administered 2013-03-07 – 2013-03-08 (×2): 1400 [IU]/h via INTRAVENOUS
  Filled 2013-03-07 (×4): qty 250

## 2013-03-07 MED ORDER — CALCIUM CARBONATE-VITAMIN D 500-200 MG-UNIT PO TABS
1.0000 | ORAL_TABLET | Freq: Every day | ORAL | Status: DC
  Administered 2013-03-08 – 2013-03-11 (×4): 1 via ORAL
  Filled 2013-03-07 (×4): qty 1

## 2013-03-07 MED ORDER — METOPROLOL TARTRATE 25 MG PO TABS
25.0000 mg | ORAL_TABLET | Freq: Two times a day (BID) | ORAL | Status: DC
  Administered 2013-03-07 – 2013-03-08 (×3): 25 mg via ORAL
  Filled 2013-03-07 (×6): qty 1

## 2013-03-07 MED ORDER — LISINOPRIL 2.5 MG PO TABS
2.5000 mg | ORAL_TABLET | Freq: Every day | ORAL | Status: DC
  Administered 2013-03-08: 2.5 mg via ORAL
  Filled 2013-03-07 (×2): qty 1

## 2013-03-07 MED ORDER — ATORVASTATIN CALCIUM 80 MG PO TABS
80.0000 mg | ORAL_TABLET | Freq: Every day | ORAL | Status: DC
  Administered 2013-03-07 – 2013-03-10 (×4): 80 mg via ORAL
  Filled 2013-03-07 (×5): qty 1

## 2013-03-07 MED ORDER — MIRABEGRON ER 50 MG PO TB24
50.0000 mg | ORAL_TABLET | Freq: Every day | ORAL | Status: DC
  Administered 2013-03-07 – 2013-03-11 (×5): 50 mg via ORAL
  Filled 2013-03-07 (×5): qty 1

## 2013-03-07 MED ORDER — METOPROLOL TARTRATE 1 MG/ML IV SOLN
5.0000 mg | Freq: Once | INTRAVENOUS | Status: AC
  Administered 2013-03-07: 5 mg via INTRAVENOUS
  Filled 2013-03-07: qty 5

## 2013-03-07 MED ORDER — CLOPIDOGREL BISULFATE 75 MG PO TABS: 150.0000 mg | ORAL_TABLET | Freq: Once | ORAL | Status: DC

## 2013-03-07 MED ORDER — CLOPIDOGREL BISULFATE 75 MG PO TABS
75.0000 mg | ORAL_TABLET | Freq: Once | ORAL | Status: AC
  Administered 2013-03-07: 75 mg via ORAL
  Filled 2013-03-07: qty 1

## 2013-03-07 MED ORDER — SODIUM CHLORIDE 0.9 % IJ SOLN
3.0000 mL | Freq: Two times a day (BID) | INTRAMUSCULAR | Status: DC
  Administered 2013-03-08 – 2013-03-09 (×3): 3 mL via INTRAVENOUS

## 2013-03-07 MED ORDER — SODIUM CHLORIDE 0.9 % IV SOLN: 250.0000 mL | INTRAVENOUS | Status: DC | PRN

## 2013-03-07 MED ORDER — HEPARIN BOLUS VIA INFUSION
4000.0000 [IU] | Freq: Once | INTRAVENOUS | Status: AC
  Administered 2013-03-07: 4000 [IU] via INTRAVENOUS
  Filled 2013-03-07: qty 4000

## 2013-03-07 MED ORDER — SODIUM CHLORIDE 0.9 % IV SOLN
1.0000 mL/kg/h | INTRAVENOUS | Status: DC
  Administered 2013-03-08: 1 mL/kg/h via INTRAVENOUS

## 2013-03-07 MED ORDER — SODIUM CHLORIDE 0.9 % IJ SOLN: 3.0000 mL | INTRAMUSCULAR | Status: DC | PRN

## 2013-03-07 MED ORDER — ADULT MULTIVITAMIN W/MINERALS CH
1.0000 | ORAL_TABLET | Freq: Every day | ORAL | Status: DC
  Administered 2013-03-08 – 2013-03-11 (×4): 1 via ORAL
  Filled 2013-03-07 (×4): qty 1

## 2013-03-07 NOTE — Plan of Care (Signed)
Problem: Phase I Progression Outcomes Goal: Aspirin unless contraindicated Outcome: Not Met (add Reason) Pt allergic

## 2013-03-07 NOTE — ED Provider Notes (Addendum)
CSN: [id number]     Arrival date & time 03/07/13  1051 History   First MD Initiated Contact with Patient 03/07/13 1112     Chief Complaint  Patient presents with  . Chest Pain   ) The history is provided by the patient.   The patient came the emergency department today after awakening with severe substernal chest pain with radiation down his bilateral arms.  No radiation up to his neck or through to his back.  He states it made her nauseated and short of breath.  He's never had symptoms like this before.  He does have a hiatal hernia but states this feels different.  He's never had a heart catheterization.  He does not note a history of coronary artery disease.  He does have a history of arterial disease including prior stroke.  His had no recent trauma.  He states he is allergic to aspirin.  He is on 75 mg of daily Plavix given his prior stroke.  At this time he states he is without any discomfort or pain.  He received no medications by EMS including no nitroglycerin.  He has a history of diabetes hypertension and prior stroke.   Past Medical History  Diagnosis Date  . HTN (hypertension)   . DM (diabetes mellitus)   . Kidney stones   . Carotid artery occlusion   . Obesity   . Stroke 2004  . Hx-TIA (transient ischemic attack)   . Macular degeneration    Past Surgical History  Procedure Laterality Date  . Finger surgery Left     pinky, after saw cut it off  . Cataract extraction w/phaco Right 10/02/2012    Procedure: CATARACT EXTRACTION PHACO AND INTRAOCULAR LENS PLACEMENT (IOC);  Surgeon: Gemma Payor, MD;  Location: [facility];  Service: Ophthalmology;  Laterality: Right;  CDE 12.78  . Eye surgery    . Cataract extraction w/phaco Left 10/30/2012    Procedure: CATARACT EXTRACTION PHACO AND INTRAOCULAR LENS PLACEMENT (IOC);  Surgeon: Gemma Payor, MD;  Location: [facility];  Service: Ophthalmology;  Laterality: Left;  CDE: 19.10   Family History  Problem Relation Age of Onset  . Diabetes Mother    . Stroke Mother   . Coronary artery disease Father   . Stroke Sister   . Diabetes Sister   . Coronary artery disease Brother    History  Substance Use Topics  . Smoking status: Former Smoker -- 1.50 packs/day for 40 years    Types: Cigarettes    Quit date: 05/25/1991  . Smokeless tobacco: Never Used  . Alcohol Use: No    Review of Systems  All other systems reviewed and are negative.    Allergies  Aspirin  Home Medications   Current Outpatient Rx  Name  Route  Sig  Dispense  Refill  . acetaminophen (TYLENOL) 325 MG tablet   Oral   Take 650 mg by mouth every 6 (six) hours as needed for pain.          . Ascorbic Acid (VITAMIN C) 1000 MG tablet   Oral   Take 1,000 mg by mouth daily.           . bumetanide (BUMEX) 2 MG tablet   Oral   Take 2 mg by mouth daily as needed (Fluid).         . calcium-vitamin D (OSCAL WITH D) 500-200 MG-UNIT per tablet   Oral   Take 1 tablet by mouth daily.         Marland Kitchen  clopidogrel (PLAVIX) 75 MG tablet   Oral   Take 75 mg by mouth daily.           Marland Kitchen donepezil (ARICEPT) 10 MG tablet   Oral   Take 10 mg by mouth at bedtime.         . fenofibrate (TRICOR) 145 MG tablet   Oral   Take 145 mg by mouth daily.         Marland Kitchen glimepiride (AMARYL) 2 MG tablet   Oral   Take 2 mg by mouth daily before breakfast.           . lisinopril (PRINIVIL,ZESTRIL) 2.5 MG tablet   Oral   Take 2.5 mg by mouth daily.         . mirabegron ER (MYRBETRIQ) 50 MG TB24 tablet   Oral   Take 50 mg by mouth daily.         . Multiple Vitamin (MULTIVITAMIN WITH MINERALS) TABS tablet   Oral   Take 1 tablet by mouth daily.         Marland Kitchen NIACIN PO   Oral   Take 1 tablet by mouth daily.          BP 109/80  Pulse 120  Temp(Src) 98.5 F (36.9 C) (Oral)  Resp 34  Ht 5' 10.47" (1.79 m)  Wt 255 lb 1.2 oz (115.7 kg)  BMI 36.11 kg/m2  SpO2 96% Physical Exam  Nursing note and vitals reviewed. Constitutional: He is oriented to person, place,  and time. He appears well-developed and well-nourished.  HENT:  Head: Normocephalic and atraumatic.  Eyes: EOM are normal.  Neck: Normal range of motion.  Cardiovascular: Normal rate, regular rhythm, normal heart sounds and intact distal pulses.   Pulmonary/Chest: Effort normal and breath sounds normal. No respiratory distress.  Abdominal: Soft. He exhibits no distension. There is no tenderness.  Musculoskeletal: Normal range of motion.  Neurological: He is alert and oriented to person, place, and time.  Skin: Skin is warm and dry.  Psychiatric: He has a normal mood and affect. Judgment normal.    ED Course  Procedures (including critical care time)  CRITICAL CARE Performed by: Lyanne Co Total critical care time: 35 Critical care time was exclusive of separately billable procedures and treating other patients. Critical care was necessary to treat or prevent imminent or life-threatening deterioration. Critical care was time spent personally by me on the following activities: development of treatment plan with patient and/or surrogate as well as nursing, discussions with consultants, evaluation of patient's response to treatment, examination of patient, obtaining history from patient or surrogate, ordering and performing treatments and interventions, ordering and review of laboratory studies, ordering and review of radiographic studies, pulse oximetry and re-evaluation of patient's condition.   Labs Review Labs Reviewed  CBC - Abnormal; Notable for the following:    Platelets 139 (*)    All other components within normal limits  BASIC METABOLIC PANEL - Abnormal; Notable for the following:    Glucose, Bld 306 (*)    GFR calc non Af Amer 78 (*)    GFR calc Af Amer 90 (*)    All other components within normal limits  TROPONIN I - Abnormal; Notable for the following:    Troponin I 0.52 (*)    All other components within normal limits  POCT I-STAT TROPONIN I - Abnormal; Notable  for the following:    Troponin i, poc 0.29 (*)    All other components within normal limits  PRO B NATRIURETIC PEPTIDE  HEPARIN LEVEL (UNFRACTIONATED)   Imaging Review Dg Chest Port 1 View  03/07/2013   CLINICAL DATA:  Chest pain  EXAM: PORTABLE CHEST - 1 VIEW  COMPARISON:  02/14/2013  FINDINGS: Mild cardiac enlargement. Negative for heart failure. Lungs are clear without infiltrate effusion or mass.  IMPRESSION: No active disease.   Electronically Signed   By: Marlan Palau M.D.   On: 03/07/2013 11:33   ECG interpretation  Date: 03/07/2013  Rate: 110  Rhythm: sinus tachycardia  QRS Axis: normal  Intervals: normal  ST/T Wave abnormalities: Diffuse ST depression T wave changes  Conduction Disutrbances: Incomplete right bundle branch block  Narrative Interpretation:   Old EKG Reviewed: change from prior EKG  ECG interpretation 1419pm  Date: 03/07/2013  Rate: 92  Rhythm: normal sinus rhythm  QRS Axis: normal  Intervals: normal  ST/T Wave abnormalities: Nonspecific ST changes  Conduction Disutrbances: Right bundle branch block   Narrative Interpretation:   Old EKG Reviewed: Improvement in inferior lateral ST changes with rate control     MDM   1. NSTEMI (non-ST elevated myocardial infarction)   2. ACS (acute coronary syndrome)    Patient's story is concerning for unstable angina.  He has what appears to be new inferolateral ST changes concerning for ischemia.  His troponin is elevated.  He is now chest pain-free.  The patient restarted on a heparin drip.  He has an aspirin allergy and therefore he was given 150 mg of Plavix.  She has no active discomfort or pain at this time.  Doubt dissection.  Doubt pulmonary embolism.  The patient has a known history of arterial disease and therefore this is likely coronary artery disease as the cause of his symptoms.  Cardiology consultation pending.  Patient's heart rate continues around 105-110.  He was given 5 mg IV Lopressor    Lyanne Co, MD 03/07/13 1310  Lyanne Co, MD 03/07/13 1435

## 2013-03-07 NOTE — Progress Notes (Signed)
Unit CM UR Completed by [facility] ED CM  W. Avannah Decker RN  

## 2013-03-07 NOTE — Progress Notes (Signed)
ANTICOAGULATION CONSULT NOTE - Initial Consult  Pharmacy Consult for Heparin Indication: chest pain/ACS  Allergies  Allergen Reactions  . Aspirin Hives and Rash    Patient Measurements:   Heparin Dosing Weight: 101 kg   Vital Signs: Temp: 98.5 F (36.9 C) (10/15 1108) Temp src: Oral (10/15 1108) BP: 109/80 mmHg (10/15 1200) Pulse Rate: 120 (10/15 1200)  Labs:  Recent Labs  03/07/13 1120  HGB 14.6  HCT 43.2  PLT 139*  CREATININE 0.87    The CrCl is unknown because both a height and weight (above a minimum accepted value) are required for this calculation.   Medical History: Past Medical History  Diagnosis Date  . HTN (hypertension)   . DM (diabetes mellitus)   . Kidney stones   . Carotid artery occlusion   . Obesity   . Stroke 2004  . Hx-TIA (transient ischemic attack)   . Macular degeneration     Medications: f/u med rec  Assessment: CP 79 y/o M awoke this am with 10/10 CP. Patient has elevated troponin 0.29. Note ASA allergy.   PMH: HTN, DM, carotid artery occlusion, obesity, CVA and TIA, macular degeneration.  Anticoagulation: Start IV heparin for r/o MI. Baseline Hgb 14.6 and plts 139 (watch)  Goal of Therapy:  Heparin level 0.3-0.7 units/ml Monitor platelets by anticoagulation protocol: Yes   Plan:  Heparin 4000 unit IV bolus and infusion at 1400 units/hr Check heparin level in 6-8 hrs and daily.   Georganna Maxson S. Merilynn Finland, PharmD, BCPS Clinical Staff Pharmacist Pager [phone]  Cornel Werber S. Merilynn Finland, PharmD, BCPS Clinical Staff Pharmacist Pager [phone]  Misty Stanley Stillinger 03/07/2013,1:02 PM

## 2013-03-07 NOTE — ED Notes (Signed)
Troponin results shown to Dr. Patria Mane

## 2013-03-07 NOTE — ED Notes (Signed)
Cardiology at bedside, stated ok to give patient lunch

## 2013-03-07 NOTE — Progress Notes (Signed)
ANTICOAGULATION CONSULT NOTE - Follow Up Consult  Pharmacy Consult for heparin Indication: NSTEMI  Labs:  Recent Labs  03/07/13 1120 03/07/13 1306 03/07/13 1833 03/07/13 2246  HGB 14.6  --   --   --   HCT 43.2  --   --   --   PLT 139*  --   --   --   HEPARINUNFRC  --   --   --  0.55  CREATININE 0.87  --   --   --   TROPONINI  --  0.52* 0.48*  --     Assessment/Plan: 79yo male therapeutic on heparin with initial dosing for CP.  Will continue gtt at current rate and confirm stable with am labs.  Vernard Gambles, PharmD, BCPS  03/07/2013,11:36 PM

## 2013-03-07 NOTE — ED Notes (Signed)
Pt in from home c/o chest pain that started this am when he woke up, pt was diaphoretic upon EMS arrival, rated pain 10/10 initially, no ASA given due to allergy, unable to establish IV en route, pt pain improved to 0 upon arrival to ED but patient c/o shortness of breath, pt able to speak in full sentences, states he normally has some shortness of breath with exertion, pt diaphoretic upon arrival but this has improved some, alert, no distress noted

## 2013-03-07 NOTE — H&P (Signed)
Patient ID: JOAH PATLAN MRN: [id number], DOB/AGE: [birth year]   Admit date: 03/07/2013  Primary Physician: Fredirick Maudlin, MD Primary Cardiologist: new - will follow up in [place]  Pt. Profile:  79 year old male without prior history of CAD who presents with chest pain and elevated troponin.  Problem List  Past Medical History  Diagnosis Date  . HTN (hypertension)   . DM (diabetes mellitus)   . Nephrolithiasis   . Carotid artery occlusion     a. 05/2012 U/S Bilat ICA 20-39%.  . Obesity   . Stroke     a. 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 01/2009 TEE: EF 60-65%, mild MR, no LA/LAA thrombus.  . Macular degeneration   . Hiatal hernia     Past Surgical History  Procedure Laterality Date  . Finger surgery Left     pinky, after saw cut it off  . Cataract extraction w/phaco Right 10/02/2012    Procedure: CATARACT EXTRACTION PHACO AND INTRAOCULAR LENS PLACEMENT (IOC);  Surgeon: Gemma Payor, MD;  Location: [facility];  Service: Ophthalmology;  Laterality: Right;  CDE 12.78  . Eye surgery    . Cataract extraction w/phaco Left 10/30/2012    Procedure: CATARACT EXTRACTION PHACO AND INTRAOCULAR LENS PLACEMENT (IOC);  Surgeon: Gemma Payor, MD;  Location: [facility];  Service: Ophthalmology;  Laterality: Left;  CDE: 19.10     Allergies  Allergies  Allergen Reactions  . Aspirin Hives and Rash    HPI  79 year old male without prior cardiac history. He does have prior history of strokes, hypertension, diabetes, as well as nonobstructive carotid arterial disease. Patient lives at home with his wife but is relatively sedentary and spends much of his day in a recliner with his feet up. He ambulates with a cane and his ambulation is limited by leg weakness. His wife and son say that he shuffles his gait some, which they presumed secondary to his diabetes and probable peripheral neuropathy.  He was in his usual state of health until this morning at approximately 7:30 AM. While sitting up,  he developed substernal chest heaviness and pressure associated with mild dyspnea and diaphoresis. He took his usual morning pills and ate breakfast and felt somewhat better after about an hour but symptoms continued at a lower level prompting him to present to the ED. Here, symptoms resolved completely without intervention. Total duration of symptoms was approximately 4 hours. Initial ECG here shows sinus rhythm with rates in the 90s and a chronic right bundle branch block. He has a first degree AV block which is slightly more pronounced than previously noted. He is no acute ST or T changes. Initial point-of-care troponin was elevated 0.29 and this has risen to 0.52 by troponin sent to the lab. He is currently pain-free and is on a heparin infusion. He has been treated with Plavix 150 mg. He has an aspirin allergy.  Home Medications  Prior to Admission medications   Medication Sig Start Date End Date Taking? Authorizing Provider  acetaminophen (TYLENOL) 325 MG tablet Take 650 mg by mouth every 6 (six) hours as needed for pain.    Yes Historical Provider, MD  Ascorbic Acid (VITAMIN C) 1000 MG tablet Take 1,000 mg by mouth daily.     Yes Historical Provider, MD  bumetanide (BUMEX) 2 MG tablet Take 2 mg by mouth daily as needed (Fluid).   Yes Historical Provider, MD  calcium-vitamin D (OSCAL WITH D) 500-200 MG-UNIT per tablet Take 1 tablet by mouth daily.  Yes Historical Provider, MD  clopidogrel (PLAVIX) 75 MG tablet Take 75 mg by mouth daily.     Yes Historical Provider, MD  donepezil (ARICEPT) 10 MG tablet Take 10 mg by mouth at bedtime.   Yes Historical Provider, MD  fenofibrate (TRICOR) 145 MG tablet Take 145 mg by mouth daily.   Yes Historical Provider, MD  glimepiride (AMARYL) 2 MG tablet Take 2 mg by mouth daily before breakfast.     Yes Historical Provider, MD  lisinopril (PRINIVIL,ZESTRIL) 2.5 MG tablet Take 2.5 mg by mouth daily.   Yes Historical Provider, MD  mirabegron ER (MYRBETRIQ) 50 MG  TB24 tablet Take 50 mg by mouth daily.   Yes Historical Provider, MD  Multiple Vitamin (MULTIVITAMIN WITH MINERALS) TABS tablet Take 1 tablet by mouth daily.   Yes Historical Provider, MD  NIACIN PO Take 1 tablet by mouth daily.   Yes Historical Provider, MD   Family History  Family History  Problem Relation Age of Onset  . Diabetes Mother   . Stroke Mother   . Coronary artery disease Father   . Stroke Sister   . Diabetes Sister   . Coronary artery disease Brother    Social History  History   Social History  . Marital Status: Married    Spouse Name: N/A    Number of Children: N/A  . Years of Education: N/A   Occupational History  . Not on file.   Social History Main Topics  . Smoking status: Former Smoker -- 3.00 packs/day for 40 years    Types: Cigarettes    Quit date: 05/25/1991  . Smokeless tobacco: Never Used  . Alcohol Use: No  . Drug Use: No  . Sexual Activity: Not on file   Other Topics Concern  . Not on file   Social History Narrative   Lives in [place] with wife.  Sedentary.  Does not exercise.  Spends most of the day in his recliner.    Review of Systems General:  No chills, fever, night sweats or weight changes.  Cardiovascular:  Positive chest pain associated with dyspnea and diaphoresis as outlined above. He has some degree of chronic lower extremity edema. He denies orthopnea, palpitations, paroxysmal nocturnal dyspnea, or syncope. Dermatological: No rash, lesions/masses Respiratory: No cough, positive dyspnea in setting of chest pain this morning. Urologic: No hematuria, dysuria Abdominal:   No nausea, vomiting, diarrhea, bright red blood per rectum, melena, or hematemesis Neurologic:  No visual changes, wkns, changes in mental status. All other systems reviewed and are otherwise negative except as noted above.  Physical Exam  Blood pressure 125/86, pulse 81, temperature 98.5 F (36.9 C), temperature source Oral, resp. rate 23, height 5'  10.47" (1.79 m), weight 255 lb 1.2 oz (115.7 kg), SpO2 99.00%.  General: Pleasant, NAD Psych: Normal affect. Neuro: Alert and oriented X 3. Moves all extremities spontaneously. HEENT: Normal  Neck: Supple without JVD. Bilateral bruits. Lungs:  Resp regular and unlabored, CTA. Heart: RRR no s3, s4, 2/6 systolic murmur heard throughout. Abdomen: Soft, non-tender, non-distended, BS + x 4.  Extremities: No clubbing, cyanosis. Trace bilateral lower extremity edema. DP/PT/Radials 2+ and equal bilaterally.  Labs   Recent Labs  03/07/13 1306  TROPONINI 0.52*   Lab Results  Component Value Date   WBC 8.8 03/07/2013   HGB 14.6 03/07/2013   HCT 43.2 03/07/2013   MCV 86.1 03/07/2013   PLT 139* 03/07/2013     Recent Labs Lab 03/07/13 1120  NA 137  K 4.6  CL 100  CO2 27  BUN 23  CREATININE 0.87  CALCIUM 9.6  GLUCOSE 306*   Radiology/Studies  Dg Chest Port 1 View  03/07/2013   CLINICAL DATA:  Chest pain  EXAM: PORTABLE CHEST - 1 VIEW    IMPRESSION: No active disease.   Electronically Signed   By: Marlan Palau M.D.   On: 03/07/2013 11:33   ECG  Rsr, 92, 1st deg avb, rbbb.   ASSESSMENT AND PLAN  1. Non-ST segment elevation myocardial infarction: Patient presented with a 4 hour episode of chest discomfort this morning which resolve spontaneously. Troponin is elevated at 0.52. ECG is nonacute. He is currently pain-free. Plan to admit and continue to cycle cardiac markers. Continue heparin and Plavix(aspirin allergy).  Will add beta blocker, nitro paste, and high potency statin therapy. We'll plan on diagnostic catheterization tomorrow morning or sooner if he has recurrent/recalcitrant chest pain.  2. Hypertension: Follow with addition of beta blocker. Continue home dose of ACE inhibitor therapy.  3. Lipids: At high potency statin in setting of #1. Followup lipids and LFTs.  4. Diabetes mellitus: Continue Amaryl. Add sliding scale insulin.  5. Systolic murmur: History of  mild mitral regurgitation by transesophageal echocardiogram in 2010. Repeat echocardiogram.  6. Carotid arterial disease with bilateral bruits: This is followed by vascular surgery and had an ultrasound in January showing bilateral 20-39% stenosis.  Signed, Nicolasa Ducking, NP 03/07/2013, 3:28 PM   The patient was seen, examined and discussed with Ward Givens, PA-C and I agree with the above.   In summary, a 79 year old male with no prior h/o CAD but multiple risk factors (including HTN, HLP, NIDDM, h/o strokes, PAD) who presented with retrosternal chest pain and was found to have elevated troponin. His ECG shows chronic RBBB and 1.AVB. The plan is to cath him for NSTEMI tomorrow unless there is recurrence of angina or dynamic ECG changes. Continue Plavix (ASA allergy ) and iv Heparin.  Tobias Alexander, H 03/07/2013

## 2013-03-08 ENCOUNTER — Encounter (HOSPITAL_COMMUNITY)
Admission: EM | Disposition: A | Discharge status: Home / Self Care | Payer: Self-pay | Source: Home / Self Care | Attending: Cardiology

## 2013-03-08 ENCOUNTER — Inpatient Hospital Stay (HOSPITAL_COMMUNITY): Payer: Medicare Other

## 2013-03-08 DIAGNOSIS — I369 Nonrheumatic tricuspid valve disorder, unspecified: Secondary | ICD-10-CM

## 2013-03-08 DIAGNOSIS — I2510 Atherosclerotic heart disease of native coronary artery without angina pectoris: Secondary | ICD-10-CM

## 2013-03-08 HISTORY — PX: LEFT HEART CATHETERIZATION WITH CORONARY ANGIOGRAM: SHX5451

## 2013-03-08 LAB — CBC
HCT: 39.5 % (ref 39.0–52.0)
Hemoglobin: 13 g/dL (ref 13.0–17.0)
MCHC: 32.9 g/dL (ref 30.0–36.0)
Platelets: 134 10*3/uL — ABNORMAL LOW (ref 150–400)
RBC: 4.53 MIL/uL (ref 4.22–5.81)
WBC: 8.1 10*3/uL (ref 4.0–10.5)

## 2013-03-08 LAB — GLUCOSE, CAPILLARY
Glucose-Capillary: 191 mg/dL — ABNORMAL HIGH (ref 70–99)
Glucose-Capillary: 127 mg/dL — ABNORMAL HIGH (ref 70–99)
Glucose-Capillary: 72 mg/dL (ref 70–99)
Glucose-Capillary: 154 mg/dL — ABNORMAL HIGH (ref 70–99)

## 2013-03-08 LAB — COMPREHENSIVE METABOLIC PANEL
ALT: 21 U/L (ref 0–53)
Albumin: 2.9 g/dL — ABNORMAL LOW (ref 3.5–5.2)
Alkaline Phosphatase: 28 U/L — ABNORMAL LOW (ref 39–117)
BUN: 20 mg/dL (ref 6–23)
Chloride: 101 mEq/L (ref 96–112)
Creatinine, Ser: 1 mg/dL (ref 0.50–1.35)
GFR calc Af Amer: 78 mL/min — ABNORMAL LOW (ref >90)
Glucose, Bld: 152 mg/dL — ABNORMAL HIGH (ref 70–99)
Potassium: 4.6 mEq/L (ref 3.5–5.1)
Sodium: 138 mEq/L (ref 135–145)
Total Bilirubin: 0.3 mg/dL (ref 0.3–1.2)
Total Protein: 5.9 g/dL — ABNORMAL LOW (ref 6.0–8.3)

## 2013-03-08 LAB — TROPONIN I: Troponin I: 0.36 ng/mL (ref <0.30)

## 2013-03-08 LAB — LIPID PANEL: Total CHOL/HDL Ratio: 3.4 RATIO

## 2013-03-08 LAB — PROTIME-INR: Prothrombin Time: 13.7 seconds (ref 11.6–15.2)

## 2013-03-08 SURGERY — LEFT HEART CATHETERIZATION WITH CORONARY ANGIOGRAM
Anesthesia: Moderate Sedation | Laterality: Bilateral

## 2013-03-08 MED ORDER — LOPERAMIDE HCL 2 MG PO CAPS: 2.0000 mg | ORAL_CAPSULE | ORAL | Status: DC | PRN

## 2013-03-08 MED ORDER — VERAPAMIL HCL 2.5 MG/ML IV SOLN
INTRAVENOUS | Status: AC
  Filled 2013-03-08: qty 2

## 2013-03-08 MED ORDER — ACETAMINOPHEN 325 MG PO TABS
650.0000 mg | ORAL_TABLET | ORAL | Status: DC | PRN
  Administered 2013-03-08 – 2013-03-10 (×4): 650 mg via ORAL
  Filled 2013-03-08 (×4): qty 2

## 2013-03-08 MED ORDER — FENTANYL CITRATE 0.05 MG/ML IJ SOLN
INTRAMUSCULAR | Status: AC
  Filled 2013-03-08: qty 2

## 2013-03-08 MED ORDER — MIDAZOLAM HCL 2 MG/2ML IJ SOLN
INTRAMUSCULAR | Status: AC
  Filled 2013-03-08: qty 2

## 2013-03-08 MED ORDER — RIVAROXABAN 15 MG PO TABS
15.0000 mg | ORAL_TABLET | Freq: Two times a day (BID) | ORAL | Status: DC
  Administered 2013-03-08 – 2013-03-11 (×6): 15 mg via ORAL
  Filled 2013-03-08 (×8): qty 1

## 2013-03-08 MED ORDER — LIDOCAINE HCL (PF) 1 % IJ SOLN
INTRAMUSCULAR | Status: AC
  Filled 2013-03-08: qty 30

## 2013-03-08 MED ORDER — TECHNETIUM TO 99M ALBUMIN AGGREGATED
6.0000 | Freq: Once | INTRAVENOUS | Status: AC | PRN
  Administered 2013-03-08: 6 via INTRAVENOUS

## 2013-03-08 MED ORDER — NITROGLYCERIN 0.2 MG/ML ON CALL CATH LAB
INTRAVENOUS | Status: AC
  Filled 2013-03-08: qty 1

## 2013-03-08 MED ORDER — HEPARIN SODIUM (PORCINE) 1000 UNIT/ML IJ SOLN
INTRAMUSCULAR | Status: AC
  Filled 2013-03-08: qty 1

## 2013-03-08 MED ORDER — HEPARIN SODIUM (PORCINE) 5000 UNIT/ML IJ SOLN
5000.0000 [IU] | Freq: Three times a day (TID) | INTRAMUSCULAR | Status: DC
  Filled 2013-03-08 (×2): qty 1

## 2013-03-08 MED ORDER — SODIUM CHLORIDE 0.9 % IV SOLN
INTRAVENOUS | Status: AC
Start: 2013-03-08 — End: 2013-03-08

## 2013-03-08 MED ORDER — ONDANSETRON HCL 4 MG/2ML IJ SOLN: 4.0000 mg | Freq: Four times a day (QID) | INTRAMUSCULAR | Status: DC | PRN

## 2013-03-08 MED ORDER — TECHNETIUM TC 99M DIETHYLENETRIAME-PENTAACETIC ACID: 40.0000 | Freq: Once | INTRAVENOUS | Status: AC | PRN

## 2013-03-08 MED ORDER — HEPARIN (PORCINE) IN NACL 2-0.9 UNIT/ML-% IJ SOLN
INTRAMUSCULAR | Status: AC
  Filled 2013-03-08: qty 1000

## 2013-03-08 NOTE — Progress Notes (Signed)
  Echocardiogram 2D Echocardiogram has been performed.  Georgian Co 03/08/2013, 9:08 AM

## 2013-03-08 NOTE — Interval H&P Note (Signed)
History and Physical Interval Note:  03/08/2013 9:54 AM  George Cook  has presented today for surgery, with the diagnosis of cp/nonstemi  The various methods of treatment have been discussed with the patient and family. After consideration of risks, benefits and other options for treatment, the patient has consented to  Procedure(s): LEFT HEART CATHETERIZATION WITH CORONARY ANGIOGRAM (Bilateral) as a surgical intervention .  The patient's history has been reviewed, patient examined, no change in status, stable for surgery.  I have reviewed the patient's chart and labs.  Questions were answered to the patient's satisfaction.     Taelor Moncada [employer]

## 2013-03-08 NOTE — Interval H&P Note (Signed)
Cath Lab Visit (complete for each Cath Lab visit)  Clinical Evaluation Leading to the Procedure:   ACS: yes  Non-ACS:    Anginal Classification: CCS IV  Anti-ischemic medical therapy: No Therapy  Non-Invasive Test Results: No non-invasive testing performed  Prior CABG: No previous CABG      History and Physical Interval Note:  03/08/2013 9:55 AM  George Cook  has presented today for surgery, with the diagnosis of cp/nonstemi  The various methods of treatment have been discussed with the patient and family. After consideration of risks, benefits and other options for treatment, the patient has consented to  Procedure(s): LEFT HEART CATHETERIZATION WITH CORONARY ANGIOGRAM (Bilateral) as a surgical intervention .  The patient's history has been reviewed, patient examined, no change in status, stable for surgery.  I have reviewed the patient's chart and labs.  Questions were answered to the patient's satisfaction.     Sovereign Ramiro [employer]

## 2013-03-08 NOTE — CV Procedure (Signed)
    Cardiac Catheterization Procedure Note  Name: George Cook MRN: [id number] DOB: 12/16/1927  Procedure: Left Heart Cath, Selective Coronary Angiography, LV angiography  Indication: NSTEMI   Procedural Details: Allen's test was positive on the right wrist.  The right wrist was prepped, draped, and anesthetized with 1% lidocaine. Using the modified Seldinger technique, a 5 French sheath was introduced into the right radial artery. 3 mg of verapamil was administered through the sheath, weight-based unfractionated heparin was administered intravenously. JL3.5, MP catheter, and pigtail were used for selective coronary angiography and left ventriculography. Catheter exchanges were performed over an exchange length guidewire. There were no immediate procedural complications. A TR band was used for radial hemostasis at the completion of the procedure.  The patient was transferred to the post catheterization recovery area for further monitoring.  Procedural Findings: Hemodynamics: AO 146/74 LV 146/22  Coronary angiography: Coronary dominance: right  Left mainstem: 30% distal LM stenosis.   Left anterior descending (LAD): 40% proximal LAD stenosis between D1 and D2.  Luminal irregularities mid to distal LAD.   Left circumflex (LCx): 40% proximal LCx stenosis, 40% ostial OM1 stenosis.   Right coronary artery (RCA): Luminal irregularities in the RCA.   Left ventriculography: Left ventricular systolic function is normal, LVEF is estimated at 55-60% with no regional wall motion abnormalities in the RAO projection, there is no significant mitral regurgitation   Final Conclusions:  No significant obstructive coronary disease.  Normal EF.  No explanation for elevated troponin.  TnI was normal this morning.  Patient no longer has chest pain.  Will get D dimer and if elevated, will get V/Q scan.  Possible myopericarditis => echo to assess for pericardial effusion.  Given elevated TnI, will keep today.   Home tomorrow if remains stable.   [name] 03/08/2013, 10:41 AM

## 2013-03-08 NOTE — Progress Notes (Signed)
  Thank you for allowing pharmacy to be a part of this patients care team.  Lovenia Kim Pharm.D., BCPS Clinical Pharmacist 03/08/2013 11:34 AM Pager: [phone] Phone: [phone]

## 2013-03-08 NOTE — Progress Notes (Addendum)
ANTICOAGULATION CONSULT NOTE - Follow up Consult  Pharmacy Consult for Heparin Indication: NSTEMI  Allergies  Allergen Reactions  . Aspirin Hives and Rash    Patient Measurements: Height: 5' 10.47" (179 cm) Weight: 212 lb 1.3 oz (96.2 kg) IBW/kg (Calculated) : 74.09 Heparin Dosing Weight: 101 kg   Vital Signs: Temp: 97.4 F (36.3 C) (10/16 0725) Temp src: Oral (10/16 0725) BP: 136/60 mmHg (10/16 0725) Pulse Rate: 56 (10/16 0725)  Labs:  Recent Labs  03/07/13 1120  03/07/13 1833 03/07/13 2246 03/08/13 0430 03/08/13 0432 03/08/13 0539  HGB 14.6  --   --   --  13.0  --   --   HCT 43.2  --   --   --  39.5  --   --   PLT 139*  --   --   --  134*  --   --   LABPROT  --   --   --   --   --   --  13.7  INR  --   --   --   --   --   --  1.07  HEPARINUNFRC  --   --   --  0.55 0.62  --   --   CREATININE 0.87  --   --   --  1.00  --   --   TROPONINI  --   < > 0.48* 0.36*  --  <0.30  --   < > = values in this interval not displayed.  Estimated Creatinine Clearance: 65.6 ml/min (by C-G formula based on Cr of 1).   Assessment: CP 79 y/o M awoke on 10/15 with 10/10 CP. Patient has elevated troponin 0.29. Note ASA allergy.    Anticoagulation:  Hgb/Hct trending down. 14.6/43.2>>13/39.5. Plt 134 Will watch. No documented s/s of bleeding.  Cardiovascular: On heparin and plavix, lopressor, lipitor, lisinopril   Goal of Therapy:  Heparin level 0.3-0.7 units/ml Monitor platelets by anticoagulation protocol: Yes   Plan:  Continue heparin infusion at 1400 units/hr, at goal  Monitor daily AM heparin levels     PM addendum VQ scan + PE Xarelto 15mg  BID x21 days started tonight then 20mg  daily  Leota Sauers Pharm.D. CPP, BCPS Clinical Pharmacist [phone] 03/08/2013 5:38 PM

## 2013-03-08 NOTE — Progress Notes (Signed)
To the cath lab  By bed, stable. 

## 2013-03-08 NOTE — Progress Notes (Signed)
To nuclear medicine for VQ scan.

## 2013-03-08 NOTE — Progress Notes (Signed)
Nuclear med . Relayed the result of VQ scan which is 2 segmental perfusion defect consistent of pulmonary embolism. Dr Clifton James made aware . Pt placed on xarelto.

## 2013-03-08 NOTE — Progress Notes (Signed)
TR band removed and 2x2 gauze and tegaderm applied. No bleeding noted.

## 2013-03-09 DIAGNOSIS — I2699 Other pulmonary embolism without acute cor pulmonale: Secondary | ICD-10-CM

## 2013-03-09 LAB — BASIC METABOLIC PANEL
CO2: 23 mEq/L (ref 19–32)
GFR calc non Af Amer: 81 mL/min — ABNORMAL LOW (ref >90)
Glucose, Bld: 190 mg/dL — ABNORMAL HIGH (ref 70–99)
Potassium: 5.1 mEq/L (ref 3.5–5.1)
Sodium: 134 mEq/L — ABNORMAL LOW (ref 135–145)

## 2013-03-09 LAB — GLUCOSE, CAPILLARY
Glucose-Capillary: 108 mg/dL — ABNORMAL HIGH (ref 70–99)
Glucose-Capillary: 129 mg/dL — ABNORMAL HIGH (ref 70–99)
Glucose-Capillary: 179 mg/dL — ABNORMAL HIGH (ref 70–99)
Glucose-Capillary: 141 mg/dL — ABNORMAL HIGH (ref 70–99)

## 2013-03-09 LAB — CBC
HCT: 40.6 % (ref 39.0–52.0)
Hemoglobin: 13.7 g/dL (ref 13.0–17.0)
MCV: 86.6 fL (ref 78.0–100.0)
Platelets: 127 10*3/uL — ABNORMAL LOW (ref 150–400)
RBC: 4.69 MIL/uL (ref 4.22–5.81)
WBC: 7.8 10*3/uL (ref 4.0–10.5)

## 2013-03-09 MED ORDER — LISINOPRIL 5 MG PO TABS
5.0000 mg | ORAL_TABLET | Freq: Every day | ORAL | Status: DC
  Administered 2013-03-09 – 2013-03-11 (×3): 5 mg via ORAL
  Filled 2013-03-09 (×4): qty 1

## 2013-03-09 MED ORDER — CARVEDILOL 6.25 MG PO TABS
6.2500 mg | ORAL_TABLET | Freq: Two times a day (BID) | ORAL | Status: DC
  Administered 2013-03-09 – 2013-03-11 (×4): 6.25 mg via ORAL
  Filled 2013-03-09 (×6): qty 1

## 2013-03-09 NOTE — Progress Notes (Addendum)
Patient Name: George Cook Date of Encounter: 03/09/2013  Principal Problem:   Pulmonary embolism, bilateral Active Problems:   Essential hypertension   Diabetes    SUBJECTIVE: C/O SOB, better with oxygen. No chest pain. Not very active at home.  OBJECTIVE Filed Vitals:   03/08/13 2009 03/08/13 2343 03/09/13 0409 03/09/13 0500  BP: 163/71 148/68 193/72 196/71  Pulse:    65  Temp: 97.6 F (36.4 C) 98.2 F (36.8 C)  98 F (36.7 C)  TempSrc: Oral Oral  Oral  Resp:      Height:      Weight:    213 lb 6.5 oz (96.8 kg)  SpO2: 99% 98%  92%    Intake/Output Summary (Last 24 hours) at 03/09/13 0721 Last data filed at 03/09/13 0600  Gross per 24 hour  Intake   1096 ml  Output    930 ml  Net    166 ml   Filed Weights   03/07/13 1716 03/08/13 0500 03/09/13 0500  Weight: 201 lb 15.1 oz (91.6 kg) 212 lb 1.3 oz (96.2 kg) 213 lb 6.5 oz (96.8 kg)    PHYSICAL EXAM General: Well developed, well nourished, male in no acute distress. Head: Normocephalic, atraumatic.  Neck: Supple without bruits, JVD not elevated Lungs:  Resp regular and unlabored, few rales bases. Heart: RRR, S1, S2, no S3, S4, 2/6 murmur; no rub. Abdomen: Soft, non-tender, non-distended, BS + x 4.  Extremities: No clubbing, cyanosis, no edema. Cath site without ecchymosis/hematoma Neuro: Alert and oriented X 3. Moves all extremities spontaneously. Psych: Normal affect.  LABS: CBC:  Recent Labs  03/08/13 0430 03/09/13 0513  WBC 8.1 7.8  HGB 13.0 13.7  HCT 39.5 40.6  MCV 87.2 86.6  PLT 134* 127*   INR:  Recent Labs  03/08/13 0539  INR 1.07   Basic Metabolic Panel:  Recent Labs  16/10/96 1120 03/08/13 0430  NA 137 138  K 4.6 4.6  CL 100 101  CO2 27 29  GLUCOSE 306* 152*  BUN 23 20  CREATININE 0.87 1.00  CALCIUM 9.6 8.5   Liver Function Tests:  Recent Labs  03/08/13 0430  AST 20  ALT 21  ALKPHOS 28*  BILITOT 0.3  PROT 5.9*  ALBUMIN 2.9*   Cardiac Enzymes:  Recent  Labs  03/07/13 1833 03/07/13 2246 03/08/13 0432  TROPONINI 0.48* 0.36* <0.30    Recent Labs  03/07/13 1132  TROPIPOC 0.29*   BNP: Pro B Natriuretic peptide (BNP)  Date/Time Value Range Status  03/07/2013 11:20 AM 169.7  0 - 450 pg/mL Final  05/16/2009 12:30 AM <30.0  0.0 - 100.0 pg/mL Final   D-dimer:  Recent Labs  03/08/13 0539 03/08/13 1045  DDIMER 3.79* 2.99*   Fasting Lipid Panel:  Recent Labs  03/08/13 0430  CHOL 134  HDL 40  LDLCALC 59  TRIG 173*  CHOLHDL 3.4   Thyroid Function Tests:  Recent Labs  03/07/13 1834  TSH 1.803    TELE:  SR, PACs; sinus brady while asleep   ECG: 03/08/2013 SR, 1st degree block, occ PACs HR 69  Echo: 03/08/2013 Study Conclusions - Left ventricle: The cavity size was normal. Wall thickness was normal. Systolic function was normal. The estimated ejection fraction was in the range of 50% to 55%. Wall motion was normal; there were no regional wall motion abnormalities. Doppler parameters are consistent with abnormal left ventricular relaxation (grade 1 diastolic dysfunction). - Aortic valve: Cusp separation was reduced. Valve  mobility was restricted. There was moderate stenosis. Trivial regurgitation. Valve area: 1.35cm^2(VTI). Valve area: 1.4cm^2 (Vmax). - Right ventricle: The cavity size was mildly dilated. Systolic function was mildly reduced. The estimated peak pressure was 50mm Hg. - Atrial septum: No defect or patent foramen ovale was identified. - Tricuspid valve: Mild-moderate regurgitation. - Pulmonary arteries: PA peak pressure: 50mm Hg (S).  Radiology/Studies: Nm Pulmonary Perf And Vent 03/08/2013   CLINICAL DATA:  Chest pain. Elevated D-dimer.  EXAM: NUCLEAR MEDICINE VENTILATION - PERFUSION LUNG SCAN  TECHNIQUE: Ventilation images were obtained in multiple projections using inhaled aerosol technetium 99 M DTPA. Perfusion images were obtained in multiple projections after intravenous injection of  Tc-61m MAA.  COMPARISON:  Chest x-rays dated 03/07/2013 and 08/07/2012  RADIOPHARMACEUTICALS:  40 mCi Tc-7m DTPA aerosol and 6 mCi Tc-30m MAA  FINDINGS: Ventilation: No significant focal ventilation defect.  Perfusion: There are wedge-shaped perfusion defects in right middle lobe and in the left lower lobe posteriorly.  IMPRESSION: Two segmental perfusion defects consistent with pulmonary embolism.   Electronically Signed   By: Geanie Cooley M.D.   On: 03/08/2013 16:23   Dg Chest Port 1 View 03/07/2013   CLINICAL DATA:  Chest pain  EXAM: PORTABLE CHEST - 1 VIEW  COMPARISON:  02/14/2013  FINDINGS: Mild cardiac enlargement. Negative for heart failure. Lungs are clear without infiltrate effusion or mass.  IMPRESSION: No active disease.   Electronically Signed   By: Marlan Palau M.D.   On: 03/07/2013 11:33   Current Medications:  . atorvastatin  80 mg Oral q1800  . calcium-vitamin D  1 tablet Oral Daily  . clopidogrel  75 mg Oral Daily  . donepezil  10 mg Oral QHS  . glimepiride  2 mg Oral QAC breakfast  . insulin aspart  0-15 Units Subcutaneous TID WC  . lisinopril  2.5 mg Oral Daily  . metoprolol tartrate  25 mg Oral BID  . mirabegron ER  50 mg Oral Daily  . multivitamin with minerals  1 tablet Oral Daily  . nitroGLYCERIN  0.5 inch Topical Q6H  . Rivaroxaban  15 mg Oral BID WC  . sodium chloride  3 mL Intravenous Q12H   . sodium chloride 1 mL/kg/hr (03/08/13 0351)    ASSESSMENT AND PLAN: Principal Problem:   Pulmonary embolism, bilateral - Dx 10/16, Xarelto started last pm. MD advise how soon pt can increase activity. He lives with wife, she is able to help him some.  Active Problems:   Essential hypertension - SBP range 119-193 last 24 hours. Med changes per MD.    Diabetes - continue home Rx.     NSTEMI - mild elevation in troponin, 30-40 % lesions in multiple vessels at cath, EF preserved. Likely secondary to PE. On ASA, BB, Lipitor 80, lipid profile above, MD advise on d/c statin  dose.  Plan - d/c when medically stable.  Signed, Theodore Demark , PA-C 7:21 AM 03/09/2013  Patient seen with PA, agree with the above note.  1. CAD: Nonobstructive.  Can decrease statin dose.  Can stop Plavix as he will be on Xarelto.  2. PE: This was the cause of elevated troponin.  There is RV strain but patient is hemodynamically stable (hypertensive).  Xarelto started yesterday.  Will get lower extremity dopplers.  Patient is extremely inactive at home.  As this is unlikely to change, he probably will need Xarelto long-term.  3. AS: moderate.  Monitor in future.  4. HTN: Can increase lisinopril, change metoprolol to  Coreg.  5. Needs to mobilize with PT today.   [name] 03/09/2013 8:15 AM

## 2013-03-09 NOTE — Progress Notes (Signed)
VASCULAR LAB PRELIMINARY  PRELIMINARY  PRELIMINARY  PRELIMINARY  Bilateral lower extremity venous duplex  completed.    Preliminary report:  Right:  Acute DVT noted in the proximal popliteal vein and gastrocnemius vein and superficial thrombosis in the lesser saphenous vein.  No Baker's cyst.  Left:  No evidence of acute  DVT or superficial thrombosis.  Chronic changes in the popliteal vein. Baker's cyst noted in the popliteal space.  Westlee Devita, RVT 03/09/2013, 10:12 AM

## 2013-03-09 NOTE — Progress Notes (Signed)
CARDIAC REHAB PHASE I   PRE:  Rate/Rhythm: 70SR  BP:  Supine: 167/79  Sitting:   Standing:    SaO2: 96%RA  MODE:  Ambulation: 240 ft   POST:  Rate/Rhythm: 78SR  BP:  Supine:   Sitting: 165/80  Standing:    SaO2: would not register on ear or finger 1150-1240 Put recliner in room for pt to sit up. Walked 240 ft on RA with rolling walker and asst x 1. Gait steady and tolerated well. Pt had to urinate upon entering room and could not control it . Incontinent. Assisted to bathroom. Pt's urine is very foul smelling. He denied burning. Notified pt's RN that pt may need UA to r/o UTI. Changed gown and washed pt up. NT put on condom cath as pt had already been incontinent earlier. To recliner with call bell. Could not get sats to register. No DOE.   Luetta Nutting, RN BSN  03/09/2013 12:34 PM

## 2013-03-10 LAB — BASIC METABOLIC PANEL
BUN: 16 mg/dL (ref 6–23)
Chloride: 100 mEq/L (ref 96–112)
Creatinine, Ser: 0.85 mg/dL (ref 0.50–1.35)
GFR calc Af Amer: >90 mL/min (ref >90)
GFR calc non Af Amer: 78 mL/min — ABNORMAL LOW (ref >90)
Potassium: 4.5 mEq/L (ref 3.5–5.1)
Sodium: 136 mEq/L (ref 135–145)

## 2013-03-10 LAB — CBC
HCT: 43 % (ref 39.0–52.0)
Hemoglobin: 14.3 g/dL (ref 13.0–17.0)
MCH: 28.2 pg (ref 26.0–34.0)
MCHC: 33.3 g/dL (ref 30.0–36.0)
Platelets: 149 10*3/uL — ABNORMAL LOW (ref 150–400)
RDW: 13.2 % (ref 11.5–15.5)
WBC: 9.7 10*3/uL (ref 4.0–10.5)

## 2013-03-10 LAB — GLUCOSE, CAPILLARY
Glucose-Capillary: 143 mg/dL — ABNORMAL HIGH (ref 70–99)
Glucose-Capillary: 154 mg/dL — ABNORMAL HIGH (ref 70–99)

## 2013-03-10 NOTE — Evaluation (Signed)
Physical Therapy Evaluation Patient Details Name: George Cook MRN: [id number] DOB: [birth year] Today's Date: 03/10/2013 Time: 1308-6578 PT Time Calculation (min): 20 min  PT Assessment / Plan / Recommendation History of Present Illness  79 year old male without prior history of CAD who presents with chest pain and elevated troponin. bil PE and RLE DVT  Clinical Impression  Pt is a pleasant gentleman who is a high fall risk with report of greater than 10 falls in the last year with inability to stand after falls with need for assist of 1-2 people each time to rise from fall. Pt reaching out for environmental supports during short grossly 8' trial of gait without AD and with RW needed constant cueing. Pt and wife educated for need to use RW at all times, use shower chair during entire bath, and to have supervision at all times, both verbalized understanding. Pt with these as well as below deficits and will benefit from acute as well as HHPT to maximize function, mobility and balance to decrease falls and burden of care and increase safety.     PT Assessment  Patient needs continued PT services    Follow Up Recommendations  Home health PT;Supervision/Assistance - 24 hour    Does the patient have the potential to tolerate intense rehabilitation      Barriers to Discharge Decreased caregiver support      Equipment Recommendations  Rolling walker with 5" wheels    Recommendations for Other Services OT consult   Frequency Min 3X/week    Precautions / Restrictions Precautions Precautions: Fall   Pertinent Vitals/Pain No pain HR 76-88 No SOB     Mobility  Bed Mobility Bed Mobility: Sit to Supine;Supine to Sit Supine to Sit: 4: Min assist;HOB flat Sit to Supine: 5: Supervision Details for Bed Mobility Assistance: pt with decreased control of descent and "flops"onto bed. Pt required hand held assist to elevate trunk from surface Transfers Transfers: Sit to Stand;Stand to  Sit;Stand Pivot Transfers Sit to Stand: 4: Min guard;From bed;From chair/3-in-1 Stand to Sit: 4: Min guard;To chair/3-in-1;To bed Stand Pivot Transfers: 4: Min guard Details for Transfer Assistance: cueing for hand placement and safety as pt with decreased control of descent Ambulation/Gait Ambulation/Gait Assistance: 5: Supervision Ambulation Distance (Feet): 500 Feet Assistive device: Rolling walker Ambulation/Gait Assistance Details: max cueing throughout for posture and position in RW as pt reverts to flexion with body posterior to RW Gait Pattern: Step-through pattern;Decreased stride length;Trunk flexed Gait velocity: decreased Stairs: No    Exercises     PT Diagnosis: Difficulty walking  PT Problem List: Decreased activity tolerance;Decreased mobility;Decreased knowledge of use of DME;Decreased safety awareness;Decreased balance PT Treatment Interventions: Gait training;Stair training;Therapeutic exercise;Therapeutic activities;DME instruction;Functional mobility training;Balance training;Patient/family education     PT Goals(Current goals can be found in the care plan section) Acute Rehab PT Goals Patient Stated Goal: " I want to go home" PT Goal Formulation: With patient/family Time For Goal Achievement: 03/24/13 Potential to Achieve Goals: Good  Visit Information  Last PT Received On: 03/10/13 Assistance Needed: +1 History of Present Illness: 79 year old male without prior history of CAD who presents with chest pain and elevated troponin. bil PE and RLE DVT       Prior Functioning  Home Living Family/patient expects to be discharged to:: Private residence Living Arrangements: Spouse/significant other Available Help at Discharge: Family;Available 24 hours/day Type of Home: House Home Access: Stairs to enter [employer] of Steps: 1 Home Layout: One level Home Equipment: Walker - standard;Shower seat;Grab  bars - toilet;Grab bars - tub/shower Additional  Comments: pt with elevated toilet seat in bathroom, walk-in shower  Prior Function Level of Independence: Independent Communication Communication: No difficulties    Cognition  Cognition Arousal/Alertness: Awake/alert Behavior During Therapy: WFL for tasks assessed/performed Overall Cognitive Status: Impaired/Different from baseline Area of Impairment: Safety/judgement Safety/Judgement: Decreased awareness of safety;Decreased awareness of deficits    Extremity/Trunk Assessment Upper Extremity Assessment Upper Extremity Assessment: Generalized weakness Lower Extremity Assessment Lower Extremity Assessment: Generalized weakness Cervical / Trunk Assessment Cervical / Trunk Assessment: Normal   Balance Balance Balance Assessed: Yes Static Sitting Balance Static Sitting - Balance Support: Feet supported Static Sitting - Level of Assistance: 6: Modified independent (Device/Increase time) Static Sitting - Comment/# of Minutes: 2 Static Standing Balance Static Standing - Balance Support: Right upper extremity supported Static Standing - Level of Assistance: 5: Stand by assistance Static Standing - Comment/# of Minutes: 1- pt unable to balance without UE assist  End of Session PT - End of Session Equipment Utilized During Treatment: Gait belt Activity Tolerance: Patient tolerated treatment well Patient left: in chair;with call bell/phone within reach;with family/visitor present Nurse Communication: Mobility status  GP     Delorse Lek 03/10/2013, 12:53 PM Delaney Meigs, PT [phone]

## 2013-03-10 NOTE — Progress Notes (Signed)
Patient ID: George Cook, male   DOB: 1927/07/10, 79 y.o.   MRN: [id number]     Patient Name: George Cook Date of Encounter: 03/10/2013  Principal Problem:   Pulmonary embolism, bilateral Active Problems:   Essential hypertension   Diabetes    SUBJECTIVE: Wants to go home But has not been OOB much  OBJECTIVE Filed Vitals:   03/10/13 0300 03/10/13 0402 03/10/13 0600 03/10/13 0747  BP:  161/75  144/84  Pulse:    75  Temp: 97.5 F (36.4 C)   97.2 F (36.2 C)  TempSrc: Oral   Oral  Resp:    17  Height:      Weight:   209 lb 14.1 oz (95.2 kg)   SpO2:  92%  95%    Intake/Output Summary (Last 24 hours) at 03/10/13 7829 Last data filed at 03/09/13 2000  Gross per 24 hour  Intake    480 ml  Output    340 ml  Net    140 ml   Filed Weights   03/08/13 0500 03/09/13 0500 03/10/13 0600  Weight: 212 lb 1.3 oz (96.2 kg) 213 lb 6.5 oz (96.8 kg) 209 lb 14.1 oz (95.2 kg)    PHYSICAL EXAM General: Well developed, well nourished, male in no acute distress. Head: Normocephalic, atraumatic.  Neck: Supple without bruits, JVD not elevated Lungs:  Resp regular and unlabored, few rales bases. Heart: RRR, S1, S2, no S3, S4, 2/6 murmur; no rub. Abdomen: Soft, non-tender, non-distended, BS + x 4.  Extremities: No clubbing, cyanosis, no edema. Cath site without ecchymosis/hematoma Neuro: Alert and oriented X 3. Moves all extremities spontaneously. Psych: Normal affect.  LABS: CBC:  Recent Labs  03/09/13 0513 03/10/13 0408  WBC 7.8 9.7  HGB 13.7 14.3  HCT 40.6 43.0  MCV 86.6 84.8  PLT 127* 149*   INR:  Recent Labs  03/08/13 0539  INR 1.07   Basic Metabolic Panel:  Recent Labs  56/21/30 0930 03/10/13 0408  NA 134* 136  K 5.1 4.5  CL 98 100  CO2 23 25  GLUCOSE 190* 141*  BUN 15 16  CREATININE 0.78 0.85  CALCIUM 9.0 9.1   Liver Function Tests:  Recent Labs  03/08/13 0430  AST 20  ALT 21  ALKPHOS 28*  BILITOT 0.3  PROT 5.9*  ALBUMIN 2.9*   Cardiac  Enzymes:  Recent Labs  03/07/13 1833 03/07/13 2246 03/08/13 0432  TROPONINI 0.48* 0.36* <0.30    Recent Labs  03/07/13 1132  TROPIPOC 0.29*   BNP: Pro B Natriuretic peptide (BNP)  Date/Time Value Range Status  03/07/2013 11:20 AM 169.7  0 - 450 pg/mL Final  05/16/2009 12:30 AM <30.0  0.0 - 100.0 pg/mL Final   D-dimer:  Recent Labs  03/08/13 0539 03/08/13 1045  DDIMER 3.79* 2.99*   Fasting Lipid Panel:  Recent Labs  03/08/13 0430  CHOL 134  HDL 40  LDLCALC 59  TRIG 173*  CHOLHDL 3.4   Thyroid Function Tests:  Recent Labs  03/07/13 1834  TSH 1.803    TELE:  SR, PACs; sinus brady while asleep   ECG: 03/08/2013 SR, 1st degree block, occ PACs HR 69  Echo: 03/08/2013 Study Conclusions - Left ventricle: The cavity size was normal. Wall thickness was normal. Systolic function was normal. The estimated ejection fraction was in the range of 50% to 55%. Wall motion was normal; there were no regional wall motion abnormalities. Doppler parameters are consistent with abnormal left ventricular relaxation (  grade 1 diastolic dysfunction). - Aortic valve: Cusp separation was reduced. Valve mobility was restricted. There was moderate stenosis. Trivial regurgitation. Valve area: 1.35cm^2(VTI). Valve area: 1.4cm^2 (Vmax). - Right ventricle: The cavity size was mildly dilated. Systolic function was mildly reduced. The estimated peak pressure was 50mm Hg. - Atrial septum: No defect or patent foramen ovale was identified. - Tricuspid valve: Mild-moderate regurgitation. - Pulmonary arteries: PA peak pressure: 50mm Hg (S).  Radiology/Studies: Nm Pulmonary Perf And Vent 03/08/2013   CLINICAL DATA:  Chest pain. Elevated D-dimer.  EXAM: NUCLEAR MEDICINE VENTILATION - PERFUSION LUNG SCAN  TECHNIQUE: Ventilation images were obtained in multiple projections using inhaled aerosol technetium 99 M DTPA. Perfusion images were obtained in multiple projections after intravenous  injection of Tc-70m MAA.  COMPARISON:  Chest x-rays dated 03/07/2013 and 08/07/2012  RADIOPHARMACEUTICALS:  40 mCi Tc-76m DTPA aerosol and 6 mCi Tc-78m MAA  FINDINGS: Ventilation: No significant focal ventilation defect.  Perfusion: There are wedge-shaped perfusion defects in right middle lobe and in the left lower lobe posteriorly.  IMPRESSION: Two segmental perfusion defects consistent with pulmonary embolism.   Electronically Signed   By: Geanie Cooley M.D.   On: 03/08/2013 16:23   Dg Chest Port 1 View 03/07/2013   CLINICAL DATA:  Chest pain  EXAM: PORTABLE CHEST - 1 VIEW  COMPARISON:  02/14/2013  FINDINGS: Mild cardiac enlargement. Negative for heart failure. Lungs are clear without infiltrate effusion or mass.  IMPRESSION: No active disease.   Electronically Signed   By: Marlan Palau M.D.   On: 03/07/2013 11:33   Current Medications:  . atorvastatin  80 mg Oral q1800  . calcium-vitamin D  1 tablet Oral Daily  . carvedilol  6.25 mg Oral BID WC  . donepezil  10 mg Oral QHS  . glimepiride  2 mg Oral QAC breakfast  . insulin aspart  0-15 Units Subcutaneous TID WC  . lisinopril  5 mg Oral Daily  . mirabegron ER  50 mg Oral Daily  . multivitamin with minerals  1 tablet Oral Daily  . nitroGLYCERIN  0.5 inch Topical Q6H  . Rivaroxaban  15 mg Oral BID WC      ASSESSMENT AND PLAN: Principal Problem:   Pulmonary embolism, bilateral - Dx 10/16, Xarelto started 10/16. MD advise how soon pt can increase activity. He lives with wife, she is able to help him some.  Active Problems:   Essential hypertension - SBP range 119-193 last 24 hours. Med changes per MD.    Diabetes - continue home Rx.     NSTEMI - mild elevation in troponin, 30-40 % lesions in multiple vessels at cath, EF preserved. Likely secondary to PE. On ASA, BB, Lipitor 80, lipid profile above, MD advise on d/c statin dose.  Plan - d/c when medically stable.  Vira Blanco , PA-C 9:24 AM 03/10/2013  Patient seen with  PA, agree with the above note.  1. CAD: Nonobstructive.  Can decrease statin dose.  Can stop Plavix as he will be on Xarelto.  2. PE: This was the cause of elevated troponin.  There is RV strain but patient is hemodynamically stable (hypertensive).  Xarelto started 10/16.  15mg  bid PE dosing  . LE duplex confirms right popliteal DVT  Patient is extremely inactive at home.  As this is unlikely to change, he probably will need Xarelto long-term.  3. AS: moderate.  Monitor in future.  4. HTN: Can increase lisinopril, change metoprolol to Coreg.  5. Needs to mobilize  with PT today.   D/C home in am  Charlton Haws 03/10/2013 9:24 AM

## 2013-03-10 NOTE — Progress Notes (Signed)
CARDIAC REHAB PHASE I   PRE:  Rate/Rhythm: 94 SR   BP:  Sitting: 139/85     SaO2: 94% RA  MODE:  Ambulation: 300 ft   POST:  Rate/Rhythm: 93 SR  BP:  Sitting: 125/60     SaO2: 95% RA  2:08PM-2:32PM Patient tolerated walk well.  Patient was assisted x1 with walker.  Patient conversed the entire time and did not complain of any pain but was short of breath.  Patient is very eager to go home. Patient is interested in cardiac rehab and states he has attended before.   Theresa Duty, [place] 03/10/2013 2:30 PM

## 2013-03-10 NOTE — Progress Notes (Deleted)
Speech therapist in to evaluate patient and she will not wake up. She is very lethargic and even with sternal rub she is still not waking up enough to safely be evaluated. She will not respond to verbal or physical stimulation and only briefly opened her eyes. Speech therapist unable to complete her assessment and therefore patient must remain NPO. Will continue to assess and monitor.

## 2013-03-11 ENCOUNTER — Other Ambulatory Visit: Payer: Self-pay | Admitting: Physician Assistant

## 2013-03-11 DIAGNOSIS — I82401 Acute embolism and thrombosis of unspecified deep veins of right lower extremity: Secondary | ICD-10-CM

## 2013-03-11 DIAGNOSIS — I350 Nonrheumatic aortic (valve) stenosis: Secondary | ICD-10-CM

## 2013-03-11 DIAGNOSIS — I2510 Atherosclerotic heart disease of native coronary artery without angina pectoris: Secondary | ICD-10-CM

## 2013-03-11 DIAGNOSIS — R8290 Unspecified abnormal findings in urine: Secondary | ICD-10-CM

## 2013-03-11 DIAGNOSIS — E119 Type 2 diabetes mellitus without complications: Secondary | ICD-10-CM

## 2013-03-11 LAB — URINALYSIS, ROUTINE W REFLEX MICROSCOPIC
Glucose, UA: 250 mg/dL — AB
Ketones, ur: 15 mg/dL — AB
Specific Gravity, Urine: 1.026 (ref 1.005–1.030)
pH: 5 (ref 5.0–8.0)

## 2013-03-11 LAB — CBC
Hemoglobin: 13.1 g/dL (ref 13.0–17.0)
MCH: 28.4 pg (ref 26.0–34.0)
MCHC: 33.5 g/dL (ref 30.0–36.0)
Platelets: 146 10*3/uL — ABNORMAL LOW (ref 150–400)
RBC: 4.62 MIL/uL (ref 4.22–5.81)
RDW: 13.3 % (ref 11.5–15.5)

## 2013-03-11 LAB — GLUCOSE, CAPILLARY: Glucose-Capillary: 216 mg/dL — ABNORMAL HIGH (ref 70–99)

## 2013-03-11 LAB — URINE MICROSCOPIC-ADD ON

## 2013-03-11 MED ORDER — LISINOPRIL 5 MG PO TABS: 5.0000 mg | ORAL_TABLET | Freq: Every day | ORAL | Status: DC

## 2013-03-11 MED ORDER — NITROGLYCERIN 0.4 MG SL SUBL: 0.4000 mg | SUBLINGUAL_TABLET | SUBLINGUAL | Status: DC | PRN

## 2013-03-11 MED ORDER — CARVEDILOL 6.25 MG PO TABS: 6.2500 mg | ORAL_TABLET | Freq: Two times a day (BID) | ORAL | Status: DC

## 2013-03-11 MED ORDER — ATORVASTATIN CALCIUM 80 MG PO TABS: 80.0000 mg | ORAL_TABLET | Freq: Every day | ORAL | Status: DC

## 2013-03-11 MED ORDER — CIPROFLOXACIN HCL 500 MG PO TABS: 500.0000 mg | ORAL_TABLET | Freq: Two times a day (BID) | ORAL | Status: DC

## 2013-03-11 MED ORDER — RIVAROXABAN 15 MG PO TABS: 15.0000 mg | ORAL_TABLET | Freq: Two times a day (BID) | ORAL | Status: DC

## 2013-03-11 MED ORDER — RIVAROXABAN 20 MG PO TABS: 20.0000 mg | ORAL_TABLET | Freq: Every day | ORAL | Status: DC

## 2013-03-11 NOTE — Discharge Summary (Signed)
Discharge Summary   Patient ID: George Cook,  MRN: [id number], DOB/AGE: Jan 19, 1928 79 y.o.  Admit date: 03/07/2013 Discharge date: 03/11/2013  Primary Physician: Fredirick Maudlin, MD Primary Cardiologist: Roosvelt Maser  Discharge Diagnoses Principal Problem:   Pulmonary embolism, bilateral Active Problems:   Type 2 diabetes mellitus   CAD (coronary artery disease)   Moderate aortic stenosis   Right leg DVT   Occlusion and stenosis of carotid artery without mention of cerebral infarction   Essential hypertension   Abnormal urine odor   Allergies Allergies  Allergen Reactions  . Aspirin Hives and Rash    Diagnostic Studies/Procedures  PORTABLE CHEST X-RAY - 03/07/13  IMPRESSION:  No active disease.  CARDIAC CATHETERIZATION - 03/08/13  Hemodynamics:  AO 146/74  LV 146/22  Coronary angiography:  Coronary dominance: right  Left mainstem: 30% distal LM stenosis.  Left anterior descending (LAD): 40% proximal LAD stenosis between D1 and D2. Luminal irregularities mid to distal LAD.  Left circumflex (LCx): 40% proximal LCx stenosis, 40% ostial OM1 stenosis.  Right coronary artery (RCA): Luminal irregularities in the RCA.  Left ventriculography: Left ventricular systolic function is normal, LVEF is estimated at 55-60% with no regional wall motion abnormalities in the RAO projection, there is no significant mitral regurgitation   VQ SCAN - 03/08/13  IMPRESSION:  Two segmental perfusion defects consistent with pulmonary embolism.  TRANSTHORACIC ECHOCARDIOGRAM - 03/08/13  - Left ventricle: The cavity size was normal. Wall thickness was normal. Systolic function was normal. The estimated ejection fraction was in the range of 50% to 55%. Wall motion was normal; there were no regional wall motion abnormalities. Doppler parameters are consistent with abnormal left ventricular relaxation (grade 1 diastolic dysfunction). - Aortic valve: Cusp separation was reduced. Valve  mobility was restricted. There was moderate stenosis. Trivial regurgitation. Valve area: 1.35cm^2(VTI). Valve area: 1.4cm^2 (Vmax). - Right ventricle: The cavity size was mildly dilated. Systolic function was mildly reduced. The estimated peak pressure was 50mm Hg. - Atrial septum: No defect or patent foramen ovale was identified. - Tricuspid valve: Mild-moderate regurgitation. - Pulmonary arteries: PA peak pressure: 50mm Hg (S).  LOWER EXTREMITY DOPPLERS - 03/09/13  - Findings consistent with deep vein thrombosis involving the right popliteal vein and right gastrocnemius vein.  - Findings consistent with superficial vein thrombosis involving the right small saphenous vein. - No evidence of deep vein thrombosis involving the left lower extremity.  History of Present Illness  George Cook is a 79 y.o. male who was admitted to [facility] hospital 10/15 to 03/11/13 with the above problem list.   He has a PMHx s/f CVA, DM2, HTN, h/o DVT, nonobstructive carotid artery disease and ASA allergy. The morning of admission, he developed substernal chest heaviness and pressure with associated mild dyspnea and diaphoresis. The symptoms persisted after an hour and he presented to the ED.   There, symptoms revelieved after 4 hours w/o intervention. EKG indicated NSR, RBBB, 1st degree AVB, but no ischemic changes. Initial TnI elevated a 0.29->0.52. He was heparinized and loaded with Plavix (150 mg- allergic to ASA). He was started on BB, atorvastatin and NTG paste with plans for diagnostic cardiac cath in the AM.    Hospital Course   Tropinin trend indicated a peak at 0.52. He remained stable overnight. He was informed, consented and prepped for cardiac catheterization which was accessed via the R radial artery. As above, this indicated nonobstructive CAD and EF 55-60%. He tolerated the procedure well without complications. To investigate the  patient's troponemia further, a VQ scan was obtained revealing two  segmental perfusion defects consistent with pulmonary embolism. Lower extremity dopplers did also indicate RLE DVT. He was started on Xarelto.   A follow-up echocardiogram, as above, indicated EF 50-55%, grade 1 diastolic dysfunction, moderate AS (VTI 1.35 cm^2, Vmax 1.4cm^2), mild RV dilatation, PASP 50 mmHg, mild-mod TR. Elevated R sided pressures on TTE were consistent with PE findings. He was noted to be hypertensive. Metoprolol was transitioned to carvedilol. He remained hemodynamically stable in CCU. He ambulated well with PT who recommended home health PT.   He was evaluated by Dr. Gala Romney who deemed the patient to be stable for discharge. He will be discharged on the medication regimen outlined below. Importantly, affordability with Xarelto was a concern. He was provided with Xarelto assistance via case management services. He will continue the 15mg  PO BID dosing for PE x 21 days, then 20mg  PO daily indefinitely thereafter (recurrent DVTs). He will need to have pre-authorization completed on follow-up before 30 days as this is the allotted time for Xarelto post-discharge. Home health PT has been arranged. He will be discharged on the medication regimen outlined below. He will follow-up in the [place] office within 7-14 days as part of transitional care management. This information, including post-cath instructions and activity restrictions, has been clearly outlined in the discharge AVS.   The patient did have malodorous urine. U/a has been ordered and will be checked to evaluate for UTI. If positive, will arrange outpatient antibiotic therapy.   LFTs and repeat lipid panel will need to be checked in 6 weeks.   Discharge Vitals:  Blood pressure 122/74, pulse 68, temperature 97.5 F (36.4 C), temperature source Oral, resp. rate 19, height 5' 10.47" (1.79 m), weight 95.3 kg (210 lb 1.6 oz), SpO2 96.00%.   Labs: Recent Labs     03/10/13  0408  03/11/13  0522  WBC  9.7  8.4  HGB  14.3   13.1  HCT  43.0  39.1  MCV  84.8  84.6  PLT  149*  146*    Recent Labs Lab 03/08/13 0430 03/09/13 0930 03/10/13 0408  NA 138 134* 136  K 4.6 5.1 4.5  CL 101 98 100  CO2 29 23 25   BUN 20 15 16   CREATININE 1.00 0.78 0.85  CALCIUM 8.5 9.0 9.1  PROT 5.9*  --   --   BILITOT 0.3  --   --   ALKPHOS 28*  --   --   ALT 21  --   --   AST 20  --   --   GLUCOSE 152* 190* 141*   Disposition:  Discharge Orders   Future Appointments Provider Department Dept Phone   06/12/2013 10:30 AM [facility] [place] [facility] [address] [phone]   06/12/2013 11:40 AM Carma Lair Nickel, NP [facility] [facility] [phone]   Future Orders Complete By Expires   Amb Referral to Cardiac Rehabilitation  As directed    Diet - low sodium heart healthy  As directed    Increase activity slowly  As directed          Follow-up Information   Follow up with [facility] In 1 week. (Office will call you with an appointment date & time within 1 week. )    Contact information:   [address] [place] [address]       Follow up with HAWKINS,EDWARD L, MD In 1 week. (For general  post-hospital follow-up. )    Specialty:  Pulmonary Disease   Contact information:   [address] [place]  [address] [phone]       Discharge Medications:    Medication List    STOP taking these medications       clopidogrel 75 MG tablet  Commonly known as:  PLAVIX     fenofibrate 145 MG tablet  Commonly known as:  TRICOR     NIACIN PO      TAKE these medications       acetaminophen 325 MG tablet  Commonly known as:  TYLENOL  Take 650 mg by mouth every 6 (six) hours as needed for pain.     atorvastatin 80 MG tablet  Commonly known as:  LIPITOR  Take 1 tablet (80 mg total) by mouth daily at 6 PM.     bumetanide 2 MG tablet  Commonly known as:  BUMEX  Take 2 mg by mouth daily as needed (Fluid).     calcium-vitamin D 500-200  MG-UNIT per tablet  Commonly known as:  OSCAL WITH D  Take 1 tablet by mouth daily.     carvedilol 6.25 MG tablet  Commonly known as:  COREG  Take 1 tablet (6.25 mg total) by mouth 2 (two) times daily with a meal.     donepezil 10 MG tablet  Commonly known as:  ARICEPT  Take 10 mg by mouth at bedtime.     glimepiride 2 MG tablet  Commonly known as:  AMARYL  Take 2 mg by mouth daily before breakfast.     lisinopril 5 MG tablet  Commonly known as:  PRINIVIL,ZESTRIL  Take 1 tablet (5 mg total) by mouth daily.     multivitamin with minerals Tabs tablet  Take 1 tablet by mouth daily.     MYRBETRIQ 50 MG Tb24 tablet  Generic drug:  mirabegron ER  Take 50 mg by mouth daily.     nitroGLYCERIN 0.4 MG SL tablet  Commonly known as:  NITROSTAT  Place 1 tablet (0.4 mg total) under the tongue every 5 (five) minutes as needed for chest pain.     Rivaroxaban 15 MG Tabs tablet  Commonly known as:  XARELTO  Take 1 tablet (15 mg total) by mouth 2 (two) times daily with a meal. Start today through 03/30/13.     Rivaroxaban 20 MG Tabs tablet  Commonly known as:  XARELTO  Take 1 tablet (20 mg total) by mouth daily. Start on 03/31/13.  Start taking on:  03/31/2013     vitamin C 1000 MG tablet  Take 1,000 mg by mouth daily.       Outstanding Labs/Studies: LFTs, lipid panel in 6 weeks  Duration of Discharge Encounter: Greater than 30 minutes including physician time.  Signed, R. Hurman Horn, PA-C 03/11/2013, 1:28 PM  Patient seen and examined. D/C summary reviewed. We discussed all aspects of the encounter. I agree with the assessment as stated above. Patient is ok fo d/c. Please see my rounding note from same day for other details.  Daniel Bensimhon,MD 11:35 AM

## 2013-03-11 NOTE — Progress Notes (Signed)
Patient recently discharged from [facility]. U/a checked prior to discharge for malodorous urine-- positive nitrites, large amt leukocytes. Patient is diabetic. Urine culture pending to guide treatment. Will treat empirically with ciprofloxacin 500mg  PO BID x 14 days for now. Rx called into [facility] pharmacy on file. Voicemail left for patient. Instructed to monitor blood sugar while on cipro (concomitant glimepiride use).    Jacqulyn Bath, PA-C 03/11/2013 3:41 PM

## 2013-03-11 NOTE — Care Management Note (Signed)
    Page 1 of 2   03/11/2013     4:09:08 PM   CARE MANAGEMENT NOTE 03/11/2013  Patient:  George Cook, George Cook   Account Number:  [id number]  Date Initiated:  03/11/2013  Documentation initiated by:  [facility]  Subjective/Objective Assessment:   adm: Pulmonary embolism, bilateral     Action/Plan:   discharge planning   Anticipated DC Date:  03/11/2013   Anticipated DC Plan:  HOME W HOME HEALTH SERVICES      DC Planning Services  Medication Assistance  CM consult      [facility] Choice  HOME HEALTH   Choice offered to / List presented to:  C-1 Patient        HH arranged  HH-2 PT      [facility] agency  [facility]   Status of service:  Completed, signed off Medicare Important Message given?   (If response is "NO", the following Medicare IM given date fields will be blank) Date Medicare IM given:   Date Additional Medicare IM given:    Discharge Disposition:  HOME W HOME HEALTH SERVICES  Per UR Regulation:    If discussed at Long Length of Stay Meetings, dates discussed:    Comments:  03/11/13 14:15 Cm spoke to pt in room for choice.  Pt and pt's wife state they have a family member who works with [facility] and want [facility] to provide HHPT.  REferral faxed to [facility] for HHPT.  Address and contact number verifies.  Xarelto free trial card activated for pt for 30 day free trial.  MD states pt will have follow up prior to the expiration of the 30 day free trial card and at that time pre-authorization and possible discount of med can be determined at the folow-up appt.  No other CM needs were commnicated.  Freddy Jaksch, BSN, Cm [phone]

## 2013-03-11 NOTE — Progress Notes (Signed)
Discharge instructions reviewed with patient, spouse, and son at bedside. Copy of discharge instructions, list of medications, and prescription given to patient. They all verbalized understanding of all instructions. Patient discharged to front lobby with family.

## 2013-03-11 NOTE — Progress Notes (Signed)
Patient ID: LESLEE HAUETER, male   DOB: [birth year], 79 y.o.   MRN: [id number]   Patient Name: George Cook Date of Encounter: 03/11/2013  Principal Problem:   Pulmonary embolism, bilateral Active Problems:   Essential hypertension   Diabetes    SUBJECTIVE:  Doing well. Walked with CR without problem. Very eager to go home. SBP recorded as 85 this am but on recheck it was 122 (down from 160).  Denies CP or SOB. No bleeding on Xarelto.   OBJECTIVE Filed Vitals:   03/11/13 0456 03/11/13 0740 03/11/13 1100 03/11/13 1145  BP:  160/65 85/51 99/53   Pulse:  68 67 68  Temp:  98.2 F (36.8 C) 97.5 F (36.4 C)   TempSrc:  Oral Oral   Resp:  19  19  Height:      Weight: 95.3 kg (210 lb 1.6 oz)     SpO2:  95% 96% 96%    Intake/Output Summary (Last 24 hours) at 03/11/13 1139 Last data filed at 03/11/13 0800  Gross per 24 hour  Intake    780 ml  Output    950 ml  Net   -170 ml   Filed Weights   03/09/13 0500 03/10/13 0600 03/11/13 0456  Weight: 96.8 kg (213 lb 6.5 oz) 95.2 kg (209 lb 14.1 oz) 95.3 kg (210 lb 1.6 oz)    PHYSICAL EXAM General: Well developed, well nourished, male in no acute distress. Head: Normocephalic, atraumatic.  Neck: Supple without bruits, JVD not elevated Lungs:  Resp regular and unlabored, few rales bases. Heart: RRR, S1, S2, no S3, S4, 2/6 murmur; no rub. Abdomen: Soft, non-tender, non-distended, BS + x 4.  Extremities: No clubbing, cyanosis, no edema. Cath site without ecchymosis/hematoma Neuro: Alert and oriented X 3. Moves all extremities spontaneously. Psych: Normal affect.  LABS: CBC:  Recent Labs  03/10/13 0408 03/11/13 0522  WBC 9.7 8.4  HGB 14.3 13.1  HCT 43.0 39.1  MCV 84.8 84.6  PLT 149* 146*   INR: No results found for this basename: INR,  in the last 72 hours Basic Metabolic Panel:  Recent Labs  82/95/62 0930 03/10/13 0408  NA 134* 136  K 5.1 4.5  CL 98 100  CO2 23 25  GLUCOSE 190* 141*  BUN 15 16  CREATININE 0.78  0.85  CALCIUM 9.0 9.1   Liver Function Tests: No results found for this basename: AST, ALT, ALKPHOS, BILITOT, PROT, ALBUMIN,  in the last 72 hours Cardiac Enzymes: No results found for this basename: CKTOTAL, CKMB, CKMBINDEX, TROPONINI,  in the last 72 hours No results found for this basename: TROPIPOC,  in the last 72 hours BNP: Pro B Natriuretic peptide (BNP)  Date/Time Value Range Status  03/07/2013 11:20 AM 169.7  0 - 450 pg/mL Final  05/16/2009 12:30 AM <30.0  0.0 - 100.0 pg/mL Final   D-dimer: No results found for this basename: DDIMER,  in the last 72 hours Fasting Lipid Panel: No results found for this basename: CHOL, HDL, LDLCALC, TRIG, CHOLHDL, LDLDIRECT,  in the last 72 hours Thyroid Function Tests: No results found for this basename: TSH, T4TOTAL, FREET3, T3FREE, THYROIDAB,  in the last 72 hours  TELE:  SR, PACs; sinus brady while asleep   ECG: 03/08/2013 SR, 1st degree block, occ PACs HR 69  Echo: 03/08/2013 Study Conclusions - Left ventricle: The cavity size was normal. Wall thickness was normal. Systolic function was normal. The estimated ejection fraction was in the range of 50% to 55%.  Wall motion was normal; there were no regional wall motion abnormalities. Doppler parameters are consistent with abnormal left ventricular relaxation (grade 1 diastolic dysfunction). - Aortic valve: Cusp separation was reduced. Valve mobility was restricted. There was moderate stenosis. Trivial regurgitation. Valve area: 1.35cm^2(VTI). Valve area: 1.4cm^2 (Vmax). - Right ventricle: The cavity size was mildly dilated. Systolic function was mildly reduced. The estimated peak pressure was 50mm Hg. - Atrial septum: No defect or patent foramen ovale was identified. - Tricuspid valve: Mild-moderate regurgitation. - Pulmonary arteries: PA peak pressure: 50mm Hg (S).  Radiology/Studies: Nm Pulmonary Perf And Vent 03/08/2013   CLINICAL DATA:  Chest pain. Elevated D-dimer.   EXAM: NUCLEAR MEDICINE VENTILATION - PERFUSION LUNG SCAN  TECHNIQUE: Ventilation images were obtained in multiple projections using inhaled aerosol technetium 99 M DTPA. Perfusion images were obtained in multiple projections after intravenous injection of Tc-53m MAA.  COMPARISON:  Chest x-rays dated 03/07/2013 and 08/07/2012  RADIOPHARMACEUTICALS:  40 mCi Tc-67m DTPA aerosol and 6 mCi Tc-78m MAA  FINDINGS: Ventilation: No significant focal ventilation defect.  Perfusion: There are wedge-shaped perfusion defects in right middle lobe and in the left lower lobe posteriorly.  IMPRESSION: Two segmental perfusion defects consistent with pulmonary embolism.   Electronically Signed   By: Geanie Cooley M.D.   On: 03/08/2013 16:23   Dg Chest Port 1 View 03/07/2013   CLINICAL DATA:  Chest pain  EXAM: PORTABLE CHEST - 1 VIEW  COMPARISON:  02/14/2013  FINDINGS: Mild cardiac enlargement. Negative for heart failure. Lungs are clear without infiltrate effusion or mass.  IMPRESSION: No active disease.   Electronically Signed   By: Marlan Palau M.D.   On: 03/07/2013 11:33   Current Medications:  . atorvastatin  80 mg Oral q1800  . calcium-vitamin D  1 tablet Oral Daily  . carvedilol  6.25 mg Oral BID WC  . donepezil  10 mg Oral QHS  . glimepiride  2 mg Oral QAC breakfast  . insulin aspart  0-15 Units Subcutaneous TID WC  . lisinopril  5 mg Oral Daily  . mirabegron ER  50 mg Oral Daily  . multivitamin with minerals  1 tablet Oral Daily  . nitroGLYCERIN  0.5 inch Topical Q6H  . Rivaroxaban  15 mg Oral BID WC      ASSESSMENT AND PLAN:  1. CAD: Nonobstructive.  Increased troponin due to PE. Allergic to asa. Would d/c plavix given need for xarelto. Continue statin.    2. PE: This was the cause of elevated troponin.  There is RV strain but patient is hemodynamically stable and no hypoxia.   LE duplex confirms right popliteal DVTXarelto started 10/16.  PE dosing is 15 mg bid x 21 daysand then 20mg  daily  .  Patient  is extremely inactive at home.  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 Have called CM. Consider home PT.  3. AS: moderate.  Monitor in future.  4. HTN: Improved on current regimen   Can go home today with close outpatient f/u with Dr. Juanetta Gosling.   Reuel Boom Tenicia Gural 03/11/2013 11:39 AM

## 2013-03-12 LAB — GLUCOSE, CAPILLARY: Glucose-Capillary: 138 mg/dL — ABNORMAL HIGH (ref 70–99)

## 2013-03-13 ENCOUNTER — Other Ambulatory Visit: Payer: Self-pay

## 2013-03-13 LAB — URINE CULTURE

## 2013-03-13 MED ORDER — ATORVASTATIN CALCIUM 80 MG PO TABS: 80.0000 mg | ORAL_TABLET | Freq: Every day | ORAL | Status: DC

## 2013-03-13 MED ORDER — LISINOPRIL 5 MG PO TABS: 5.0000 mg | ORAL_TABLET | Freq: Every day | ORAL | Status: DC

## 2013-03-13 MED ORDER — CARVEDILOL 6.25 MG PO TABS: 6.2500 mg | ORAL_TABLET | Freq: Two times a day (BID) | ORAL | Status: DC

## 2013-03-13 MED ORDER — NITROGLYCERIN 0.4 MG SL SUBL: 0.4000 mg | SUBLINGUAL_TABLET | SUBLINGUAL | Status: AC | PRN

## 2013-03-19 ENCOUNTER — Encounter: Payer: Medicare Other | Admitting: Adult Health

## 2013-03-19 ENCOUNTER — Encounter: Payer: Self-pay | Admitting: *Deleted

## 2013-03-19 NOTE — Progress Notes (Signed)
HPI: [name] is a 79 year old patient we are following posthospitalization for patient complaints of chest pain and shortness of breath. He will be est. in our office with Dr. Diona Cook. The patient was admitted on 03/07/2013 with complaints of diaphoresis and dyspnea and chest pressure, found to have evidence of VQ scan demonstrating new segmental perfusion deficits consistent with pulmonary embolism. Lower extremity Doppler studies revealed right lower extremity DVT. He was started on Xarelto 15 mg twice a day. He was to continue this for 21 days, and then 20 mg daily indefinitely thereafter.   During the pulsation the patient did undergo cardiac catheterization on 03/08/2013 demonstrating nonobstructive CAD. LVEF of 55-60%. Echocardiogram during that visit also demonstrated an EF of 50-55% with grade 1 diastolic dysfunction. RV size was mildly dilated.  Allergies  Allergen Reactions  . Aspirin Hives and Rash    Current Outpatient Prescriptions  Medication Sig Dispense Refill  . acetaminophen (TYLENOL) 325 MG tablet Take 650 mg by mouth every 6 (six) hours as needed for pain.       . Ascorbic Acid (VITAMIN C) 1000 MG tablet Take 1,000 mg by mouth daily.        Marland Kitchen atorvastatin (LIPITOR) 80 MG tablet Take 1 tablet (80 mg total) by mouth daily at 6 PM.  30 tablet  3  . bumetanide (BUMEX) 2 MG tablet Take 2 mg by mouth daily as needed (Fluid).      . calcium-vitamin D (OSCAL WITH D) 500-200 MG-UNIT per tablet Take 1 tablet by mouth daily.      . carvedilol (COREG) 6.25 MG tablet Take 1 tablet (6.25 mg total) by mouth 2 (two) times daily with a meal.  60 tablet  3  . ciprofloxacin (CIPRO) 500 MG tablet Take 1 tablet (500 mg total) by mouth 2 (two) times daily. For 14 days.  28 tablet  0  . donepezil (ARICEPT) 10 MG tablet Take 10 mg by mouth at bedtime.      Marland Kitchen glimepiride (AMARYL) 2 MG tablet Take 2 mg by mouth daily before breakfast.        . lisinopril (PRINIVIL,ZESTRIL) 5 MG tablet Take 1  tablet (5 mg total) by mouth daily.  30 tablet  3  . mirabegron ER (MYRBETRIQ) 50 MG TB24 tablet Take 50 mg by mouth daily.      . Multiple Vitamin (MULTIVITAMIN WITH MINERALS) TABS tablet Take 1 tablet by mouth daily.      . nitroGLYCERIN (NITROSTAT) 0.4 MG SL tablet Place 1 tablet (0.4 mg total) under the tongue every 5 (five) minutes as needed for chest pain.  25 tablet  3  . Rivaroxaban (XARELTO) 15 MG TABS tablet Take 1 tablet (15 mg total) by mouth 2 (two) times daily with a meal. Start today through 03/30/13.  42 tablet  0  . [START ON 03/31/2013] Rivaroxaban (XARELTO) 20 MG TABS tablet Take 1 tablet (20 mg total) by mouth daily. Start on 03/31/13.  30 tablet  3   No current facility-administered medications for this visit.    Past Medical History  Diagnosis Date  . HTN (hypertension)   . DM (diabetes mellitus)   . Nephrolithiasis   . Carotid artery occlusion     a. 05/2012 U/S Bilat ICA 20-39%.  . Obesity   . Stroke     a. 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 01/2009 TEE: EF 60-65%, mild MR, no LA/LAA thrombus.  . Macular degeneration   . Hiatal hernia  Past Surgical History  Procedure Laterality Date  . Finger surgery Left     pinky, after saw cut it off  . Cataract extraction w/phaco Right 10/02/2012    Procedure: CATARACT EXTRACTION PHACO AND INTRAOCULAR LENS PLACEMENT (IOC);  Surgeon: George Payor, MD;  Location: [facility];  Service: Ophthalmology;  Laterality: Right;  CDE 12.78  . Eye surgery    . Cataract extraction w/phaco Left 10/30/2012    Procedure: CATARACT EXTRACTION PHACO AND INTRAOCULAR LENS PLACEMENT (IOC);  Surgeon: George Payor, MD;  Location: [facility];  Service: Ophthalmology;  Laterality: Left;  CDE: 19.10    ROS: PHYSICAL EXAM There were no vitals taken for this visit.  EKG:  ASSESSMENT AND PLAN

## 2013-03-20 ENCOUNTER — Ambulatory Visit (INDEPENDENT_AMBULATORY_CARE_PROVIDER_SITE_OTHER): Payer: Medicare Other | Admitting: Cardiovascular Disease

## 2013-03-20 ENCOUNTER — Encounter: Payer: Self-pay | Admitting: Cardiovascular Disease

## 2013-03-20 VITALS — BP 141/68 | HR 70 | Ht 70.0 in | Wt 219.0 lb

## 2013-03-20 DIAGNOSIS — I82401 Acute embolism and thrombosis of unspecified deep veins of right lower extremity: Secondary | ICD-10-CM

## 2013-03-20 DIAGNOSIS — I82409 Acute embolism and thrombosis of unspecified deep veins of unspecified lower extremity: Secondary | ICD-10-CM

## 2013-03-20 DIAGNOSIS — I2699 Other pulmonary embolism without acute cor pulmonale: Secondary | ICD-10-CM

## 2013-03-20 DIAGNOSIS — I10 Essential (primary) hypertension: Secondary | ICD-10-CM

## 2013-03-20 DIAGNOSIS — I359 Nonrheumatic aortic valve disorder, unspecified: Secondary | ICD-10-CM

## 2013-03-20 DIAGNOSIS — E785 Hyperlipidemia, unspecified: Secondary | ICD-10-CM

## 2013-03-20 DIAGNOSIS — I2510 Atherosclerotic heart disease of native coronary artery without angina pectoris: Secondary | ICD-10-CM

## 2013-03-20 DIAGNOSIS — I350 Nonrheumatic aortic (valve) stenosis: Secondary | ICD-10-CM

## 2013-03-20 NOTE — Progress Notes (Signed)
Patient ID: George Cook, male   DOB: Mar 17, 1928, 79 y.o.   MRN: [id number]      SUBJECTIVE: [name] is here for post-hospital f/u, after being diagnosed with bilateral PE and right popliteal DVT, for which he now on lifelong Xarelto. He had a troponin elevation and underwent cardiac catheterization with results noted below, and essentially mild to moderate nonobstructive disease. His LV systolic function is normal, and does have moderate aortic stenosis. He also has HTN, CVA, and type II DM. He is allergic to ASA, and is on high-dose Lipitor. He is also being treated for a UTI.  Currently, he denies chest pain and shortness of breath, and is wearing compression stockings. He is here with his wife.   Hospital studies reviewed and detailed below:  CARDIAC CATHETERIZATION - 03/08/13  Hemodynamics:  AO 146/74  LV 146/22  Coronary angiography:  Coronary dominance: right  Left mainstem: 30% distal LM stenosis.  Left anterior descending (LAD): 40% proximal LAD stenosis between D1 and D2. Luminal irregularities mid to distal LAD.  Left circumflex (LCx): 40% proximal LCx stenosis, 40% ostial OM1 stenosis. Right coronary artery (RCA): Luminal irregularities in the RCA.  Left ventriculography: Left ventricular systolic function is normal, LVEF is estimated at 55-60% with no regional wall motion abnormalities in the RAO projection, there is no significant mitral regurgitation   VQ SCAN - 03/08/13  IMPRESSION:  Two segmental perfusion defects consistent with pulmonary embolism.   TRANSTHORACIC ECHOCARDIOGRAM - 03/08/13  - Left ventricle: The cavity size was normal. Wall thickness was normal. Systolic function was normal. The estimated ejection fraction was in the range of 50% to 55%. Wall motion was normal; there were no regional wall motion abnormalities. Doppler parameters are consistent with abnormal left ventricular relaxation (grade 1 diastolic dysfunction). - Aortic valve: Cusp separation was  reduced. Valve mobility was restricted. There was moderate stenosis. Trivial regurgitation. Valve area: 1.35cm^2(VTI). Valve area: 1.4cm^2 (Vmax). - Right ventricle: The cavity size was mildly dilated. Systolic function was mildly reduced. The estimated peak pressure was 50mm Hg.  - Atrial septum: No defect or patent foramen ovale was identified. - Tricuspid valve: Mild-moderate regurgitation. - Pulmonary arteries: PA peak pressure: 50mm Hg (S).   LOWER EXTREMITY DOPPLERS - 03/09/13  - Findings consistent with deep vein thrombosis involving the right popliteal vein and right gastrocnemius vein.  - Findings consistent with superficial vein thrombosis involving the right small saphenous vein.  - No evidence of deep vein thrombosis involving the left lower extremity.   Allergies  Allergen Reactions  . Aspirin Hives and Rash    Current Outpatient Prescriptions  Medication Sig Dispense Refill  . acetaminophen (TYLENOL) 325 MG tablet Take 650 mg by mouth every 6 (six) hours as needed for pain.       . Ascorbic Acid (VITAMIN C) 1000 MG tablet Take 1,000 mg by mouth daily.        Marland Kitchen atorvastatin (LIPITOR) 80 MG tablet Take 1 tablet (80 mg total) by mouth daily at 6 PM.  30 tablet  3  . bumetanide (BUMEX) 2 MG tablet Take 2 mg by mouth daily as needed (Fluid).      . calcium-vitamin D (OSCAL WITH D) 500-200 MG-UNIT per tablet Take 1 tablet by mouth daily.      . carvedilol (COREG) 6.25 MG tablet Take 1 tablet (6.25 mg total) by mouth 2 (two) times daily with a meal.  60 tablet  3  . donepezil (ARICEPT) 10 MG tablet Take 10  mg by mouth at bedtime.      Marland Kitchen glimepiride (AMARYL) 2 MG tablet Take 2 mg by mouth daily before breakfast.        . lisinopril (PRINIVIL,ZESTRIL) 5 MG tablet Take 1 tablet (5 mg total) by mouth daily.  30 tablet  3  . mirabegron ER (MYRBETRIQ) 50 MG TB24 tablet Take 50 mg by mouth daily.      . Multiple Vitamin (MULTIVITAMIN WITH MINERALS) TABS tablet Take 1 tablet by mouth  daily.      . nitroGLYCERIN (NITROSTAT) 0.4 MG SL tablet Place 1 tablet (0.4 mg total) under the tongue every 5 (five) minutes as needed for chest pain.  25 tablet  3  . Rivaroxaban (XARELTO) 15 MG TABS tablet Take 1 tablet (15 mg total) by mouth 2 (two) times daily with a meal. Start today through 03/30/13.  42 tablet  0  . ciprofloxacin (CIPRO) 500 MG tablet Take 1 tablet (500 mg total) by mouth 2 (two) times daily. For 14 days.  28 tablet  0   No current facility-administered medications for this visit.    Past Medical History  Diagnosis Date  . HTN (hypertension)   . DM (diabetes mellitus)   . Nephrolithiasis   . Carotid artery occlusion     a. 05/2012 U/S Bilat ICA 20-39%.  . Obesity   . Stroke     a. 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 01/2009 TEE: EF 60-65%, mild MR, no LA/LAA thrombus.  . Macular degeneration   . Hiatal hernia     Past Surgical History  Procedure Laterality Date  . Finger surgery Left     pinky, after saw cut it off  . Cataract extraction w/phaco Right 10/02/2012    Procedure: CATARACT EXTRACTION PHACO AND INTRAOCULAR LENS PLACEMENT (IOC);  Surgeon: Gemma Payor, MD;  Location: [facility];  Service: Ophthalmology;  Laterality: Right;  CDE 12.78  . Eye surgery    . Cataract extraction w/phaco Left 10/30/2012    Procedure: CATARACT EXTRACTION PHACO AND INTRAOCULAR LENS PLACEMENT (IOC);  Surgeon: Gemma Payor, MD;  Location: [facility];  Service: Ophthalmology;  Laterality: Left;  CDE: 19.10    History   Social History  . Marital Status: Married    Spouse Name: N/A    Number of Children: N/A  . Years of Education: N/A   Occupational History  . Not on file.   Social History Main Topics  . Smoking status: Former Smoker -- 3.00 packs/day for 40 years    Types: Cigarettes    Quit date: 05/25/1991  . Smokeless tobacco: Never Used  . Alcohol Use: No  . Drug Use: No  . Sexual Activity: Not on file   Other Topics Concern  . Not on file   Social History  Narrative   Lives in [place] with wife.  Sedentary.  Does not exercise.  Spends most of the day in his recliner.     Filed Vitals:   03/20/13 1355  BP: 141/68  Pulse: 70  Height: 5\' 10"  (1.778 m)  Weight: 219 lb (99.338 kg)    PHYSICAL EXAM General: NAD Neck: No JVD, no thyromegaly or thyroid nodule.  Lungs: Clear to auscultation bilaterally with normal respiratory effort. CV: Nondisplaced PMI.  Heart regular S1/S2, no S3/S4, III/VI crescendo-decrescendo systolic murmur at RUSB.  Trace peripheral edema with compression stockings.  No carotid bruit.   Abdomen: Soft, nontender, no hepatosplenomegaly, no distention.  Neurologic: Alert and oriented x 3.  Psych: Normal  affect. Extremities: No clubbing or cyanosis.   ECG: reviewed and available in electronic records.    ASSESSMENT AND PLAN: 1. Bilateral PE and right popliteal DVT: continue Xarelto lifelong. 2. Mild to moderate CAD: symptomatically stable. Allergic to ASA. Continue high-dose Lipitor, along with Coreg and lisinopril. 3. HTN: relatively stable on current therapy. 4. Hyperlipidemia: recheck LFT's and lipid panel in near future. 5. Aortic stenosis: moderate at present, f/u with echocardiogram in one year.   Prentice Docker, M.D., F.A.C.C.

## 2013-03-20 NOTE — Patient Instructions (Addendum)
Your physician recommends that you schedule a follow-up appointment in 3 MONTHS. 

## 2013-04-02 ENCOUNTER — Telehealth: Payer: Self-pay | Admitting: *Deleted

## 2013-04-02 MED ORDER — RIVAROXABAN 20 MG PO TABS: 20.0000 mg | ORAL_TABLET | Freq: Every day | ORAL | Status: DC

## 2013-04-02 NOTE — Telephone Encounter (Signed)
Message copied by Carmela Hurt on Mon Apr 02, 2013 10:46 AM ------      Message from: Prentice Docker A      Created: Thu Mar 29, 2013 11:39 PM      Regarding: RE: xarelto dose       Yes. Thank you.            ----- Message -----         From: Carmela Hurt, RN         Sent: 03/27/2013   7:51 AM           To: Laqueta Linden, MD      Subject: xarelto dose                                             He is presently on xarelto 15 mg bid initially for dx, I have received a refill request, does his dose need to change to 20 mg after 03/30/2013            Thanks, Addison Lank, RN Patient Care Advocate       ------

## 2013-04-10 ENCOUNTER — Other Ambulatory Visit: Payer: Self-pay | Admitting: Neurosurgery

## 2013-04-10 DIAGNOSIS — Z48812 Encounter for surgical aftercare following surgery on the circulatory system: Secondary | ICD-10-CM

## 2013-05-01 ENCOUNTER — Encounter (INDEPENDENT_AMBULATORY_CARE_PROVIDER_SITE_OTHER): Payer: Self-pay

## 2013-05-01 ENCOUNTER — Ambulatory Visit (INDEPENDENT_AMBULATORY_CARE_PROVIDER_SITE_OTHER): Payer: Medicare Other | Admitting: Urology

## 2013-05-01 DIAGNOSIS — R351 Nocturia: Secondary | ICD-10-CM

## 2013-05-01 DIAGNOSIS — R350 Frequency of micturition: Secondary | ICD-10-CM

## 2013-05-01 DIAGNOSIS — R3915 Urgency of urination: Secondary | ICD-10-CM

## 2013-06-11 ENCOUNTER — Encounter: Payer: Self-pay | Admitting: Family

## 2013-06-12 ENCOUNTER — Encounter: Payer: Self-pay | Admitting: Family

## 2013-06-12 ENCOUNTER — Ambulatory Visit (HOSPITAL_COMMUNITY)
Admission: RE | Admit: 2013-06-12 | Discharge: 2013-06-12 | Disposition: A | Discharge status: Home / Self Care | Payer: Medicare Other | Source: Ambulatory Visit | Attending: Family | Admitting: Family

## 2013-06-12 ENCOUNTER — Ambulatory Visit (INDEPENDENT_AMBULATORY_CARE_PROVIDER_SITE_OTHER): Payer: Medicare Other | Admitting: Family

## 2013-06-12 VITALS — BP 155/86 | HR 73 | Resp 20 | Ht 70.5 in | Wt 220.0 lb

## 2013-06-12 DIAGNOSIS — Z48812 Encounter for surgical aftercare following surgery on the circulatory system: Secondary | ICD-10-CM

## 2013-06-12 DIAGNOSIS — I6529 Occlusion and stenosis of unspecified carotid artery: Secondary | ICD-10-CM

## 2013-06-12 DIAGNOSIS — I658 Occlusion and stenosis of other precerebral arteries: Secondary | ICD-10-CM | POA: Insufficient documentation

## 2013-06-12 NOTE — Patient Instructions (Signed)

## 2013-06-12 NOTE — Progress Notes (Signed)
Established Carotid Patient   History of Present Illness  George Cook is a 80 y.o. male  patient of Dr. Hart RochesterLawson followed for known carotid stenosis.  Patient has not had previous carotid artery intervention. Had a stroke 9-10 years ago, he does not recall his symptoms, denies any residual neurological deficit. Pt denies steal symptoms in either arm.   Pt reports New Medical or Surgical History: hospitalized at [facility] last October for PE  Pt Diabetic: Yes, states uncontrolled lately, since the PE Pt smoker: former smoker, quit in 1993  Pt meds include: Statin : Yes Betablocker: Yes ASA: No Other anticoagulants/antiplatelets: Xaralto for PE, Plavix was stopped   Past Medical History  Diagnosis Date  . HTN (hypertension)   . DM (diabetes mellitus)   . Nephrolithiasis   . Carotid artery occlusion     a. 05/2012 U/S Bilat ICA 20-39%.  . Obesity   . Stroke     a. 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 01/2009 TEE: EF 60-65%, mild MR, no LA/LAA thrombus.  . Macular degeneration   . Hiatal hernia     Social History History  Substance Use Topics  . Smoking status: Former Smoker -- 3.00 packs/day for 40 years    Types: Cigarettes    Quit date: 05/25/1991  . Smokeless tobacco: Never Used  . Alcohol Use: No    Family History Family History  Problem Relation Age of Onset  . Diabetes Mother   . Stroke Mother   . Coronary artery disease Father   . Stroke Sister   . Diabetes Sister   . Coronary artery disease Brother     Surgical History Past Surgical History  Procedure Laterality Date  . Finger surgery Left     pinky, after saw cut it off  . Cataract extraction w/phaco Right 10/02/2012    Procedure: CATARACT EXTRACTION PHACO AND INTRAOCULAR LENS PLACEMENT (IOC);  Surgeon: Gemma PayorKerry Hunt, MD;  Location: [facility];  Service: Ophthalmology;  Laterality: Right;  CDE 12.78  . Eye surgery    . Cataract extraction w/phaco Left 10/30/2012    Procedure: CATARACT EXTRACTION PHACO AND  INTRAOCULAR LENS PLACEMENT (IOC);  Surgeon: Gemma PayorKerry Hunt, MD;  Location: [facility];  Service: Ophthalmology;  Laterality: Left;  CDE: 19.10    Allergies  Allergen Reactions  . Aspirin Hives and Rash    Current Outpatient Prescriptions  Medication Sig Dispense Refill  . acetaminophen (TYLENOL) 325 MG tablet Take 650 mg by mouth every 6 (six) hours as needed for pain.       . Ascorbic Acid (VITAMIN C) 1000 MG tablet Take 1,000 mg by mouth daily.        Marland Kitchen. atorvastatin (LIPITOR) 80 MG tablet Take 1 tablet (80 mg total) by mouth daily at 6 PM.  30 tablet  3  . bumetanide (BUMEX) 2 MG tablet Take 2 mg by mouth daily as needed (Fluid).      . calcium-vitamin D (OSCAL WITH D) 500-200 MG-UNIT per tablet Take 1 tablet by mouth daily.      . carvedilol (COREG) 6.25 MG tablet Take 1 tablet (6.25 mg total) by mouth 2 (two) times daily with a meal.  60 tablet  3  . donepezil (ARICEPT) 10 MG tablet Take 10 mg by mouth at bedtime.      Marland Kitchen. glimepiride (AMARYL) 2 MG tablet Take 2 mg by mouth daily before breakfast.        . lisinopril (PRINIVIL,ZESTRIL) 5 MG tablet Take 1 tablet (5  mg total) by mouth daily.  30 tablet  3  . Multiple Vitamin (MULTIVITAMIN WITH MINERALS) TABS tablet Take 1 tablet by mouth daily.      . nitroGLYCERIN (NITROSTAT) 0.4 MG SL tablet Place 1 tablet (0.4 mg total) under the tongue every 5 (five) minutes as needed for chest pain.  25 tablet  3  . Rivaroxaban (XARELTO) 20 MG TABS tablet Take 1 tablet (20 mg total) by mouth daily with supper.  30 tablet  6  . ciprofloxacin (CIPRO) 500 MG tablet Take 1 tablet (500 mg total) by mouth 2 (two) times daily. For 14 days.  28 tablet  0  . mirabegron ER (MYRBETRIQ) 50 MG TB24 tablet Take 50 mg by mouth daily.       No current facility-administered medications for this visit.    Review of Systems : See HPI for pertinent positives and negatives.  Physical Examination  Filed Vitals:   06/12/13 1216  BP: 155/86  Pulse: 73  Resp:    Filed  Weights   06/12/13 1214  Weight: 220 lb (99.791 kg)   Body mass index is 31.11 kg/(m^2).   General: WDWN obese male in NAD GAIT: using cane, slow and deliberate Eyes: PERRLA Pulmonary:  CTAB, Negative  Rales, Negative rhonchi, & transient wheezing in right posterior fields.  Cardiac: regular Rhythm ,  Positive Murmur.  VASCULAR EXAM Carotid Bruits Left Right   transmitted cardiac murmur Transmitted cardiac murmur     Radial pulses: right is not palpable, left is 2+, right brachial pulse is 2+.                                                                                                                            LE Pulses LEFT RIGHT       POPLITEAL  not palpable   not palpable       POSTERIOR TIBIAL  not palpable   not palpable        DORSALIS PEDIS      ANTERIOR TIBIAL  palpable   palpable     Gastrointestinal: soft, nontender, BS WNL, no r/g,  negative masses.  Musculoskeletal: Negative muscle atrophy/wasting. M/S 5/5 in UE's, 4/5 in LE's, Extremities without ischemic changes.  Neurologic: A&O X 3; Appropriate Affect ; SENSATION ;normal;  Speech is loud CN 2-12 intact  Except is hard of hearing, Pain and light touch intact in extremities, Motor exam as listed above.   Non-Invasive Vascular Imaging CAROTID DUPLEX 06/12/2013   1. Evidence of < 40% stenosis of the right internal carotid artery. 2. Evidence of 40%-59% stenosis of the left internal carotid artery. 3. Bilateral vertebral artery is antegrade.    Slight increase in left internal carotid artery disease     Assessment: [name] is a 80 y.o. male who presents with remote history os stroke, minimal right ICA stenosis, mild/moderate left ICA stenosis. The left  ICA stenosis is slightly Worse from previous exam.  Plan:  Follow-up in 1 year with Carotid Duplex scan.   I discussed in depth with the patient the nature of atherosclerosis, and emphasized the importance of maximal medical management  including strict control of blood pressure, blood glucose, and lipid levels, obtaining regular exercise, and continued cessation of smoking.  The patient is aware that without maximal medical management the underlying atherosclerotic disease process will progress, limiting the benefit of any interventions. The patient was given information about stroke prevention and what symptoms should prompt the patient to seek immediate medical care. Thank you for allowing [place] to participate in this patient's care.  Charisse March, RN, MSN, FNP-C [facility] Office: [phone]  Clinic Physician: Hart Rochester  06/12/2013 12:42 PM

## 2013-06-21 ENCOUNTER — Encounter: Payer: Self-pay | Admitting: Cardiovascular Disease

## 2013-06-21 ENCOUNTER — Ambulatory Visit (INDEPENDENT_AMBULATORY_CARE_PROVIDER_SITE_OTHER): Payer: Medicare Other | Admitting: Cardiovascular Disease

## 2013-06-21 VITALS — BP 154/66 | HR 69 | Ht 70.0 in | Wt 199.0 lb

## 2013-06-21 DIAGNOSIS — E785 Hyperlipidemia, unspecified: Secondary | ICD-10-CM

## 2013-06-21 DIAGNOSIS — I350 Nonrheumatic aortic (valve) stenosis: Secondary | ICD-10-CM

## 2013-06-21 DIAGNOSIS — I359 Nonrheumatic aortic valve disorder, unspecified: Secondary | ICD-10-CM

## 2013-06-21 DIAGNOSIS — I2699 Other pulmonary embolism without acute cor pulmonale: Secondary | ICD-10-CM

## 2013-06-21 DIAGNOSIS — I82409 Acute embolism and thrombosis of unspecified deep veins of unspecified lower extremity: Secondary | ICD-10-CM

## 2013-06-21 DIAGNOSIS — I2510 Atherosclerotic heart disease of native coronary artery without angina pectoris: Secondary | ICD-10-CM

## 2013-06-21 DIAGNOSIS — I82401 Acute embolism and thrombosis of unspecified deep veins of right lower extremity: Secondary | ICD-10-CM

## 2013-06-21 DIAGNOSIS — I10 Essential (primary) hypertension: Secondary | ICD-10-CM

## 2013-06-21 MED ORDER — CARVEDILOL 3.125 MG PO TABS: 3.1250 mg | ORAL_TABLET | Freq: Two times a day (BID) | ORAL | Status: DC

## 2013-06-21 NOTE — Patient Instructions (Signed)
Your physician wants you to follow-up in: 6 months You will receive a reminder letter in the mail two months in advance. If you don't receive a letter, please call our office to schedule the follow-up appointment.  Your physician has recommended you make the following change in your medication:   START Coreg 3.125 mg twice a day

## 2013-06-21 NOTE — Progress Notes (Signed)
Patient ID: George Cook, male   DOB: 1928-01-21, 80 y.o.   MRN: [id number]      SUBJECTIVE: George Cook has a h/o bilateral PE and right popliteal DVT, for which he takes Xarelto. He also has mild to moderate nonobstructive coronary artery disease. He has normal LV systolic function and moderate aortic stenosis. He also has HTN, CVA, and type II DM. He is allergic to ASA, and is on high-dose Lipitor.  He is wearing compression stockings. He is here with his wife.  He very seldom gets chest pain. He may get short of breath when climbing a flight of stairs. He occasionally has leg swelling for which he takes when necessary Bumex. He is uncertain as to whether or not he is taking Coreg.     Allergies  Allergen Reactions  . Aspirin Hives and Rash    Current Outpatient Prescriptions  Medication Sig Dispense Refill  . Ascorbic Acid (VITAMIN C) 1000 MG tablet Take 1,000 mg by mouth daily.        Marland Kitchen. atorvastatin (LIPITOR) 80 MG tablet Take 1 tablet (80 mg total) by mouth daily at 6 PM.  30 tablet  3  . azithromycin (ZITHROMAX) 250 MG tablet       . bumetanide (BUMEX) 2 MG tablet Take 2 mg by mouth daily as needed (Fluid).      . calcium-vitamin D (OSCAL WITH D) 500-200 MG-UNIT per tablet Take 1 tablet by mouth daily.      . carvedilol (COREG) 6.25 MG tablet Take 1 tablet (6.25 mg total) by mouth 2 (two) times daily with a meal.  60 tablet  3  . ciprofloxacin (CIPRO) 500 MG tablet Take 1 tablet (500 mg total) by mouth 2 (two) times daily. For 14 days.  28 tablet  0  . donepezil (ARICEPT) 10 MG tablet Take 10 mg by mouth at bedtime.      Marland Kitchen. glimepiride (AMARYL) 2 MG tablet Take 2 mg by mouth daily before breakfast.        . lisinopril (PRINIVIL,ZESTRIL) 5 MG tablet Take 1 tablet (5 mg total) by mouth daily.  30 tablet  3  . metFORMIN (GLUCOPHAGE) 500 MG tablet       . mirabegron ER (MYRBETRIQ) 50 MG TB24 tablet Take 50 mg by mouth daily.      . Multiple Vitamin (MULTIVITAMIN WITH MINERALS) TABS  tablet Take 1 tablet by mouth daily.      . nitroGLYCERIN (NITROSTAT) 0.4 MG SL tablet Place 1 tablet (0.4 mg total) under the tongue every 5 (five) minutes as needed for chest pain.  25 tablet  3  . Rivaroxaban (XARELTO) 20 MG TABS tablet Take 1 tablet (20 mg total) by mouth daily with supper.  30 tablet  6   No current facility-administered medications for this visit.    Past Medical History  Diagnosis Date  . HTN (hypertension)   . DM (diabetes mellitus)   . Nephrolithiasis   . Carotid artery occlusion     a. 05/2012 U/S Bilat ICA 20-39%.  . Obesity   . Stroke     a. 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 01/2009 TEE: EF 60-65%, mild MR, no LA/LAA thrombus.  . Macular degeneration   . Hiatal hernia     Past Surgical History  Procedure Laterality Date  . Finger surgery Left     pinky, after saw cut it off  . Cataract extraction w/phaco Right 10/02/2012    Procedure: CATARACT EXTRACTION PHACO  AND INTRAOCULAR LENS PLACEMENT (IOC);  Surgeon: Gemma Payor, MD;  Location: [facility];  Service: Ophthalmology;  Laterality: Right;  CDE 12.78  . Eye surgery    . Cataract extraction w/phaco Left 10/30/2012    Procedure: CATARACT EXTRACTION PHACO AND INTRAOCULAR LENS PLACEMENT (IOC);  Surgeon: Gemma Payor, MD;  Location: [facility];  Service: Ophthalmology;  Laterality: Left;  CDE: 19.10    History   Social History  . Marital Status: Married    Spouse Name: N/A    Number of Children: N/A  . Years of Education: N/A   Occupational History  . Not on file.   Social History Main Topics  . Smoking status: Former Smoker -- 3.00 packs/day for 40 years    Types: Cigarettes    Quit date: 05/25/1991  . Smokeless tobacco: Never Used  . Alcohol Use: No  . Drug Use: No  . Sexual Activity: Not on file   Other Topics Concern  . Not on file   Social History Narrative   Lives in [place] with wife.  Sedentary.  Does not exercise.  Spends most of the day in his recliner.     Filed Vitals:    06/21/13 1103  BP: 154/66  Pulse: 69  Height: 5\' 10"  (1.778 m)  Weight: 199 lb (90.266 kg)    PHYSICAL EXAM General: NAD  Neck: No JVD, no thyromegaly or thyroid nodule.  Lungs: Clear to auscultation bilaterally with normal respiratory effort.  CV: Nondisplaced PMI. Heart regular S1/S2, no S3/S4, III/VI crescendo-decrescendo systolic murmur at RUSB. Trace peripheral edema with compression stockings. No carotid bruit.  Abdomen: Soft, nontender, no hepatosplenomegaly, no distention.  Neurologic: Alert and oriented x 3.  Psych: Normal affect.  Extremities: No clubbing or cyanosis.    ECG: reviewed and available in electronic records.      ASSESSMENT AND PLAN: 1. Bilateral PE and right popliteal DVT: continue Xarelto lifelong.  2. Mild to moderate CAD: symptomatically stable. Allergic to ASA. Continue high-dose Lipitor, along with Coreg and lisinopril.  3. HTN: uncontrolled today. Will reinitiate Coreg at 3.125 mg bid. 4. Hyperlipidemia: he reportedly recently had a lipid panel. I will try and obtain these results. 5. Aortic stenosis: moderate at present, f/u with echocardiogram in 9 months.  Dispo: f/u 6 months.  Prentice Docker, M.D., F.A.C.C.

## 2013-09-28 ENCOUNTER — Telehealth: Payer: Self-pay | Admitting: *Deleted

## 2013-09-28 MED ORDER — CARVEDILOL 3.125 MG PO TABS: 3.1250 mg | ORAL_TABLET | Freq: Two times a day (BID) | ORAL | Status: DC

## 2013-09-28 NOTE — Telephone Encounter (Signed)
Medication sent via escribe.  

## 2013-09-28 NOTE — Telephone Encounter (Signed)
Carvedilol 3.12 mg #180

## 2013-11-13 ENCOUNTER — Telehealth: Payer: Self-pay | Admitting: Cardiovascular Disease

## 2013-11-13 MED ORDER — RIVAROXABAN 20 MG PO TABS: 20.0000 mg | ORAL_TABLET | Freq: Every day | ORAL | Status: DC

## 2013-11-13 NOTE — Telephone Encounter (Signed)
Refill request complete 

## 2013-11-13 NOTE — Telephone Encounter (Signed)
Received fax refill request  Rx # [id number] Medication:  Xarelto 20 mg tablet Qty 30 Sig:  Take one tablet by mouth once daily Physician:  Purvis SheffieldKoneswaran

## 2013-12-04 ENCOUNTER — Other Ambulatory Visit (HOSPITAL_COMMUNITY): Payer: Self-pay | Admitting: Pulmonary Disease

## 2013-12-04 ENCOUNTER — Ambulatory Visit (HOSPITAL_COMMUNITY)
Admission: RE | Admit: 2013-12-04 | Discharge: 2013-12-04 | Disposition: A | Discharge status: Home / Self Care | Payer: Medicare Other | Source: Ambulatory Visit | Attending: Pulmonary Disease | Admitting: Pulmonary Disease

## 2013-12-04 DIAGNOSIS — R0602 Shortness of breath: Secondary | ICD-10-CM | POA: Insufficient documentation

## 2013-12-04 DIAGNOSIS — R030 Elevated blood-pressure reading, without diagnosis of hypertension: Secondary | ICD-10-CM | POA: Insufficient documentation

## 2013-12-06 ENCOUNTER — Encounter: Payer: Self-pay | Admitting: Cardiovascular Disease

## 2013-12-24 ENCOUNTER — Ambulatory Visit: Payer: Medicare Other | Admitting: Cardiovascular Disease

## 2013-12-31 ENCOUNTER — Encounter: Payer: Self-pay | Admitting: Cardiovascular Disease

## 2013-12-31 ENCOUNTER — Ambulatory Visit (INDEPENDENT_AMBULATORY_CARE_PROVIDER_SITE_OTHER): Payer: Medicare Other | Admitting: Cardiovascular Disease

## 2013-12-31 VITALS — BP 130/80 | HR 62 | Ht 70.0 in | Wt 216.0 lb

## 2013-12-31 DIAGNOSIS — I359 Nonrheumatic aortic valve disorder, unspecified: Secondary | ICD-10-CM

## 2013-12-31 DIAGNOSIS — I2510 Atherosclerotic heart disease of native coronary artery without angina pectoris: Secondary | ICD-10-CM

## 2013-12-31 DIAGNOSIS — I350 Nonrheumatic aortic (valve) stenosis: Secondary | ICD-10-CM

## 2013-12-31 DIAGNOSIS — R42 Dizziness and giddiness: Secondary | ICD-10-CM

## 2013-12-31 DIAGNOSIS — I2699 Other pulmonary embolism without acute cor pulmonale: Secondary | ICD-10-CM

## 2013-12-31 DIAGNOSIS — E785 Hyperlipidemia, unspecified: Secondary | ICD-10-CM

## 2013-12-31 DIAGNOSIS — I10 Essential (primary) hypertension: Secondary | ICD-10-CM

## 2013-12-31 DIAGNOSIS — I82409 Acute embolism and thrombosis of unspecified deep veins of unspecified lower extremity: Secondary | ICD-10-CM

## 2013-12-31 DIAGNOSIS — I82401 Acute embolism and thrombosis of unspecified deep veins of right lower extremity: Secondary | ICD-10-CM

## 2013-12-31 NOTE — Progress Notes (Signed)
Patient ID: George Cook, male   DOB: [birth year], 80 y.o.   MRN: [id number]      SUBJECTIVE: George Cook has a h/o bilateral PE and right popliteal DVT, for which he takes Xarelto. He also has mild to moderate nonobstructive coronary artery disease. He has normal LV systolic function and moderate aortic stenosis. He also has HTN, CVA, and type II DM. He is allergic to ASA, and is on high-dose Lipitor.  He gets lightheaded if he stands up too quickly but denies dizziness and lightheadedness with exertion. He denies exertional chest discomfort. He has baseline exertional dyspnea which is the same since his last visit and has not gotten any worse. He said his leg swelling is under good control with his compression stockings and Bumex as needed.  Review of Systems: As per "subjective", otherwise negative.  Allergies  Allergen Reactions  . Aspirin Hives and Rash    Current Outpatient Prescriptions  Medication Sig Dispense Refill  . Ascorbic Acid (VITAMIN C) 1000 MG tablet Take 1,000 mg by mouth daily.        Marland Kitchen. atorvastatin (LIPITOR) 80 MG tablet Take 1 tablet (80 mg total) by mouth daily at 6 PM.  30 tablet  3  . azithromycin (ZITHROMAX) 250 MG tablet       . bumetanide (BUMEX) 2 MG tablet Take 2 mg by mouth daily as needed (Fluid).      . calcium-vitamin D (OSCAL WITH D) 500-200 MG-UNIT per tablet Take 1 tablet by mouth daily.      . carvedilol (COREG) 3.125 MG tablet Take 1 tablet (3.125 mg total) by mouth 2 (two) times daily with a meal.  180 tablet  3  . donepezil (ARICEPT) 10 MG tablet Take 10 mg by mouth at bedtime.      Marland Kitchen. glimepiride (AMARYL) 2 MG tablet Take 2 mg by mouth daily before breakfast.        . lisinopril (PRINIVIL,ZESTRIL) 5 MG tablet Take 1 tablet (5 mg total) by mouth daily.  30 tablet  3  . metFORMIN (GLUCOPHAGE) 500 MG tablet       . mirabegron ER (MYRBETRIQ) 50 MG TB24 tablet Take 50 mg by mouth daily.      . Multiple Vitamin (MULTIVITAMIN WITH MINERALS) TABS tablet Take  1 tablet by mouth daily.      . nitroGLYCERIN (NITROSTAT) 0.4 MG SL tablet Place 1 tablet (0.4 mg total) under the tongue every 5 (five) minutes as needed for chest pain.  25 tablet  3  . rivaroxaban (XARELTO) 20 MG TABS tablet Take 1 tablet (20 mg total) by mouth daily with supper.  30 tablet  11   No current facility-administered medications for this visit.    Past Medical History  Diagnosis Date  . HTN (hypertension)   . DM (diabetes mellitus)   . Nephrolithiasis   . Carotid artery occlusion     a. 05/2012 U/S Bilat ICA 20-39%.  . Obesity   . Stroke     a. 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 01/2009 TEE: EF 60-65%, mild MR, no LA/LAA thrombus.  . Macular degeneration   . Hiatal hernia     Past Surgical History  Procedure Laterality Date  . Finger surgery Left     pinky, after saw cut it off  . Cataract extraction w/phaco Right 10/02/2012    Procedure: CATARACT EXTRACTION PHACO AND INTRAOCULAR LENS PLACEMENT (IOC);  Surgeon: Gemma PayorKerry Hunt, MD;  Location: [facility];  Service: Ophthalmology;  Laterality: Right;  CDE 12.78  . Eye surgery    . Cataract extraction w/phaco Left 10/30/2012    Procedure: CATARACT EXTRACTION PHACO AND INTRAOCULAR LENS PLACEMENT (IOC);  Surgeon: Gemma Payor, MD;  Location: [facility];  Service: Ophthalmology;  Laterality: Left;  CDE: 19.10    History   Social History  . Marital Status: Married    Spouse Name: N/A    Number of Children: N/A  . Years of Education: N/A   Occupational History  . Not on file.   Social History Main Topics  . Smoking status: Former Smoker -- 3.00 packs/day for 40 years    Types: Cigarettes    Quit date: 05/25/1991  . Smokeless tobacco: Never Used  . Alcohol Use: No  . Drug Use: No  . Sexual Activity: Not on file   Other Topics Concern  . Not on file   Social History Narrative   Lives in [place] with wife.  Sedentary.  Does not exercise.  Spends most of the day in his recliner.     Filed Vitals:   12/31/13 1017    BP: 130/80  Pulse: 62  Height: 5\' 10"  (1.778 m)  Weight: 216 lb (97.977 kg)    PHYSICAL EXAM General: NAD  Neck: No JVD, no thyromegaly or thyroid nodule.  Lungs: Clear to auscultation bilaterally with normal respiratory effort.  CV: Nondisplaced PMI. Heart regular S1/S2, no S3/S4, III/VI crescendo-decrescendo systolic murmur at RUSB. Trace peripheral edema with compression stockings. No carotid bruit.  Abdomen: Soft, nontender, no hepatosplenomegaly, no distention.  Neurologic: Alert and oriented x 3.  Psych: Normal affect.  Extremities: No clubbing or cyanosis.   ECG: reviewed and available in electronic records.      ASSESSMENT AND PLAN: 1. Bilateral PE and right popliteal DVT: Continue Xarelto lifelong. As he is in the "dougnut hole", I will see if the company can provide him with assistance, as it is now costing $140 per month. 2. Mild to moderate CAD: Symptomatically stable. Allergic to ASA. Continue high-dose Lipitor, along with Coreg and lisinopril.  3. HTN: Controlled on present therapy. 4. Hyperlipidemia: Continue Lipitor.  5. Aortic stenosis: Moderate at present. Will monitor clinically and surveillance echocardiography. 6. Dizziness: I instructed him to stand up slowly so as to avoid transient hypotension leading to dizziness, which appears to be orthostatic in nature.  Dispo: f/u 6 months.   Prentice Docker, M.D., F.A.C.C.

## 2013-12-31 NOTE — Patient Instructions (Signed)
Your physician recommends that you schedule a follow-up appointment in: 6 months with Dr Koneswaran You will receive a reminder letter two months in advance reminding you to call and schedule your appointment. If you don't receive this letter, please contact our office.  Your physician recommends that you continue on your current medications as directed. Please refer to the Current Medication list given to you today.   

## 2014-01-22 ENCOUNTER — Telehealth: Payer: Self-pay

## 2014-01-22 NOTE — Telephone Encounter (Signed)
Dispensed Xarelto 20 mg -two boxes to pt (to wife as she had an apt today)

## 2014-01-22 NOTE — Telephone Encounter (Signed)
I mailed pt Patient Assistance form to have him complete his portion and return to office to see if he can qualify for discounted drug rate as he is in the "doughnut hole" for pharmacy

## 2014-01-30 ENCOUNTER — Telehealth: Payer: Self-pay

## 2014-01-30 NOTE — Telephone Encounter (Signed)
Faxed to [employer] pt assistance form for [employer]

## 2014-02-22 ENCOUNTER — Encounter (HOSPITAL_COMMUNITY): Payer: Self-pay | Admitting: Emergency Medicine

## 2014-02-22 ENCOUNTER — Emergency Department (HOSPITAL_COMMUNITY): Payer: Medicare Other

## 2014-02-22 ENCOUNTER — Emergency Department (HOSPITAL_COMMUNITY)
Admission: EM | Admit: 2014-02-22 | Discharge: 2014-02-22 | Disposition: A | Discharge status: Home / Self Care | Payer: Medicare Other | Attending: Emergency Medicine | Admitting: Emergency Medicine

## 2014-02-22 DIAGNOSIS — Z79899 Other long term (current) drug therapy: Secondary | ICD-10-CM | POA: Diagnosis not present

## 2014-02-22 DIAGNOSIS — Z87891 Personal history of nicotine dependence: Secondary | ICD-10-CM | POA: Insufficient documentation

## 2014-02-22 DIAGNOSIS — E119 Type 2 diabetes mellitus without complications: Secondary | ICD-10-CM | POA: Diagnosis not present

## 2014-02-22 DIAGNOSIS — Z8673 Personal history of transient ischemic attack (TIA), and cerebral infarction without residual deficits: Secondary | ICD-10-CM | POA: Insufficient documentation

## 2014-02-22 DIAGNOSIS — Z8669 Personal history of other diseases of the nervous system and sense organs: Secondary | ICD-10-CM | POA: Diagnosis not present

## 2014-02-22 DIAGNOSIS — I10 Essential (primary) hypertension: Secondary | ICD-10-CM | POA: Diagnosis not present

## 2014-02-22 DIAGNOSIS — Z8719 Personal history of other diseases of the digestive system: Secondary | ICD-10-CM | POA: Diagnosis not present

## 2014-02-22 DIAGNOSIS — Z87442 Personal history of urinary calculi: Secondary | ICD-10-CM | POA: Insufficient documentation

## 2014-02-22 DIAGNOSIS — R4182 Altered mental status, unspecified: Secondary | ICD-10-CM | POA: Diagnosis not present

## 2014-02-22 DIAGNOSIS — Z7901 Long term (current) use of anticoagulants: Secondary | ICD-10-CM | POA: Insufficient documentation

## 2014-02-22 DIAGNOSIS — R404 Transient alteration of awareness: Secondary | ICD-10-CM

## 2014-02-22 LAB — CBC WITH DIFFERENTIAL/PLATELET
BASOS PCT: 0 % (ref 0–1)
Basophils Absolute: 0 10*3/uL (ref 0.0–0.1)
EOS ABS: 0.3 10*3/uL (ref 0.0–0.7)
EOS PCT: 5 % (ref 0–5)
HCT: 35.5 % — ABNORMAL LOW (ref 39.0–52.0)
Hemoglobin: 11.7 g/dL — ABNORMAL LOW (ref 13.0–17.0)
LYMPHS ABS: 1.7 10*3/uL (ref 0.7–4.0)
Lymphocytes Relative: 23 % (ref 12–46)
MCH: 28.2 pg (ref 26.0–34.0)
MCHC: 33 g/dL (ref 30.0–36.0)
MCV: 85.5 fL (ref 78.0–100.0)
Monocytes Absolute: 0.5 10*3/uL (ref 0.1–1.0)
Monocytes Relative: 6 % (ref 3–12)
Neutro Abs: 5 10*3/uL (ref 1.7–7.7)
Neutrophils Relative %: 66 % (ref 43–77)
PLATELETS: 195 10*3/uL (ref 150–400)
RBC: 4.15 MIL/uL — AB (ref 4.22–5.81)
RDW: 13.9 % (ref 11.5–15.5)
WBC: 7.5 10*3/uL (ref 4.0–10.5)

## 2014-02-22 LAB — RAPID URINE DRUG SCREEN, HOSP PERFORMED
Amphetamines: NOT DETECTED
Barbiturates: NOT DETECTED
Benzodiazepines: NOT DETECTED
Cocaine: NOT DETECTED
Opiates: NOT DETECTED
Tetrahydrocannabinol: NOT DETECTED

## 2014-02-22 LAB — COMPREHENSIVE METABOLIC PANEL
ALT: 17 U/L (ref 0–53)
AST: 19 U/L (ref 0–37)
Albumin: 3.5 g/dL (ref 3.5–5.2)
Alkaline Phosphatase: 39 U/L (ref 39–117)
Anion gap: 14 (ref 5–15)
BUN: 24 mg/dL — AB (ref 6–23)
CALCIUM: 9 mg/dL (ref 8.4–10.5)
CO2: 28 mEq/L (ref 19–32)
Chloride: 100 mEq/L (ref 96–112)
Creatinine, Ser: 0.86 mg/dL (ref 0.50–1.35)
GFR calc non Af Amer: 77 mL/min — ABNORMAL LOW (ref >90)
GFR, EST AFRICAN AMERICAN: 90 mL/min — AB (ref >90)
Glucose, Bld: 148 mg/dL — ABNORMAL HIGH (ref 70–99)
Potassium: 4.6 mEq/L (ref 3.7–5.3)
SODIUM: 142 meq/L (ref 137–147)
TOTAL PROTEIN: 6.7 g/dL (ref 6.0–8.3)
Total Bilirubin: 0.5 mg/dL (ref 0.3–1.2)

## 2014-02-22 LAB — URINALYSIS, ROUTINE W REFLEX MICROSCOPIC
Bilirubin Urine: NEGATIVE
Glucose, UA: NEGATIVE mg/dL
Hgb urine dipstick: NEGATIVE
Ketones, ur: NEGATIVE mg/dL
Leukocytes, UA: NEGATIVE
Nitrite: NEGATIVE
Protein, ur: NEGATIVE mg/dL
SPECIFIC GRAVITY, URINE: 1.02 (ref 1.005–1.030)
Urobilinogen, UA: 1 mg/dL (ref 0.0–1.0)
pH: 6 (ref 5.0–8.0)

## 2014-02-22 LAB — TROPONIN I

## 2014-02-22 LAB — ETHANOL: Alcohol, Ethyl (B): <11 mg/dL (ref 0–11)

## 2014-02-22 LAB — CBG MONITORING, ED: GLUCOSE-CAPILLARY: 206 mg/dL — AB (ref 70–99)

## 2014-02-22 LAB — APTT: aPTT: 41 seconds — ABNORMAL HIGH (ref 24–37)

## 2014-02-22 MED ORDER — SODIUM CHLORIDE 0.9 % IV SOLN
100.0000 mL/h | INTRAVENOUS | Status: DC
  Administered 2014-02-22: 100 mL/h via INTRAVENOUS

## 2014-02-22 MED ORDER — SODIUM CHLORIDE 0.9 % IV BOLUS (SEPSIS)
500.0000 mL | Freq: Once | INTRAVENOUS | Status: AC
  Administered 2014-02-22: 500 mL via INTRAVENOUS

## 2014-02-22 NOTE — ED Notes (Addendum)
Headache, and confusion, onset 1 pm  Feels sob.    Alert, talking at triage.  Confusion has improved.

## 2014-02-22 NOTE — Discharge Instructions (Signed)
As discussed, the remainder of the evaluation for this episode of unusual behavior requires close outpatient followup, additional testing.  Please be sure to speak with your physician Monday to arrange these tests.  Return here for any concerning changes in her condition, otherwise, continue to take all medication as directed.

## 2014-02-22 NOTE — ED Provider Notes (Signed)
CSN: [id number]     Arrival date & time 02/22/14  1503 History   First MD Initiated Contact with Patient 02/22/14 1603     Chief Complaint  Patient presents with  . Altered Mental Status     (Consider location/radiation/quality/duration/timing/severity/associated sxs/prior Treatment) HPI  Patient presents after one episode of altered mental status the left approximately one hour. Patient recalls that after feeling out of sorts, he had several months of atypical body movements, disorientation.  This lasted about one hour, resolved without clear intervention. The patient currently has no complaints. Patient has a notable history of diabetes, hypertension, prior stroke, prior PE. Patient is currently taking anticoagulants.   Past Medical History  Diagnosis Date  . HTN (hypertension)   . DM (diabetes mellitus)   . Carotid artery occlusion     a. 05/2012 U/S Bilat ICA 20-39%.  . Obesity   . Stroke     a. 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 01/2009 TEE: EF 60-65%, mild MR, no LA/LAA thrombus.  . Macular degeneration   . Hiatal hernia   . Nephrolithiasis    Past Surgical History  Procedure Laterality Date  . Finger surgery Left     pinky, after saw cut it off  . Cataract extraction w/phaco Right 10/02/2012    Procedure: CATARACT EXTRACTION PHACO AND INTRAOCULAR LENS PLACEMENT (IOC);  Surgeon: Gemma Payor, MD;  Location: [facility];  Service: Ophthalmology;  Laterality: Right;  CDE 12.78  . Eye surgery    . Cataract extraction w/phaco Left 10/30/2012    Procedure: CATARACT EXTRACTION PHACO AND INTRAOCULAR LENS PLACEMENT (IOC);  Surgeon: Gemma Payor, MD;  Location: [facility];  Service: Ophthalmology;  Laterality: Left;  CDE: 19.10   Family History  Problem Relation Age of Onset  . Diabetes Mother   . Stroke Mother   . Coronary artery disease Father   . Stroke Sister   . Diabetes Sister   . Coronary artery disease Brother    History  Substance Use Topics  . Smoking status: Former  Smoker -- 3.00 packs/day for 40 years    Types: Cigarettes    Quit date: 05/25/1991  . Smokeless tobacco: Never Used  . Alcohol Use: No    Review of Systems  Constitutional:       Per HPI, otherwise negative  HENT:       Per HPI, otherwise negative  Respiratory:       Per HPI, otherwise negative  Cardiovascular:       Per HPI, otherwise negative  Gastrointestinal: Negative for vomiting.  Endocrine:       Negative aside from HPI  Genitourinary:       Neg aside from HPI   Musculoskeletal:       Per HPI, otherwise negative  Skin: Negative.   Neurological: Positive for weakness. Negative for seizures and syncope.      Allergies  Aspirin  Home Medications   Prior to Admission medications   Medication Sig Start Date End Date Taking? Authorizing Provider  Ascorbic Acid (VITAMIN C) 1000 MG tablet Take 1,000 mg by mouth daily.      Historical Provider, MD  atorvastatin (LIPITOR) 80 MG tablet Take 1 tablet (80 mg total) by mouth daily at 6 PM. 03/13/13   Antoine Poche, MD  bumetanide (BUMEX) 2 MG tablet Take 2 mg by mouth daily as needed (Fluid).    Historical Provider, MD  carvedilol (COREG) 3.125 MG tablet Take 1 tablet (3.125 mg total) by mouth 2 (  two) times daily with a meal. 09/28/13   Laqueta LindenSuresh A Koneswaran, MD  donepezil (ARICEPT) 10 MG tablet Take 10 mg by mouth at bedtime.    Historical Provider, MD  glimepiride (AMARYL) 2 MG tablet Take 2 mg by mouth daily before breakfast.      Historical Provider, MD  lisinopril (PRINIVIL,ZESTRIL) 5 MG tablet Take 1 tablet (5 mg total) by mouth daily. 03/13/13   Antoine PocheJonathan F Branch, MD  metFORMIN (GLUCOPHAGE) 500 MG tablet  06/01/13   Historical Provider, MD  mirabegron ER (MYRBETRIQ) 50 MG TB24 tablet Take 50 mg by mouth daily.    Historical Provider, MD  Multiple Vitamin (MULTIVITAMIN WITH MINERALS) TABS tablet Take 1 tablet by mouth daily.    Historical Provider, MD  nitroGLYCERIN (NITROSTAT) 0.4 MG SL tablet Place 1 tablet (0.4 mg  total) under the tongue every 5 (five) minutes as needed for chest pain. 03/13/13   Antoine PocheJonathan F Branch, MD  rivaroxaban (XARELTO) 20 MG TABS tablet Take 1 tablet (20 mg total) by mouth daily with supper. 11/13/13   Laqueta LindenSuresh A Koneswaran, MD   BP 151/70  Pulse 72  Temp(Src) 97.6 F (36.4 C) (Oral)  Resp 14  Ht 5\' 10"  (1.778 m)  Wt 220 lb (99.791 kg)  BMI 31.57 kg/m2  SpO2 100% Physical Exam  Nursing note and vitals reviewed. Constitutional: He is oriented to person, place, and time. He appears well-developed. No distress.  HENT:  Head: Normocephalic and atraumatic.  Eyes: Conjunctivae and EOM are normal.  Cardiovascular: Normal rate and regular rhythm.   Pulmonary/Chest: Effort normal. No stridor. No respiratory distress.  Abdominal: He exhibits no distension.  Musculoskeletal: He exhibits no edema.  Missing the fifth digit of the left  Neurological: He is alert and oriented to person, place, and time.  Skin: Skin is warm and dry.  Psychiatric: He has a normal mood and affect.    ED Course  Procedures (including critical care time) Labs Review Labs Reviewed  CBC WITH DIFFERENTIAL - Abnormal; Notable for the following:    RBC 4.15 (*)    Hemoglobin 11.7 (*)    HCT 35.5 (*)    All other components within normal limits  CBG MONITORING, ED - Abnormal; Notable for the following:    Glucose-Capillary 206 (*)    All other components within normal limits  APTT  COMPREHENSIVE METABOLIC PANEL  ETHANOL  TROPONIN I  URINE RAPID DRUG SCREEN (HOSP PERFORMED)  URINALYSIS, ROUTINE W REFLEX MICROSCOPIC    Imaging Review Ct Head Wo Contrast  02/22/2014   CLINICAL DATA:  Onset of confusion at 13:00 hr today. Patient reports headache at the time of confusion.  EXAM: CT HEAD WITHOUT CONTRAST  TECHNIQUE: Contiguous axial images were obtained from the base of the skull through the vertex without intravenous contrast.  COMPARISON:  Head CT scan 02/14/2013.  FINDINGS: Atrophy and chronic  microvascular ischemic change are identified with basal ganglia lacunar infarctions again seen. Remote high left frontal infarction is unchanged. No evidence of acute intracranial abnormality including infarct, hemorrhage, mass lesion, mass effect, midline shift or abnormal extra-axial fluid collection is identified. There is no hydrocephalus or pneumocephalus. The calvarium is intact. Small mucous retention cyst or polyp right maxillary sinus noted.  IMPRESSION: No acute finding.  Stable compared to prior exam.   Electronically Signed   By: Drusilla Kannerhomas  Dalessio M.D.   On: 02/22/2014 18:12   Mr Brain Wo Contrast  02/22/2014   CLINICAL DATA:  Patient developed confusion with headache  today. Stroke risk factors include hypertension and diabetes.  EXAM: MRI HEAD WITHOUT CONTRAST  TECHNIQUE: Multiplanar, multiecho pulse sequences of the brain and surrounding structures were obtained without intravenous contrast.  COMPARISON:  CT head earlier today.  MR head 02/10/2009.  FINDINGS: The patient refused to complete the exam. The images which are obtained are motion degraded but overall the study provides important information.  No evidence for acute infarction, hemorrhage, mass lesion, hydrocephalus, or extra-axial fluid. Generalized cerebral and cerebellar atrophy. Moderately advanced small vessel disease throughout the periventricular and subcortical white matter. Areas of chronic infarction affect both cerebral hemispheres, and both cerebellar hemispheres. Some of these were acute in 2010. Flow voids are maintained in the major intracranial vessels. Extracranial soft tissues are grossly unremarkable. Good general agreement with the prior CT.  IMPRESSION: The patient refused to complete the exam.  See discussion above.  No acute intracranial findings are evident.  Atrophy, small vessel disease, and old areas of cerebral infarction are observed.   Electronically Signed   By: Davonna Belling M.D.   On: 02/22/2014 20:12      EKG Interpretation   Date/Time:  Friday February 22 2014 17:24:39 EDT Ventricular Rate:  66 PR Interval:  241 QRS Duration: 160 QT Interval:  439 QTC Calculation: 460 R Axis:   91 Text Interpretation:  Sinus rhythm Ventricular premature complex Prolonged  PR interval RBBB and LPFB Sinus rhythm Non-specific intra-ventricular  conduction delay Premature ventricular complexes T wave abnormality No  significant change since last tracing Abnormal ekg Confirmed by Gerhard Munch  MD [phone]) on 02/22/2014 5:34:38 PM     Update: Patient awake alert, though he had one episode of brief confusion.  Head CT unremarkable, MRI ordered.    8:48 PM On exam the following MRI, patient is awake alert, requesting to go home. I discussed the findings with him and his wife his daughter. Given that the patient is on anticoagulant, and there is no current evidence for CVA, other acute pathology, he will complete his evaluation for TIA as an outpatient.  MDM   Patient presents after an episode of atypical behavior, essentially with no complaints on arrival, or throughout his emergency department course. Patient does have a history of prior stroke, but is currently taking anticoagulant appropriately.  1 patient's evaluation here does not suggest acute CVA, nor other acute pathology. Patient has no recent carotid evaluation, and will complete this as an outpatient, as well as additional evaluation, management of his episode today.     Gerhard Munch, MD 02/22/14 2050

## 2014-02-25 ENCOUNTER — Telehealth: Payer: Self-pay | Admitting: Cardiovascular Disease

## 2014-02-25 NOTE — Telephone Encounter (Signed)
Pt notified that samples of Xarelto 20 mg are available for pickup

## 2014-02-25 NOTE — Telephone Encounter (Signed)
Samples of Xarelto / patient was denied for assistance / tgs

## 2014-04-03 ENCOUNTER — Telehealth: Payer: Self-pay | Admitting: *Deleted

## 2014-04-03 NOTE — Telephone Encounter (Signed)
PT NEEDS SAMPLES OF XARELTO 20 MG

## 2014-04-03 NOTE — Telephone Encounter (Signed)
Pt wife aware samples of xarelto are ready to be picked up. 4 bottles

## 2014-04-24 ENCOUNTER — Telehealth: Payer: Self-pay | Admitting: Cardiovascular Disease

## 2014-04-24 NOTE — Telephone Encounter (Signed)
Gave pt 4 bottles of Xarelto.

## 2014-04-24 NOTE — Telephone Encounter (Signed)
Please call his wife to come pick them up if available.

## 2014-05-02 ENCOUNTER — Encounter (HOSPITAL_COMMUNITY): Payer: Self-pay | Admitting: Cardiology

## 2014-05-10 ENCOUNTER — Other Ambulatory Visit: Payer: Self-pay | Admitting: *Deleted

## 2014-05-10 NOTE — Telephone Encounter (Signed)
SAMPLES XERALTO 20 MG

## 2014-05-10 NOTE — Telephone Encounter (Signed)
No 20 mg samples xarelto available

## 2014-05-20 ENCOUNTER — Telehealth: Payer: Self-pay | Admitting: Cardiovascular Disease

## 2014-05-20 NOTE — Telephone Encounter (Signed)
No samples available,wife is going to call pcp to see if he has any.I have messaged [facility] to see if they have any samples

## 2014-05-20 NOTE — Telephone Encounter (Signed)
Checking to see if we have samples on Xarelto 20 mg.  If not can he skip a day because he only has 2 left. / tgs

## 2014-05-21 ENCOUNTER — Other Ambulatory Visit: Payer: Self-pay | Admitting: Dermatology

## 2014-05-30 IMAGING — CR DG CHEST 1V PORT
1 series · 1 of 1 positions shown · non-contrast
Comparison: 02/14/2013

CLINICAL DATA: Chest pain

EXAM:
PORTABLE CHEST - 1 VIEW

[AP]
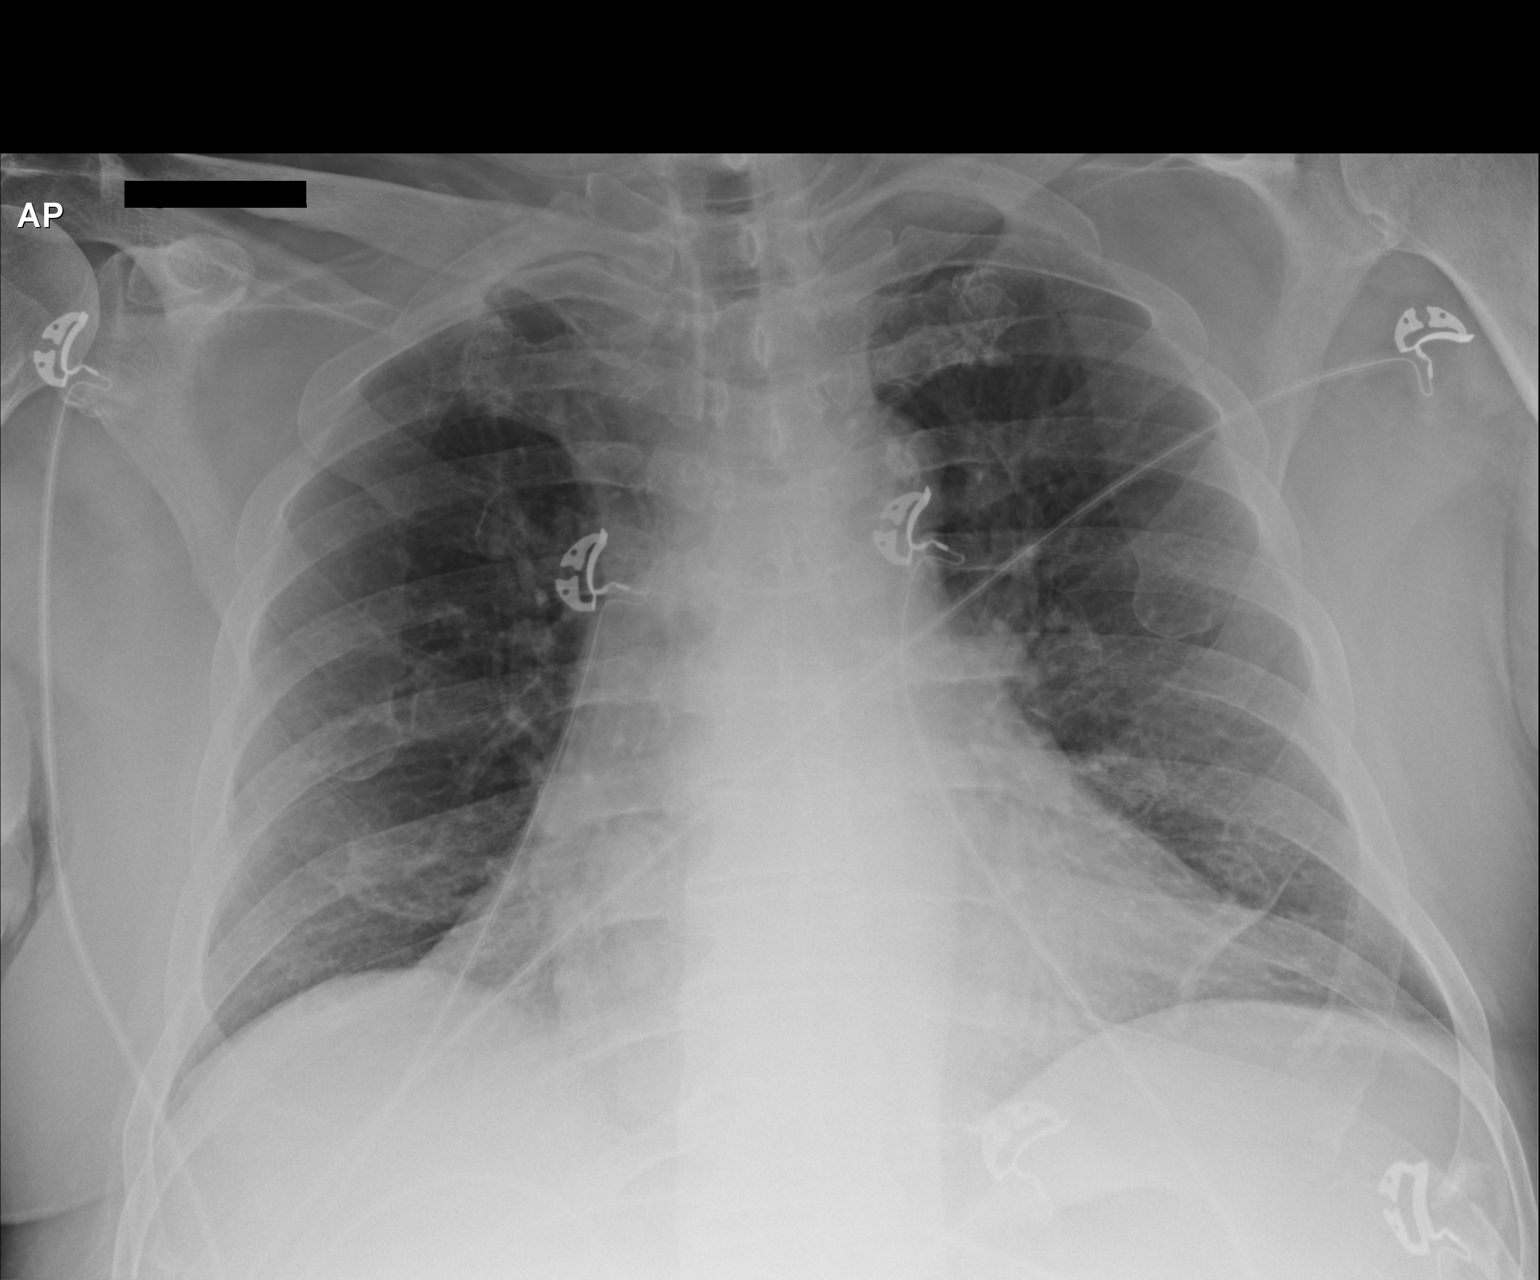

[1 of 1 positions shown; findings below may reference images not displayed]

FINDINGS: Mild cardiac enlargement. Negative for heart failure. Lungs are
clear without infiltrate effusion or mass.
IMPRESSION: No active disease.

## 2014-06-10 ENCOUNTER — Encounter: Payer: Self-pay | Admitting: Family

## 2014-06-11 ENCOUNTER — Other Ambulatory Visit (HOSPITAL_COMMUNITY): Payer: Medicare Other

## 2014-06-11 ENCOUNTER — Ambulatory Visit: Payer: Medicare Other | Admitting: Family

## 2014-06-17 ENCOUNTER — Encounter: Payer: Self-pay | Admitting: Vascular Surgery

## 2014-06-17 ENCOUNTER — Encounter: Payer: Self-pay | Admitting: Family

## 2014-06-18 ENCOUNTER — Encounter: Payer: Self-pay | Admitting: Family

## 2014-06-18 ENCOUNTER — Ambulatory Visit (HOSPITAL_COMMUNITY)
Admission: RE | Admit: 2014-06-18 | Discharge: 2014-06-18 | Disposition: A | Discharge status: Home / Self Care | Payer: Medicare Other | Source: Ambulatory Visit | Attending: Family | Admitting: Family

## 2014-06-18 ENCOUNTER — Ambulatory Visit (INDEPENDENT_AMBULATORY_CARE_PROVIDER_SITE_OTHER): Payer: Medicare Other | Admitting: Family

## 2014-06-18 VITALS — BP 125/78 | HR 64 | Resp 16 | Ht 70.5 in | Wt 240.0 lb

## 2014-06-18 DIAGNOSIS — E1165 Type 2 diabetes mellitus with hyperglycemia: Secondary | ICD-10-CM | POA: Insufficient documentation

## 2014-06-18 DIAGNOSIS — IMO0002 Reserved for concepts with insufficient information to code with codable children: Secondary | ICD-10-CM

## 2014-06-18 DIAGNOSIS — E669 Obesity, unspecified: Secondary | ICD-10-CM | POA: Insufficient documentation

## 2014-06-18 DIAGNOSIS — I6523 Occlusion and stenosis of bilateral carotid arteries: Secondary | ICD-10-CM | POA: Diagnosis not present

## 2014-06-18 DIAGNOSIS — Z87891 Personal history of nicotine dependence: Secondary | ICD-10-CM

## 2014-06-18 DIAGNOSIS — Z8673 Personal history of transient ischemic attack (TIA), and cerebral infarction without residual deficits: Secondary | ICD-10-CM | POA: Insufficient documentation

## 2014-06-18 NOTE — Patient Instructions (Signed)
Stroke Prevention Some medical conditions and behaviors are associated with an increased chance of having a stroke. You may prevent a stroke by making healthy choices and managing medical conditions. HOW CAN I REDUCE MY RISK OF HAVING A STROKE?   Stay physically active. Get at least 30 minutes of activity on most or all days.  Do not smoke. It may also be helpful to avoid exposure to secondhand smoke.  Limit alcohol use. Moderate alcohol use is considered to be:  No more than 2 drinks per day for men.  No more than 1 drink per day for nonpregnant women.  Eat healthy foods. This involves:  Eating 5 or more servings of fruits and vegetables a day.  Making dietary changes that address high blood pressure (hypertension), high cholesterol, diabetes, or obesity.  Manage your cholesterol levels.  Making food choices that are high in fiber and low in saturated fat, trans fat, and cholesterol may control cholesterol levels.  Take any prescribed medicines to control cholesterol as directed by your health care provider.  Manage your diabetes.  Controlling your carbohydrate and sugar intake is recommended to manage diabetes.  Take any prescribed medicines to control diabetes as directed by your health care provider.  Control your hypertension.  Making food choices that are low in salt (sodium), saturated fat, trans fat, and cholesterol is recommended to manage hypertension.  Take any prescribed medicines to control hypertension as directed by your health care provider.  Maintain a healthy weight.  Reducing calorie intake and making food choices that are low in sodium, saturated fat, trans fat, and cholesterol are recommended to manage weight.  Stop drug abuse.  Avoid taking birth control pills.  Talk to your health care provider about the risks of taking birth control pills if you are over 35 years old, smoke, get migraines, or have ever had a blood clot.  Get evaluated for sleep  disorders (sleep apnea).  Talk to your health care provider about getting a sleep evaluation if you snore a lot or have excessive sleepiness.  Take medicines only as directed by your health care provider.  For some people, aspirin or blood thinners (anticoagulants) are helpful in reducing the risk of forming abnormal blood clots that can lead to stroke. If you have the irregular heart rhythm of atrial fibrillation, you should be on a blood thinner unless there is a good reason you cannot take them.  Understand all your medicine instructions.  Make sure that other conditions (such as anemia or atherosclerosis) are addressed. SEEK IMMEDIATE MEDICAL CARE IF:   You have sudden weakness or numbness of the face, arm, or leg, especially on one side of the body.  Your face or eyelid droops to one side.  You have sudden confusion.  You have trouble speaking (aphasia) or understanding.  You have sudden trouble seeing in one or both eyes.  You have sudden trouble walking.  You have dizziness.  You have a loss of balance or coordination.  You have a sudden, severe headache with no known cause.  You have new chest pain or an irregular heartbeat. Any of these symptoms may represent a serious problem that is an emergency. Do not wait to see if the symptoms will go away. Get medical help at once. Call your local emergency services (911 in [place] Do not drive yourself to the hospital. Document Released: 06/17/2004 Document Revised: 09/24/2013 Document Reviewed: 11/10/2012 Exit[employer] Patient Information 2015 [employer]. This information is not intended to replace advice given   to you by your health care provider. Make sure you discuss any questions you have with your health care provider.  

## 2014-06-18 NOTE — Progress Notes (Signed)
Established Carotid Patient   History of Present Illness  George Cook is a 81 y.o. male patient of Dr. Hart [place] followed for known carotid artery stenosis.  Patient has not had previous carotid artery intervention. Had a stroke about 2005, he does not recall his symptoms, denies any residual neurological deficit. Pt denies steal symptoms in either arm.  Pt reports New Medical or Surgical History: hospitalized at [facility] October 2014 for PE  Pt Diabetic: Yes, pt indicates uncontrolled for the most part Pt smoker: former smoker, quit in 1993  Pt meds include: Statin : Yes Betablocker: Yes ASA: No Other anticoagulants/antiplatelets: Xaralto for PE, Plavix was stopped    Past Medical History  Diagnosis Date  . HTN (hypertension)   . DM (diabetes mellitus)   . Carotid artery occlusion     a. 05/2012 U/S Bilat ICA 20-39%.  . Obesity   . Stroke     a. 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 01/2009 TEE: EF 60-65%, mild MR, no LA/LAA thrombus.  . Macular degeneration   . Hiatal hernia   . Nephrolithiasis     Social History History  Substance Use Topics  . Smoking status: Former Smoker -- 3.00 packs/day for 40 years    Types: Cigarettes    Quit date: 05/25/1991  . Smokeless tobacco: Never Used  . Alcohol Use: No    Family History Family History  Problem Relation Age of Onset  . Diabetes Mother   . Stroke Mother   . Emphysema Mother   . Hypertension Mother   . Coronary artery disease Father   . Heart disease Father     Beefore age 64  . Stroke Sister   . Diabetes Sister   . Hypertension Sister   . Coronary artery disease Brother   . Heart disease Brother     Before age 63- CABG    Surgical History Past Surgical History  Procedure Laterality Date  . Finger surgery Left     pinky, after saw cut it off  . Cataract extraction w/phaco Right 10/02/2012    Procedure: CATARACT EXTRACTION PHACO AND INTRAOCULAR LENS PLACEMENT (IOC);  Surgeon: Gemma Payor, MD;   Location: [facility];  Service: Ophthalmology;  Laterality: Right;  CDE 12.78  . Eye surgery    . Cataract extraction w/phaco Left 10/30/2012    Procedure: CATARACT EXTRACTION PHACO AND INTRAOCULAR LENS PLACEMENT (IOC);  Surgeon: Gemma Payor, MD;  Location: [facility];  Service: Ophthalmology;  Laterality: Left;  CDE: 19.10  . Left heart catheterization with coronary angiogram Bilateral 03/08/2013    Procedure: LEFT HEART CATHETERIZATION WITH CORONARY ANGIOGRAM;  Surgeon: Laurey Morale, MD;  Location: [facility] CATH LAB;  Service: Cardiovascular;  Laterality: Bilateral;    Allergies  Allergen Reactions  . Aspirin Hives and Rash    Current Outpatient Prescriptions  Medication Sig Dispense Refill  . Ascorbic Acid (VITAMIN C) 1000 MG tablet Take 1,500 mg by mouth daily.     Marland Kitchen atorvastatin (LIPITOR) 80 MG tablet Take 1 tablet (80 mg total) by mouth daily at 6 PM. 30 tablet 3  . bumetanide (BUMEX) 2 MG tablet Take 2 mg by mouth daily. 1/2 tab daily    . carvedilol (COREG) 3.125 MG tablet Take 1 tablet (3.125 mg total) by mouth 2 (two) times daily with a meal. 180 tablet 3  . Cholecalciferol (VITAMIN D) 2000 UNITS CAPS Take 1 capsule by mouth daily after supper.    . docusate sodium (COLACE) 100 MG capsule Take  100 mg by mouth 2 (two) times daily.    Marland Kitchen donepezil (ARICEPT) 10 MG tablet Take 10 mg by mouth daily after supper.     Marland Kitchen glimepiride (AMARYL) 2 MG tablet Take 2 mg by mouth daily before breakfast.      . lisinopril (PRINIVIL,ZESTRIL) 5 MG tablet Take 1 tablet (5 mg total) by mouth daily. (Patient taking differently: Take 5 mg by mouth 2 (two) times daily. ) 30 tablet 3  . metFORMIN (GLUCOPHAGE) 500 MG tablet Take 500 mg by mouth 2 (two) times daily.     . Multiple Vitamin (MULTIVITAMIN WITH MINERALS) TABS tablet Take 1 tablet by mouth daily.    . nitroGLYCERIN (NITROSTAT) 0.4 MG SL tablet Place 1 tablet (0.4 mg total) under the tongue every 5 (five) minutes as needed for chest pain. 25 tablet 3  .  rivaroxaban (XARELTO) 20 MG TABS tablet Take 20 mg by mouth every morning.     No current facility-administered medications for this visit.    Review of Systems : See HPI for pertinent positives and negatives.  Physical Examination  Filed Vitals:   06/18/14 1140 06/18/14 1145  BP: 146/81 125/78  Pulse: 63 64  Resp:  16  Height:  5' 10.5" (1.791 m)  Weight:  240 lb (108.863 kg)  SpO2:  97%   Body mass index is 33.94 kg/(m^2).  General: WDWN obese male in NAD GAIT: using cane, slow and deliberate Eyes: PERRLA Pulmonary: CTAB, Negative Rales, Negative rhonchi, & transient wheezing in right posterior fields. Is dyspneic getting on exam table.  Cardiac: regular Rhythm, positive murmur.  VASCULAR EXAM Carotid Bruits Left Right   transmitted cardiac murmur Transmitted cardiac murmur    Radial pulses: right is not palpable, left is 1+, right brachial pulse is 2+.      LE Pulses LEFT RIGHT   POPLITEAL not palpable  not palpable   POSTERIOR TIBIAL not palpable  not palpable    DORSALIS PEDIS  ANTERIOR TIBIAL not palpable  not palpable     Gastrointestinal: soft, nontender, BS WNL, no r/g, no palpable masses.  Musculoskeletal: Negative muscle atrophy/wasting. M/S 5/5 in UE's, 4/5 in LE's, Extremities without ischemic changes. Is wearing knee high graduate compression hose, has bilateral 1-2+ pitting edema in lower legs.  Neurologic: A&O X 3; Appropriate Affect ; SENSATION ;normal;  Speech is loud CN 2-12 intact Except is hard of hearing, Pain and light touch intact in extremities, Motor exam as listed above.    Non-Invasive Vascular Imaging CAROTID DUPLEX 06/18/2014   CEREBROVASCULAR DUPLEX EVALUATION    INDICATION: Carotid artery disease    PREVIOUS INTERVENTION(S): None    DUPLEX EXAM:  Carotid duplex    RIGHT  LEFT  Peak Systolic Velocities (cm/s) End Diastolic Velocities (cm/s) Plaque LOCATION Peak Systolic Velocities (cm/s) End Diastolic Velocities (cm/s) Plaque  48 8 - CCA PROXIMAL 81 11 -  42 10 HT CCA MID 61 7 HT  41 10 HT CCA DISTAL 46 8 HT  34 5 - ECA 56 4 -  88 23 HT ICA PROXIMAL 146 43 HT  117 25 - ICA MID 130 31 -  76 19 - ICA DISTAL 109 33 -    2.7 ICA / CCA Ratio (PSV) 2.4  Antegrade Vertebral Flow Antegrade  160 Brachial Systolic Pressure (mmHg) 150  Biphasic Brachial Artery Waveforms Triphasic    Plaque Morphology:  HM = Homogeneous, HT = Heterogeneous, CP = Calcific Plaque, SP = Smooth Plaque, IP = Irregular Plaque  ADDITIONAL FINDINGS:     IMPRESSION: 1. Less than 40% right internal carotid artery stenosis. 2. 40 - 59% left internal carotid artery stenosis.    Compared to the previous exam:  No change      Assessment: Arletha GrippeBilly L Loa is a 81 y.o. male who presents with asymptomatic minimal right ICA stenosis and 40 - 59% left internal carotid artery stenosis. The  ICA stenosis is  Unchanged from previous exam. His atherosclerotic risk factors include uncontrolled DM, former smoker, obesity, all currently under medical management.  Face to face time with patient was 25 minutes. Over 50% of this time was spent on counseling and coordination of care.   Plan: Follow-up in 1 year with Carotid Duplex.   I discussed in depth with the patient the nature of atherosclerosis, and emphasized the importance of maximal medical management including strict control of blood pressure, blood glucose, and lipid levels, obtaining regular exercise, and continued cessation of smoking.  The patient is aware that without maximal medical management the underlying atherosclerotic disease process will progress, limiting the benefit of any interventions. The patient was given information about stroke prevention and what symptoms should prompt the patient to seek immediate  medical care. Thank you for allowing us to participate in this patient's care.  Charisse MarchSuzanne Ardena Gangl, RN, MSN, FNP-C [facility] [place] Office: [phone]  Clinic Physician: Hart RochesterLawson  06/18/2014  12:03 PM

## 2014-07-15 ENCOUNTER — Ambulatory Visit: Payer: Self-pay | Admitting: Cardiovascular Disease

## 2014-07-18 ENCOUNTER — Encounter: Payer: Self-pay | Admitting: Cardiovascular Disease

## 2014-07-18 ENCOUNTER — Ambulatory Visit (INDEPENDENT_AMBULATORY_CARE_PROVIDER_SITE_OTHER): Payer: Medicare Other | Admitting: Cardiovascular Disease

## 2014-07-18 VITALS — BP 130/70 | HR 71 | Wt 214.0 lb

## 2014-07-18 DIAGNOSIS — I10 Essential (primary) hypertension: Secondary | ICD-10-CM

## 2014-07-18 DIAGNOSIS — I2699 Other pulmonary embolism without acute cor pulmonale: Secondary | ICD-10-CM

## 2014-07-18 DIAGNOSIS — I2510 Atherosclerotic heart disease of native coronary artery without angina pectoris: Secondary | ICD-10-CM

## 2014-07-18 DIAGNOSIS — I82401 Acute embolism and thrombosis of unspecified deep veins of right lower extremity: Secondary | ICD-10-CM

## 2014-07-18 DIAGNOSIS — E785 Hyperlipidemia, unspecified: Secondary | ICD-10-CM

## 2014-07-18 DIAGNOSIS — I350 Nonrheumatic aortic (valve) stenosis: Secondary | ICD-10-CM

## 2014-07-18 NOTE — Progress Notes (Signed)
Patient ID: George Cook, male   DOB: [birth year], 81 y.o.   MRN: [id number]      SUBJECTIVE: George Cook presents for routine follow up. I last saw him in 12/2013. He has a history of bilateral PE and right popliteal DVT, for which he takes Xarelto. He also has mild to moderate nonobstructive coronary artery disease. He has normal LV systolic function and moderate aortic stenosis by echocardiogram on 03/08/13.  He also has essential hypertension, history of CVA, and type II diabetes. He is allergic to ASA, and is on high-dose Lipitor.   He denies exertional chest discomfort and says his baseline exertional dyspnea has not gotten any worse. He said his leg swelling is under good control with his compression stockings and Bumex as needed.  He also denies dizziness, lightheadedness, and syncope.   Review of Systems: As per "subjective", otherwise negative.  Allergies  Allergen Reactions  . Aspirin Hives and Rash    Current Outpatient Prescriptions  Medication Sig Dispense Refill  . Ascorbic Acid (VITAMIN C) 1000 MG tablet Take 1,500 mg by mouth daily.     Marland Kitchen atorvastatin (LIPITOR) 80 MG tablet Take 1 tablet (80 mg total) by mouth daily at 6 PM. 30 tablet 3  . bumetanide (BUMEX) 2 MG tablet Take 2 mg by mouth daily. 1/2 tab daily    . carvedilol (COREG) 3.125 MG tablet Take 1 tablet (3.125 mg total) by mouth 2 (two) times daily with a meal. 180 tablet 3  . Cholecalciferol (VITAMIN D) 2000 UNITS CAPS Take 1 capsule by mouth daily after supper.    . docusate sodium (COLACE) 100 MG capsule Take 100 mg by mouth 2 (two) times daily.    Marland Kitchen donepezil (ARICEPT) 10 MG tablet Take 10 mg by mouth daily after supper.     Marland Kitchen glimepiride (AMARYL) 2 MG tablet Take 2 mg by mouth daily before breakfast.      . lisinopril (PRINIVIL,ZESTRIL) 5 MG tablet Take 1 tablet (5 mg total) by mouth daily. (Patient taking differently: Take 5 mg by mouth 2 (two) times daily. ) 30 tablet 3  . metFORMIN (GLUCOPHAGE) 500 MG  tablet Take 500 mg by mouth 2 (two) times daily.     . Multiple Vitamin (MULTIVITAMIN WITH MINERALS) TABS tablet Take 1 tablet by mouth daily.    . nitroGLYCERIN (NITROSTAT) 0.4 MG SL tablet Place 1 tablet (0.4 mg total) under the tongue every 5 (five) minutes as needed for chest pain. 25 tablet 3  . rivaroxaban (XARELTO) 20 MG TABS tablet Take 20 mg by mouth every morning.     No current facility-administered medications for this visit.    Past Medical History  Diagnosis Date  . HTN (hypertension)   . DM (diabetes mellitus)   . Carotid artery occlusion     a. 05/2012 U/S Bilat ICA 20-39%.  . Obesity   . Stroke     a. 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 01/2009 TEE: EF 60-65%, mild MR, no LA/LAA thrombus.  . Macular degeneration   . Hiatal hernia   . Nephrolithiasis     Past Surgical History  Procedure Laterality Date  . Finger surgery Left     pinky, after saw cut it off  . Cataract extraction w/phaco Right 10/02/2012    Procedure: CATARACT EXTRACTION PHACO AND INTRAOCULAR LENS PLACEMENT (IOC);  Surgeon: Gemma Payor, MD;  Location: [facility];  Service: Ophthalmology;  Laterality: Right;  CDE 12.78  . Eye surgery    .  Cataract extraction w/phaco Left 10/30/2012    Procedure: CATARACT EXTRACTION PHACO AND INTRAOCULAR LENS PLACEMENT (IOC);  Surgeon: Gemma PayorKerry Hunt, MD;  Location: [facility];  Service: Ophthalmology;  Laterality: Left;  CDE: 19.10  . Left heart catheterization with coronary angiogram Bilateral 03/08/2013    Procedure: LEFT HEART CATHETERIZATION WITH CORONARY ANGIOGRAM;  Surgeon: Laurey Moralealton S McLean, MD;  [facility];  Service: Cardiovascular;  Laterality: Bilateral;    History   Social History  . Marital Status: Married    Spouse Name: N/A  . Number of Children: N/A  . Years of Education: N/A   Occupational History  . Not on file.   Social History Main Topics  . Smoking status: Former Smoker -- 3.00 packs/day for 40 years    Types: Cigarettes    Start date:  05/24/1949    Quit date: 05/25/1991  . Smokeless tobacco: Never Used  . Alcohol Use: No  . Drug Use: No  . Sexual Activity: Not on file   Other Topics Concern  . Not on file   Social History Narrative   Lives in [place] with wife.  Sedentary.  Does not exercise.  Spends most of the day in his recliner.     Filed Vitals:   07/18/14 1104  BP: 130/70  Pulse: 71  Weight: 214 lb (97.07 kg)  SpO2: 97%    PHYSICAL EXAM General: NAD, mildly tachypneic. Neck: No JVD, no thyromegaly or thyroid nodule.  Lungs: Clear to auscultation bilaterally with normal respiratory effort.  CV: Nondisplaced PMI. Heart regular S1/diminished S2, no S3/S4, III/VI late peaking, crescendo-decrescendo systolic murmur at RUSB. Trace peripheral edema with compression stockings. No carotid bruit.  Abdomen: Soft, nontender, no hepatosplenomegaly, no distention.  Neurologic: Alert and oriented x 3.  Psych: Normal affect.  Skin: Normal. Musculoskeletal: No gross deformities. Extremities: No clubbing or cyanosis.   ECG: Most recent ECG reviewed.    ASSESSMENT AND PLAN: 1. Bilateral PE and right popliteal DVT: Continue Xarelto lifelong.  2. Mild to moderate CAD: Symptomatically stable. Allergic to ASA. Continue high-dose Lipitor, along with Coreg and lisinopril.  3. Essential HTN: Controlled on present therapy. No changes. 4. Hyperlipidemia: Continue Lipitor.  5. Aortic stenosis: Moderate when last assessed in 02/2013. S2 sounds diminished and his murmur is late-peaking. I will order an echocardiogram to assess for interval progression. He is also mildly tachypneic, although he has baseline chronic dyspnea.   Dispo: f/u 6 months.  Prentice DockerSuresh Koneswaran, M.D., F.A.C.C.

## 2014-07-18 NOTE — Patient Instructions (Signed)
Your physician recommends that you schedule a follow-up appointment in: 6 months with Dr Purvis SheffieldKoneswaran   Your physician recommends that you continue on your current medications as directed. Please refer to the Current Medication list given to you today.    Your physician has requested that you have an echocardiogram. Echocardiography is a painless test that uses sound waves to create images of your heart. It provides your doctor with information about the size and shape of your heart and how well your heart's chambers and valves are working. This procedure takes approximately one hour. There are no restrictions for this procedure.      Thank you for choosing [facility] !

## 2014-07-24 ENCOUNTER — Ambulatory Visit (HOSPITAL_COMMUNITY)
Admission: RE | Admit: 2014-07-24 | Discharge: 2014-07-24 | Disposition: A | Discharge status: Home / Self Care | Payer: Medicare Other | Source: Ambulatory Visit | Attending: Cardiovascular Disease | Admitting: Cardiovascular Disease

## 2014-07-24 DIAGNOSIS — I081 Rheumatic disorders of both mitral and tricuspid valves: Secondary | ICD-10-CM | POA: Diagnosis not present

## 2014-07-24 DIAGNOSIS — I10 Essential (primary) hypertension: Secondary | ICD-10-CM | POA: Diagnosis not present

## 2014-07-24 DIAGNOSIS — I517 Cardiomegaly: Secondary | ICD-10-CM

## 2014-07-24 DIAGNOSIS — Z7901 Long term (current) use of anticoagulants: Secondary | ICD-10-CM | POA: Insufficient documentation

## 2014-07-24 DIAGNOSIS — I2510 Atherosclerotic heart disease of native coronary artery without angina pectoris: Secondary | ICD-10-CM | POA: Diagnosis not present

## 2014-07-24 DIAGNOSIS — I358 Other nonrheumatic aortic valve disorders: Secondary | ICD-10-CM | POA: Diagnosis present

## 2014-07-24 DIAGNOSIS — Z86711 Personal history of pulmonary embolism: Secondary | ICD-10-CM | POA: Insufficient documentation

## 2014-07-24 DIAGNOSIS — Z87891 Personal history of nicotine dependence: Secondary | ICD-10-CM | POA: Insufficient documentation

## 2014-07-24 DIAGNOSIS — Z79899 Other long term (current) drug therapy: Secondary | ICD-10-CM | POA: Insufficient documentation

## 2014-07-24 DIAGNOSIS — E119 Type 2 diabetes mellitus without complications: Secondary | ICD-10-CM | POA: Insufficient documentation

## 2014-07-24 DIAGNOSIS — I350 Nonrheumatic aortic (valve) stenosis: Secondary | ICD-10-CM

## 2014-07-24 NOTE — Progress Notes (Signed)
*  PRELIMINARY RESULTS* Echocardiogram 2D Echocardiogram has been performed.  Jeryl ColumbiaLLIOTT, Averie Meiner 07/24/2014, 11:05 AM

## 2014-10-07 ENCOUNTER — Telehealth: Payer: Self-pay | Admitting: Cardiovascular Disease

## 2014-10-07 MED ORDER — APIXABAN 5 MG PO TABS: 5.0000 mg | ORAL_TABLET | Freq: Two times a day (BID) | ORAL | Status: DC

## 2014-10-07 NOTE — Telephone Encounter (Signed)
Wife states the [facility] can get Eliquis cheaper for patient and would like to switch from Xarelto

## 2014-10-07 NOTE — Telephone Encounter (Signed)
Wife aware

## 2014-10-07 NOTE — Telephone Encounter (Signed)
That would be fine. Would switch to apixaban 5 mg bid.

## 2014-10-07 NOTE — Telephone Encounter (Signed)
Patient's wife would like to discuss changing his "blood thinner" [place] [facility]

## 2014-10-17 ENCOUNTER — Telehealth: Payer: Self-pay | Admitting: Cardiovascular Disease

## 2014-10-17 NOTE — Telephone Encounter (Signed)
Per Lorin PicketScott at [facility],pt picked up Eliquis and had $40 co-pay which is the cheapest he can get,LM for him to call back

## 2014-10-18 NOTE — Telephone Encounter (Signed)
Fax wrong,sent to [phone]

## 2014-10-18 NOTE — Telephone Encounter (Signed)
Needs rx and office note to [facility] fax [phone]

## 2014-10-25 ENCOUNTER — Other Ambulatory Visit: Payer: Self-pay | Admitting: Cardiovascular Disease

## 2014-11-07 ENCOUNTER — Telehealth: Payer: Self-pay | Admitting: Cardiovascular Disease

## 2014-11-07 NOTE — Telephone Encounter (Signed)
Forward to K lawrence NP to make dispo

## 2014-11-07 NOTE — Telephone Encounter (Signed)
Ok to print office note and send Rx as requested when you get the phone number.

## 2014-11-07 NOTE — Telephone Encounter (Signed)
Amber with the [facility] in [place] said the patient came in to pick up his Eliquis today but they couldn't fill it because they never received the information that they need.  Please fax the Rx request AND the last office note that explains why the patient cannot take Kerin Salen but instead must have Eliquis to [phone]. Attn: Amber & Dr. Marko Stai  I apologize I didn't get her phone number.

## 2014-11-08 ENCOUNTER — Telehealth: Payer: Self-pay

## 2014-11-08 ENCOUNTER — Telehealth: Payer: Self-pay | Admitting: Cardiovascular Disease

## 2014-11-08 NOTE — Telephone Encounter (Signed)
Mrs. Kornfeld did not get a phone number other than the generic [phone]) for [place] to call them back.  [facility] spoke to the patient/patient's wife this morning and told them that they need the refill & documentation of why he must take Eliquis.  I informed the patients wife that we sent two faxes yesterday to two different fax numbers that the [facility] has given [place] but we will try to fax it out again today.

## 2014-11-08 NOTE — Telephone Encounter (Signed)
Second faxed message sent to [facility] and Dr Marko Stai at [facility].I have asked them to call me regarding Mr George Cook's medication.To clarify that he May take Xarelto as the [place] requests.The only reason we switched  him to Eliquis is because the  wife called me on 10/07/14 telling me the [place] could get Eliquis  Cheaper. Mrs George Cook has been informed of this and is aware we are awaitng call from [place]     1136 hrs: I faxed rx with letter to [facility] stating pt can take Xarelto to [phone] per K.lawrence NP      I spoke with wife and she is aware that GeorgeCook will stop Eliquis and begin Xarelto

## 2014-12-13 ENCOUNTER — Inpatient Hospital Stay (HOSPITAL_COMMUNITY)
Admission: EM | Admit: 2014-12-13 | Discharge: 2014-12-16 | DRG: 065 | Disposition: A | Discharge status: Home Health | Payer: Medicare Other | Attending: Family Medicine | Admitting: Family Medicine

## 2014-12-13 ENCOUNTER — Emergency Department (HOSPITAL_COMMUNITY): Payer: Medicare Other

## 2014-12-13 ENCOUNTER — Encounter (HOSPITAL_COMMUNITY): Payer: Self-pay | Admitting: Emergency Medicine

## 2014-12-13 ENCOUNTER — Observation Stay (HOSPITAL_COMMUNITY): Payer: Medicare Other

## 2014-12-13 DIAGNOSIS — H3530 Unspecified macular degeneration: Secondary | ICD-10-CM | POA: Diagnosis present

## 2014-12-13 DIAGNOSIS — R0602 Shortness of breath: Secondary | ICD-10-CM

## 2014-12-13 DIAGNOSIS — Z833 Family history of diabetes mellitus: Secondary | ICD-10-CM

## 2014-12-13 DIAGNOSIS — G458 Other transient cerebral ischemic attacks and related syndromes: Secondary | ICD-10-CM | POA: Diagnosis not present

## 2014-12-13 DIAGNOSIS — I10 Essential (primary) hypertension: Secondary | ICD-10-CM | POA: Diagnosis present

## 2014-12-13 DIAGNOSIS — Z86711 Personal history of pulmonary embolism: Secondary | ICD-10-CM

## 2014-12-13 DIAGNOSIS — R531 Weakness: Secondary | ICD-10-CM | POA: Diagnosis not present

## 2014-12-13 DIAGNOSIS — Z8673 Personal history of transient ischemic attack (TIA), and cerebral infarction without residual deficits: Secondary | ICD-10-CM

## 2014-12-13 DIAGNOSIS — Z8249 Family history of ischemic heart disease and other diseases of the circulatory system: Secondary | ICD-10-CM

## 2014-12-13 DIAGNOSIS — E119 Type 2 diabetes mellitus without complications: Secondary | ICD-10-CM | POA: Diagnosis not present

## 2014-12-13 DIAGNOSIS — Z66 Do not resuscitate: Secondary | ICD-10-CM | POA: Diagnosis present

## 2014-12-13 DIAGNOSIS — I639 Cerebral infarction, unspecified: Secondary | ICD-10-CM | POA: Diagnosis not present

## 2014-12-13 DIAGNOSIS — Z825 Family history of asthma and other chronic lower respiratory diseases: Secondary | ICD-10-CM

## 2014-12-13 DIAGNOSIS — Z7901 Long term (current) use of anticoagulants: Secondary | ICD-10-CM

## 2014-12-13 DIAGNOSIS — Z823 Family history of stroke: Secondary | ICD-10-CM

## 2014-12-13 DIAGNOSIS — Z87891 Personal history of nicotine dependence: Secondary | ICD-10-CM

## 2014-12-13 DIAGNOSIS — G459 Transient cerebral ischemic attack, unspecified: Secondary | ICD-10-CM | POA: Diagnosis present

## 2014-12-13 DIAGNOSIS — J441 Chronic obstructive pulmonary disease with (acute) exacerbation: Secondary | ICD-10-CM | POA: Diagnosis not present

## 2014-12-13 DIAGNOSIS — I350 Nonrheumatic aortic (valve) stenosis: Secondary | ICD-10-CM | POA: Diagnosis present

## 2014-12-13 LAB — URINALYSIS, ROUTINE W REFLEX MICROSCOPIC
BILIRUBIN URINE: NEGATIVE
Glucose, UA: NEGATIVE mg/dL
Hgb urine dipstick: NEGATIVE
KETONES UR: NEGATIVE mg/dL
LEUKOCYTES UA: NEGATIVE
NITRITE: NEGATIVE
PROTEIN: NEGATIVE mg/dL
Specific Gravity, Urine: >1.03 — ABNORMAL HIGH (ref 1.005–1.030)
UROBILINOGEN UA: 0.2 mg/dL (ref 0.0–1.0)
pH: 5.5 (ref 5.0–8.0)

## 2014-12-13 LAB — BLOOD GAS, ARTERIAL
Acid-Base Excess: 0.5 mmol/L (ref 0.0–2.0)
Bicarbonate: 24.3 mEq/L — ABNORMAL HIGH (ref 20.0–24.0)
Drawn by: 23534
O2 CONTENT: 2 L/min
O2 Saturation: 98.4 %
PATIENT TEMPERATURE: 37
PCO2 ART: 37.7 mmHg (ref 35.0–45.0)
TCO2: 21.7 mmol/L (ref 0–100)
pH, Arterial: 7.426 (ref 7.350–7.450)
pO2, Arterial: 126 mmHg — ABNORMAL HIGH (ref 80.0–100.0)

## 2014-12-13 LAB — CBC
HCT: 37.2 % — ABNORMAL LOW (ref 39.0–52.0)
Hemoglobin: 12.2 g/dL — ABNORMAL LOW (ref 13.0–17.0)
MCH: 27.6 pg (ref 26.0–34.0)
MCHC: 32.8 g/dL (ref 30.0–36.0)
MCV: 84.2 fL (ref 78.0–100.0)
Platelets: 183 10*3/uL (ref 150–400)
RBC: 4.42 MIL/uL (ref 4.22–5.81)
RDW: 14.1 % (ref 11.5–15.5)
WBC: 6.3 10*3/uL (ref 4.0–10.5)

## 2014-12-13 LAB — GLUCOSE, CAPILLARY
GLUCOSE-CAPILLARY: 45 mg/dL — AB (ref 65–99)
GLUCOSE-CAPILLARY: 145 mg/dL — AB (ref 65–99)
Glucose-Capillary: 53 mg/dL — ABNORMAL LOW (ref 65–99)

## 2014-12-13 LAB — COMPREHENSIVE METABOLIC PANEL
ALBUMIN: 3.7 g/dL (ref 3.5–5.0)
ALK PHOS: 36 U/L — AB (ref 38–126)
ALT: 15 U/L — AB (ref 17–63)
AST: 22 U/L (ref 15–41)
Anion gap: 7 (ref 5–15)
BUN: 21 mg/dL — ABNORMAL HIGH (ref 6–20)
CALCIUM: 9.4 mg/dL (ref 8.9–10.3)
CHLORIDE: 104 mmol/L (ref 101–111)
CO2: 28 mmol/L (ref 22–32)
Creatinine, Ser: 0.92 mg/dL (ref 0.61–1.24)
Glucose, Bld: 126 mg/dL — ABNORMAL HIGH (ref 65–99)
POTASSIUM: 4.3 mmol/L (ref 3.5–5.1)
Sodium: 139 mmol/L (ref 135–145)
Total Bilirubin: 0.6 mg/dL (ref 0.3–1.2)
Total Protein: 6.5 g/dL (ref 6.5–8.1)

## 2014-12-13 LAB — DIFFERENTIAL
BASOS ABS: 0 10*3/uL (ref 0.0–0.1)
Basophils Relative: 0 % (ref 0–1)
Eosinophils Absolute: 0.3 10*3/uL (ref 0.0–0.7)
Eosinophils Relative: 4 % (ref 0–5)
LYMPHS ABS: 1.2 10*3/uL (ref 0.7–4.0)
Lymphocytes Relative: 19 % (ref 12–46)
Monocytes Absolute: 0.5 10*3/uL (ref 0.1–1.0)
Monocytes Relative: 7 % (ref 3–12)
NEUTROS ABS: 4.4 10*3/uL (ref 1.7–7.7)
Neutrophils Relative %: 70 % (ref 43–77)

## 2014-12-13 LAB — BRAIN NATRIURETIC PEPTIDE: B Natriuretic Peptide: 187 pg/mL — ABNORMAL HIGH (ref 0.0–100.0)

## 2014-12-13 LAB — RAPID URINE DRUG SCREEN, HOSP PERFORMED
Amphetamines: NOT DETECTED
Barbiturates: NOT DETECTED
Benzodiazepines: NOT DETECTED
COCAINE: NOT DETECTED
Opiates: NOT DETECTED
Tetrahydrocannabinol: NOT DETECTED

## 2014-12-13 LAB — PROTIME-INR
INR: 1.37 (ref 0.00–1.49)
Prothrombin Time: 17 seconds — ABNORMAL HIGH (ref 11.6–15.2)

## 2014-12-13 LAB — TROPONIN I

## 2014-12-13 LAB — APTT: aPTT: 34 seconds (ref 24–37)

## 2014-12-13 LAB — ETHANOL

## 2014-12-13 MED ORDER — APIXABAN 5 MG PO TABS
5.0000 mg | ORAL_TABLET | Freq: Two times a day (BID) | ORAL | Status: DC
Start: 2014-12-13 — End: 2014-12-17
  Administered 2014-12-13 – 2014-12-15 (×5): 5 mg via ORAL
  Filled 2014-12-13 (×5): qty 1

## 2014-12-13 MED ORDER — BUMETANIDE 1 MG PO TABS
1.0000 mg | ORAL_TABLET | Freq: Every day | ORAL | Status: DC
  Administered 2014-12-14 – 2014-12-15 (×2): 1 mg via ORAL
  Filled 2014-12-13 (×2): qty 1

## 2014-12-13 MED ORDER — PREDNISONE 20 MG PO TABS
40.0000 mg | ORAL_TABLET | Freq: Every day | ORAL | Status: DC
  Administered 2014-12-14 – 2014-12-15 (×2): 40 mg via ORAL
  Filled 2014-12-13 (×2): qty 2

## 2014-12-13 MED ORDER — INSULIN ASPART 100 UNIT/ML ~~LOC~~ SOLN
0.0000 [IU] | Freq: Three times a day (TID) | SUBCUTANEOUS | Status: DC
  Administered 2014-12-14: 7 [IU] via SUBCUTANEOUS
  Administered 2014-12-14 – 2014-12-15 (×2): 4 [IU] via SUBCUTANEOUS
  Administered 2014-12-15: 3 [IU] via SUBCUTANEOUS
  Administered 2014-12-15: 11 [IU] via SUBCUTANEOUS
  Administered 2014-12-16: 3 [IU] via SUBCUTANEOUS

## 2014-12-13 MED ORDER — METFORMIN HCL 500 MG PO TABS
500.0000 mg | ORAL_TABLET | Freq: Two times a day (BID) | ORAL | Status: DC
  Administered 2014-12-14: 500 mg via ORAL
  Filled 2014-12-13: qty 1

## 2014-12-13 MED ORDER — LISINOPRIL 5 MG PO TABS
5.0000 mg | ORAL_TABLET | Freq: Every day | ORAL | Status: DC
  Administered 2014-12-14: 5 mg via ORAL
  Filled 2014-12-13: qty 1

## 2014-12-13 MED ORDER — STROKE: EARLY STAGES OF RECOVERY BOOK
Freq: Once | Status: AC
  Administered 2014-12-14: 18:00:00
  Filled 2014-12-13: qty 1

## 2014-12-13 MED ORDER — DONEPEZIL HCL 5 MG PO TABS
10.0000 mg | ORAL_TABLET | Freq: Every day | ORAL | Status: DC
  Administered 2014-12-14 – 2014-12-16 (×3): 10 mg via ORAL
  Filled 2014-12-13 (×4): qty 2

## 2014-12-13 MED ORDER — IPRATROPIUM-ALBUTEROL 0.5-2.5 (3) MG/3ML IN SOLN
3.0000 mL | Freq: Four times a day (QID) | RESPIRATORY_TRACT | Status: DC
  Administered 2014-12-13 – 2014-12-14 (×2): 3 mL via RESPIRATORY_TRACT
  Filled 2014-12-13 (×2): qty 3

## 2014-12-13 MED ORDER — ALBUTEROL SULFATE (2.5 MG/3ML) 0.083% IN NEBU
2.5000 mg | INHALATION_SOLUTION | RESPIRATORY_TRACT | Status: DC | PRN
  Administered 2014-12-14: 2.5 mg via RESPIRATORY_TRACT
  Filled 2014-12-13: qty 3

## 2014-12-13 MED ORDER — ALBUTEROL (5 MG/ML) CONTINUOUS INHALATION SOLN
10.0000 mg/h | INHALATION_SOLUTION | Freq: Once | RESPIRATORY_TRACT | Status: AC
  Administered 2014-12-13: 10 mg/h via RESPIRATORY_TRACT
  Filled 2014-12-13: qty 20

## 2014-12-13 MED ORDER — LORAZEPAM 2 MG/ML IJ SOLN
1.0000 mg | Freq: Once | INTRAMUSCULAR | Status: AC
Start: 2014-12-13 — End: 2014-12-13
  Administered 2014-12-13: 1 mg via INTRAVENOUS
  Filled 2014-12-13: qty 1

## 2014-12-13 MED ORDER — DOCUSATE SODIUM 100 MG PO CAPS
100.0000 mg | ORAL_CAPSULE | Freq: Two times a day (BID) | ORAL | Status: DC
  Administered 2014-12-13 – 2014-12-15 (×5): 100 mg via ORAL
  Filled 2014-12-13 (×5): qty 1

## 2014-12-13 MED ORDER — NITROGLYCERIN 0.4 MG SL SUBL: 0.4000 mg | SUBLINGUAL_TABLET | SUBLINGUAL | Status: DC | PRN

## 2014-12-13 MED ORDER — CARVEDILOL 3.125 MG PO TABS
3.1250 mg | ORAL_TABLET | Freq: Two times a day (BID) | ORAL | Status: DC
  Administered 2014-12-14 (×2): 3.125 mg via ORAL
  Filled 2014-12-13 (×2): qty 1

## 2014-12-13 MED ORDER — CETYLPYRIDINIUM CHLORIDE 0.05 % MT LIQD
7.0000 mL | Freq: Two times a day (BID) | OROMUCOSAL | Status: DC
  Administered 2014-12-14 – 2014-12-16 (×5): 7 mL via OROMUCOSAL

## 2014-12-13 MED ORDER — ATORVASTATIN CALCIUM 40 MG PO TABS
80.0000 mg | ORAL_TABLET | Freq: Every day | ORAL | Status: DC
  Administered 2014-12-14 – 2014-12-16 (×3): 80 mg via ORAL
  Filled 2014-12-13 (×4): qty 2

## 2014-12-13 MED ORDER — GLIMEPIRIDE 2 MG PO TABS
2.0000 mg | ORAL_TABLET | Freq: Every day | ORAL | Status: DC
  Administered 2014-12-14 – 2014-12-15 (×2): 2 mg via ORAL
  Filled 2014-12-13 (×2): qty 1

## 2014-12-13 MED ORDER — INSULIN ASPART 100 UNIT/ML ~~LOC~~ SOLN
0.0000 [IU] | Freq: Every day | SUBCUTANEOUS | Status: DC
Start: 2014-12-13 — End: 2014-12-17
  Administered 2014-12-14: 2 [IU] via SUBCUTANEOUS

## 2014-12-13 NOTE — ED Notes (Signed)
Pt up to edge of bed - attempted to provide urine for sample - unsuccessful at this time

## 2014-12-13 NOTE — H&P (Signed)
History and Physical  George Cook ZOX:[id number] DOB: 1927/08/01 DOA: 12/13/2014  Referring physician: Dr Blinda Leatherwood, ED physician PCP: George Pizza, MD   Chief Complaint: Right arm weakness  HPI: George Cook is a 81 y.o. male  With a history of left-sided brain infarction 2010, pulmonary embolism on chronic anticoagulation with Eliquis, diabetes, carotid tenolysis bilaterally (approximately 40-50% in January 2016), hypertension, diabetes, obesity, grade 1 diastolic dysfunction with preserved EF on echocardiogram 03/16 with moderate to severe aortic stenosis. Patient had new onset of right hand weakness while watching television this morning. Symptoms resolved within an hour or so. The patient had accompanying numbness and tingling in the hand. This has also resolved. The patient arrived at the hospital approximately 11 AM for evaluation. During the episode, there was no speech difficulty, weakness or numbness of the right lower extremity, or new facial droop. Patient does have some residual difficulties in walking left over from his stroke in 2010.  Additionally, the patient has been more wheezy than normal over the past couple of days. He denies shortness of breath, although gets winded very easily with movement and with ambulation. His breathing is better at rest. No other provoking or palliating factors. He has never been diagnosed with COPD, although takes some breathing treatments twice a day. Patient is unsure as to what type of breathing treatments he takes.   Review of Systems:   Pt complains of wheezing, chronic cough.  Pt denies any fevers, chills, nausea, vomiting, abdominal pain, chest pain, palpitations, purulent or productive cough, headaches, blurred vision, weakness, diarrhea, constipation, melana.  Review of systems are otherwise negative  Past Medical History  Diagnosis Date  . HTN (hypertension)   . DM (diabetes mellitus)   . Carotid artery occlusion     a. 05/2012 U/S  Bilat ICA 20-39%.  . Obesity   . Stroke     a. 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 01/2009 TEE: EF 60-65%, mild MR, no LA/LAA thrombus.  . Macular degeneration   . Hiatal hernia   . Nephrolithiasis    Past Surgical History  Procedure Laterality Date  . Finger surgery Left     pinky, after saw cut it off  . Cataract extraction w/phaco Right 10/02/2012    Procedure: CATARACT EXTRACTION PHACO AND INTRAOCULAR LENS PLACEMENT (IOC);  Surgeon: Gemma Payor, MD;  Location: [facility];  Service: Ophthalmology;  Laterality: Right;  CDE 12.78  . Eye surgery    . Cataract extraction w/phaco Left 10/30/2012    Procedure: CATARACT EXTRACTION PHACO AND INTRAOCULAR LENS PLACEMENT (IOC);  Surgeon: Gemma Payor, MD;  Location: [facility];  Service: Ophthalmology;  Laterality: Left;  CDE: 19.10  . Left heart catheterization with coronary angiogram Bilateral 03/08/2013    Procedure: LEFT HEART CATHETERIZATION WITH CORONARY ANGIOGRAM;  Surgeon: Laurey Morale, MD;  Location: [facility] CATH LAB;  Service: Cardiovascular;  Laterality: Bilateral;   Social History:  reports that he quit smoking about 23 years ago. His smoking use included Cigarettes. He started smoking about 65 years ago. He has a 120 pack-year smoking history. He has never used smokeless tobacco. He reports that he does not drink alcohol or use illicit drugs. Patient lives at home & is able to participate in activities of daily living   Allergies  Allergen Reactions  . Aspirin Hives and Rash    Family History  Problem Relation Age of Onset  . Diabetes Mother   . Stroke Mother   . Emphysema Mother   .  Hypertension Mother   . Coronary artery disease Father   . Heart disease Father     Beefore age 43  . Stroke Sister   . Diabetes Sister   . Hypertension Sister   . Coronary artery disease Brother   . Heart disease Brother     Before age 69- CABG      Prior to Admission medications   Medication Sig Start Date End Date Taking? Authorizing Provider    apixaban (ELIQUIS) 5 MG TABS tablet Take 5 mg by mouth 2 (two) times daily.   Yes Historical Provider, MD  Ascorbic Acid (VITAMIN C) 1000 MG tablet Take 1,500 mg by mouth daily.    Yes Historical Provider, MD  atorvastatin (LIPITOR) 80 MG tablet Take 1 tablet (80 mg total) by mouth daily at 6 PM. Patient taking differently: Take 20 mg by mouth daily at 6 PM.  03/13/13  Yes Antoine Poche, MD  bumetanide (BUMEX) 2 MG tablet Take 1 mg by mouth daily. 1/2 tab daily   Yes Historical Provider, MD  carvedilol (COREG) 3.125 MG tablet TAKE 1 TABLET BY MOUTH TWICE DAILY WITH MEALS. 10/25/14  Yes Laqueta Linden, MD  Cholecalciferol (VITAMIN D) 2000 UNITS CAPS Take 1 capsule by mouth daily after supper.   Yes Historical Provider, MD  docusate sodium (COLACE) 100 MG capsule Take 100 mg by mouth 2 (two) times daily.   Yes Historical Provider, MD  donepezil (ARICEPT) 10 MG tablet Take 10 mg by mouth daily after supper.    Yes Historical Provider, MD  glimepiride (AMARYL) 2 MG tablet Take 2 mg by mouth daily before breakfast.     Yes Historical Provider, MD  lisinopril (PRINIVIL,ZESTRIL) 5 MG tablet Take 1 tablet (5 mg total) by mouth daily. 03/13/13  Yes Antoine Poche, MD  metFORMIN (GLUCOPHAGE) 500 MG tablet Take 500 mg by mouth 2 (two) times daily.  06/01/13  Yes Historical Provider, MD  Multiple Vitamin (MULTIVITAMIN WITH MINERALS) TABS tablet Take 1 tablet by mouth daily.   Yes Historical Provider, MD  nitroGLYCERIN (NITROSTAT) 0.4 MG SL tablet Place 1 tablet (0.4 mg total) under the tongue every 5 (five) minutes as needed for chest pain. 03/13/13  Yes Antoine Poche, MD    Physical Exam: BP 172/73 mmHg  Pulse 55  Temp(Src) 97.6 F (36.4 C) (Oral)  Resp 23  Ht 5' 9.5" (1.765 m)  Wt 90.719 kg (200 lb)  BMI 29.12 kg/m2  SpO2 100%  General: Older Caucasian male. Awake and alert and oriented x3. No acute cardiopulmonary distress.  Eyes: Pupils equal, round, reactive to light. Extraocular  muscles are intact. Sclerae anicteric and noninjected.  ENT:  Moist mucosal membranes. No mucosal lesions.  Neck: Neck supple without lymphadenopathy. No carotid bruits. No masses palpated.  Cardiovascular: Regular rate with normal S1-S2 sounds. Systolic ejection murmur heard best in the left upper sternal border, approximately 3/6.  Norubs, gallops auscultated. No JVD.  Respiratory: Diminished breath sounds, coarse wheezing with rhonchi that do not clear with cough. No rales auscultated. Prolonged exhalation phase.  Abdomen: Obese, soft, nontender, nondistended. Active bowel sounds. No masses or hepatosplenomegaly  Skin: Dry, warm to touch. 2+ dorsalis pedis and radial pulses. Musculoskeletal: No calf or leg pain. All major joints not erythematous nontender.  Psychiatric: Intact judgment and insight.  Neurologic: No focal neurological deficits. Cranial nerves II through XII are grossly intact.           Labs on Admission:  Basic Metabolic Panel:  Recent Labs Lab 12/13/14 1149  NA 139  K 4.3  CL 104  CO2 28  GLUCOSE 126*  BUN 21*  CREATININE 0.92  CALCIUM 9.4   Liver Function Tests:  Recent Labs Lab 12/13/14 1149  AST 22  ALT 15*  ALKPHOS 36*  BILITOT 0.6  PROT 6.5  ALBUMIN 3.7   No results for input(s): LIPASE, AMYLASE in the last 168 hours. No results for input(s): AMMONIA in the last 168 hours. CBC:  Recent Labs Lab 12/13/14 1149  WBC 6.3  NEUTROABS 4.4  HGB 12.2*  HCT 37.2*  MCV 84.2  PLT 183   Cardiac Enzymes:  Recent Labs Lab 12/13/14 1149  TROPONINI <0.03    BNP (last 3 results)  Recent Labs  12/13/14 1150  BNP 187.0*    ProBNP (last 3 results) No results for input(s): PROBNP in the last 8760 hours.  CBG: No results for input(s): GLUCAP in the last 168 hours.  Radiological Exams on Admission: Dg Chest 2 View  12/13/2014   CLINICAL DATA:  Shortness of breath.  EXAM: CHEST  2 VIEW  COMPARISON:  December 04, 2013.  FINDINGS: Stable  cardiomediastinal silhouette. No pneumothorax or pleural effusion is noted. Both lungs are clear. The visualized skeletal structures are unremarkable.  IMPRESSION: No active cardiopulmonary disease.   Electronically Signed   By: Lupita Raider, M.D.   On: 12/13/2014 12:34   Ct Head Wo Contrast  12/13/2014   CLINICAL DATA:  Right arm numbness beginning today.  EXAM: CT HEAD WITHOUT CONTRAST  TECHNIQUE: Contiguous axial images were obtained from the base of the skull through the vertex without intravenous contrast.  COMPARISON:  Head CT scan and brain MRI 02/22/2014.  FINDINGS: The brain is atrophic with extensive chronic microvascular ischemic change. Deep white matter infarction left frontal lobe is unchanged. Bilateral basal ganglia lacunar infarctions are also identified there is a remote lacunar infarction left cerebellum. No evidence of acute abnormality including hemorrhage, infarct, mass lesion, mass effect, midline shift or abnormal extra-axial fluid collection is identified. There is no hydrocephalus or pneumocephalus. The calvarium is intact.  IMPRESSION: No acute abnormality.  Atrophy and extensive chronic microvascular ischemic change.   Electronically Signed   By: Drusilla Kanner M.D.   On: 12/13/2014 12:19    EKG: Independently reviewed. Sinus rhythm with ventricular rate at 65. Prolonged PR interval at 0.292. QRS interval 0.153. Right bundle-branch block. No acute ST changes.  Assessment/Plan Present on Admission:  . TIA (transient ischemic attack) . COPD with acute exacerbation  This patient was discussed with the ED physician, including pertinent vitals, physical exam findings, labs, and imaging.  We also discussed care given by the ED provider.  #1 TIA  Admit to telemetry  Physical therapy, last patient therapy consults in the morning  MR/MRA ordered  As the patient has had an echocardiogram and carotid duplex in the past 6 months, do not feel that repeating these will add  to the workup of this patient  Check fasting lipid panel and hemoglobin A1c  Continue daily aspirin and anticoagulation  #2 COPD with acute exacerbation  Other the patient has not been diagnosed with COPD, his history of smoking and his symptoms are suggestive of COPD.  Prednisone burst  DuoNeb's every 6 hours  Albuterol every 2 when necessary  Do not feel that the patient warrants antibiotic therapy at this time due to nonproductive/purulent cough  #3 diabetes  As the patient is starting steroids, will place the patient  on sliding scale insulin  CBGs performed open at the time  DVT prophylaxis: Eliquis  Consultants: None  Code Status: DO NOT RESUSCITATE  Family Communication: Wife in the room   Disposition Plan: Home following observation   Levie Heritage, DO [facility] Pager [phone]

## 2014-12-13 NOTE — Progress Notes (Addendum)
Hypoglycemic Event  CBG: 45   Treatment: orange juice and graham cracker  Symptoms: N/a  Follow-up CBG: 2308  CBG Result: 145  Possible Reasons for Event: Pt has been NPO and hasnt eaten since admission. When pt was finally provided a tray most of his food had wasted in the bed.  Comments/MD notified: MD MADE AWARE    George Cook D  Remember to initiate Hypoglycemia Order Set & complete

## 2014-12-13 NOTE — Progress Notes (Signed)
Pt blood sugar 45. Pt is currently eating graham crackers and drinking orange juice will recheck in 15 minutes. Also, pt was in and out cath in the ED and has only voided 125 cc since then. Md is made aware. No new orders at this time.

## 2014-12-13 NOTE — ED Notes (Signed)
Patient c/o Right arm "feeling dead." Per patient noticed it when he woke this morning at 4am was fine at 9pm last night. Patient does report hx of stroke 10 years ago. Patient states "arm feels better then it did." Denies any slurred speech, headache, or facial drooping. Does report some dizziness upon standing.

## 2014-12-13 NOTE — ED Provider Notes (Signed)
CSN: [id number]   Arrival date & time 12/13/14 1103  History  This chart was scribed for  Gilda Crease, MD by Bethel Born, ED Scribe. This patient was seen in room APA10/APA10 and the patient's care was started at 11:22 AM.  Chief Complaint  Patient presents with  . Numbness    HPI The history is provided by the patient and the spouse. No language interpreter was used.   George Cook is a 81 y.o. male with PMHx of stroke in 2010, HTN, and DM who presents to the Emergency Department complaining of new right hand weakness with onset this morning while watching television. Pt denies pain, numbness or tingling in the hand. No speech difficulty, weakness or numbness in the RLE, or new facial droop. Pt has SOB but denies any increase noting that he is due for a breathing treatment. Former smoker.   Past Medical History  Diagnosis Date  . HTN (hypertension)   . DM (diabetes mellitus)   . Carotid artery occlusion     a. 05/2012 U/S Bilat ICA 20-39%.  . Obesity   . Stroke     a. 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 01/2009 TEE: EF 60-65%, mild MR, no LA/LAA thrombus.  . Macular degeneration   . Hiatal hernia   . Nephrolithiasis     Past Surgical History  Procedure Laterality Date  . Finger surgery Left     pinky, after saw cut it off  . Cataract extraction w/phaco Right 10/02/2012    Procedure: CATARACT EXTRACTION PHACO AND INTRAOCULAR LENS PLACEMENT (IOC);  Surgeon: Gemma Payor, MD;  Location: [facility];  Service: Ophthalmology;  Laterality: Right;  CDE 12.78  . Eye surgery    . Cataract extraction w/phaco Left 10/30/2012    Procedure: CATARACT EXTRACTION PHACO AND INTRAOCULAR LENS PLACEMENT (IOC);  Surgeon: Gemma Payor, MD;  Location: [facility];  Service: Ophthalmology;  Laterality: Left;  CDE: 19.10  . Left heart catheterization with coronary angiogram Bilateral 03/08/2013    Procedure: LEFT HEART CATHETERIZATION WITH CORONARY ANGIOGRAM;  Surgeon: Laurey Morale, MD;  Location:  [facility] CATH LAB;  Service: Cardiovascular;  Laterality: Bilateral;    Family History  Problem Relation Age of Onset  . Diabetes Mother   . Stroke Mother   . Emphysema Mother   . Hypertension Mother   . Coronary artery disease Father   . Heart disease Father     Beefore age 64  . Stroke Sister   . Diabetes Sister   . Hypertension Sister   . Coronary artery disease Brother   . Heart disease Brother     Before age 52- CABG    History  Substance Use Topics  . Smoking status: Former Smoker -- 3.00 packs/day for 40 years    Types: Cigarettes    Start date: 05/24/1949    Quit date: 05/25/1991  . Smokeless tobacco: Never Used  . Alcohol Use: No     Review of Systems  Respiratory: Positive for shortness of breath.   Neurological: Positive for focal weakness (Right hand). Negative for tingling, sensory change and speech change.  All other systems reviewed and are negative.   Home Medications   Prior to Admission medications   Medication Sig Start Date End Date Taking? Authorizing Provider  apixaban (ELIQUIS) 5 MG TABS tablet Take 5 mg by mouth 2 (two) times daily.   Yes Historical Provider, MD  Ascorbic Acid (VITAMIN C) 1000 MG tablet Take 1,500 mg by mouth daily.  Yes Historical Provider, MD  atorvastatin (LIPITOR) 80 MG tablet Take 1 tablet (80 mg total) by mouth daily at 6 PM. Patient taking differently: Take 20 mg by mouth daily at 6 PM.  03/13/13  Yes Antoine Poche, MD  bumetanide (BUMEX) 2 MG tablet Take 1 mg by mouth daily. 1/2 tab daily   Yes Historical Provider, MD  carvedilol (COREG) 3.125 MG tablet TAKE 1 TABLET BY MOUTH TWICE DAILY WITH MEALS. 10/25/14  Yes Laqueta Linden, MD  Cholecalciferol (VITAMIN D) 2000 UNITS CAPS Take 1 capsule by mouth daily after supper.   Yes Historical Provider, MD  docusate sodium (COLACE) 100 MG capsule Take 100 mg by mouth 2 (two) times daily.   Yes Historical Provider, MD  donepezil (ARICEPT) 10 MG tablet Take 10 mg by mouth daily  after supper.    Yes Historical Provider, MD  glimepiride (AMARYL) 2 MG tablet Take 2 mg by mouth daily before breakfast.     Yes Historical Provider, MD  lisinopril (PRINIVIL,ZESTRIL) 5 MG tablet Take 1 tablet (5 mg total) by mouth daily. 03/13/13  Yes Antoine Poche, MD  metFORMIN (GLUCOPHAGE) 500 MG tablet Take 500 mg by mouth 2 (two) times daily.  06/01/13  Yes Historical Provider, MD  Multiple Vitamin (MULTIVITAMIN WITH MINERALS) TABS tablet Take 1 tablet by mouth daily.   Yes Historical Provider, MD  nitroGLYCERIN (NITROSTAT) 0.4 MG SL tablet Place 1 tablet (0.4 mg total) under the tongue every 5 (five) minutes as needed for chest pain. 03/13/13  Yes Antoine Poche, MD    Allergies  Aspirin  Triage Vitals: BP 140/70 mmHg  Temp(Src) 97.9 F (36.6 C) (Oral)  Resp 20  Ht 5' 9.5" (1.765 m)  Wt 200 lb (90.719 kg)  BMI 29.12 kg/m2  SpO2 99%  Physical Exam  Constitutional: He is oriented to person, place, and time. He appears well-developed and well-nourished. No distress.  HENT:  Head: Normocephalic and atraumatic.  Right Ear: Hearing normal.  Left Ear: Hearing normal.  Nose: Nose normal.  Mouth/Throat: Oropharynx is clear and moist and mucous membranes are normal.  Eyes: Conjunctivae and EOM are normal. Pupils are equal, round, and reactive to light.  Neck: Normal range of motion. Neck supple.  Cardiovascular: Regular rhythm, S1 normal and S2 normal.  Exam reveals no gallop and no friction rub.   No murmur heard. Pulmonary/Chest: Effort normal. Tachypnea noted. No respiratory distress. He has decreased breath sounds. He has wheezes. He exhibits no tenderness.  Abdominal: Soft. Normal appearance and bowel sounds are normal. There is no hepatosplenomegaly. There is no tenderness. There is no rebound, no guarding, no tenderness at McBurney's point and negative Murphy's sign. No hernia.  Musculoskeletal: Normal range of motion.  Neurological: He is alert and oriented to person,  place, and time. He has normal strength. No cranial nerve deficit or sensory deficit. Coordination normal. GCS eye subscore is 4. GCS verbal subscore is 5. GCS motor subscore is 6.  Extraocular muscle movement: normal No visual field cut Pupils: equal and reactive both direct and consensual response is normal No nystagmus present    Sensory function is intact to light touch, pinprick Proprioception intact  Grip strength 5/5 symmetric in upper extremities No pronator drift Normal finger to nose bilaterally  Lower extremity strength 5/5 against gravity Normal heel to shin bilaterally     Skin: Skin is warm, dry and intact. No rash noted. No cyanosis.  Psychiatric: He has a normal mood and affect. His  speech is normal and behavior is normal. Thought content normal.  Nursing note and vitals reviewed.   ED Course  Procedures   DIAGNOSTIC STUDIES: Oxygen Saturation is 99% on RA, normal by my interpretation.    COORDINATION OF CARE: 11:29 AM Discussed treatment plan which includes CT head without contrast, CXR, EKG, and lab work  with pt at bedside and pt agreed to plan.  Labs Review-  Labs Reviewed  PROTIME-INR - Abnormal; Notable for the following:    Prothrombin Time 17.0 (*)    All other components within normal limits  CBC - Abnormal; Notable for the following:    Hemoglobin 12.2 (*)    HCT 37.2 (*)    All other components within normal limits  COMPREHENSIVE METABOLIC PANEL - Abnormal; Notable for the following:    Glucose, Bld 126 (*)    BUN 21 (*)    ALT 15 (*)    Alkaline Phosphatase 36 (*)    All other components within normal limits  URINALYSIS, ROUTINE W REFLEX MICROSCOPIC (NOT AT [facility]) - Abnormal; Notable for the following:    Specific Gravity, Urine >1.030 (*)    All other components within normal limits  BRAIN NATRIURETIC PEPTIDE - Abnormal; Notable for the following:    B Natriuretic Peptide 187.0 (*)    All other components within normal limits  ETHANOL   APTT  DIFFERENTIAL  TROPONIN I  URINE RAPID DRUG SCREEN, HOSP PERFORMED  BLOOD GAS, ARTERIAL    Imaging Review Dg Chest 2 View  12/13/2014   CLINICAL DATA:  Shortness of breath.  EXAM: CHEST  2 VIEW  COMPARISON:  December 04, 2013.  FINDINGS: Stable cardiomediastinal silhouette. No pneumothorax or pleural effusion is noted. Both lungs are clear. The visualized skeletal structures are unremarkable.  IMPRESSION: No active cardiopulmonary disease.   Electronically Signed   By: Lupita Raider, M.D.   On: 12/13/2014 12:34   Ct Head Wo Contrast  12/13/2014   CLINICAL DATA:  Right arm numbness beginning today.  EXAM: CT HEAD WITHOUT CONTRAST  TECHNIQUE: Contiguous axial images were obtained from the base of the skull through the vertex without intravenous contrast.  COMPARISON:  Head CT scan and brain MRI 02/22/2014.  FINDINGS: The brain is atrophic with extensive chronic microvascular ischemic change. Deep white matter infarction left frontal lobe is unchanged. Bilateral basal ganglia lacunar infarctions are also identified there is a remote lacunar infarction left cerebellum. No evidence of acute abnormality including hemorrhage, infarct, mass lesion, mass effect, midline shift or abnormal extra-axial fluid collection is identified. There is no hydrocephalus or pneumocephalus. The calvarium is intact.  IMPRESSION: No acute abnormality.  Atrophy and extensive chronic microvascular ischemic change.   Electronically Signed   By: Drusilla Kanner M.D.   On: 12/13/2014 12:19    EKG Interpretation  Date/Time:  Friday December 13 2014 11:16:05 EDT Ventricular Rate:  65 PR Interval:  292 QRS Duration: 153 QT Interval:  428 QTC Calculation: 445 R Axis:   84 Text Interpretation:  Sinus rhythm Ventricular premature complex Prolonged PR interval Right bundle branch block Baseline wander in lead(s) V5 No significant change since last tracing Confirmed by POLLINA  MD, CHRISTOPHER [phone]) on 12/13/2014 11:21:21  AM       MDM   Final diagnoses:  Shortness of breath  Transient cerebral ischemia, unspecified transient cerebral ischemia type   Patient presents to the emergency department for evaluation of right arm numbness and weakness. Patient reports that he woke up this  morning and was having trouble using his right arm. It was numb and tingly and weak, had trouble grasping objects. He reports that the symptoms progressively improved over time. These symptoms have completely resolved in the ER. CT head unremarkable, symptoms are likely consistent with TIA. Reviewing the patient's records reveals that he does have a history of carotid stenosis, but only 40% on the right and 40-59% on the left. He also had echo performed in March of this year, EF 55-60%, moderate to severe aortic stenosis.  Patient noted to be dyspneic here in the ER. He is feeling better with supplemental oxygen and saturations are normal. His respiratory rate is still somewhat increased. He does have a history of PE as well as what appears to be congestive heart failure, on Bumex. In addition to this, patient reports that he takes breathing treatments twice a day. Examination reveals congestion and wheezing, likely the source of his dyspnea. Blood gas ordered, will initiate continuous nebulizer treatment. Patient to be admitted for further management.  I personally performed the services described in this documentation, which was scribed in my presence. The recorded information has been reviewed and is accurate.      Gilda Crease, MD 12/13/14 [phone]

## 2014-12-14 DIAGNOSIS — Z86711 Personal history of pulmonary embolism: Secondary | ICD-10-CM | POA: Diagnosis not present

## 2014-12-14 DIAGNOSIS — I10 Essential (primary) hypertension: Secondary | ICD-10-CM | POA: Diagnosis present

## 2014-12-14 DIAGNOSIS — E119 Type 2 diabetes mellitus without complications: Secondary | ICD-10-CM | POA: Diagnosis not present

## 2014-12-14 DIAGNOSIS — I340 Nonrheumatic mitral (valve) insufficiency: Secondary | ICD-10-CM | POA: Diagnosis not present

## 2014-12-14 DIAGNOSIS — I350 Nonrheumatic aortic (valve) stenosis: Secondary | ICD-10-CM | POA: Diagnosis present

## 2014-12-14 DIAGNOSIS — Z825 Family history of asthma and other chronic lower respiratory diseases: Secondary | ICD-10-CM | POA: Diagnosis not present

## 2014-12-14 DIAGNOSIS — Z87891 Personal history of nicotine dependence: Secondary | ICD-10-CM | POA: Diagnosis not present

## 2014-12-14 DIAGNOSIS — R531 Weakness: Secondary | ICD-10-CM | POA: Diagnosis present

## 2014-12-14 DIAGNOSIS — I639 Cerebral infarction, unspecified: Secondary | ICD-10-CM

## 2014-12-14 DIAGNOSIS — Z833 Family history of diabetes mellitus: Secondary | ICD-10-CM | POA: Diagnosis not present

## 2014-12-14 DIAGNOSIS — J441 Chronic obstructive pulmonary disease with (acute) exacerbation: Secondary | ICD-10-CM

## 2014-12-14 DIAGNOSIS — H3530 Unspecified macular degeneration: Secondary | ICD-10-CM | POA: Diagnosis present

## 2014-12-14 DIAGNOSIS — Z7901 Long term (current) use of anticoagulants: Secondary | ICD-10-CM | POA: Diagnosis not present

## 2014-12-14 DIAGNOSIS — Z8249 Family history of ischemic heart disease and other diseases of the circulatory system: Secondary | ICD-10-CM | POA: Diagnosis not present

## 2014-12-14 DIAGNOSIS — Z8673 Personal history of transient ischemic attack (TIA), and cerebral infarction without residual deficits: Secondary | ICD-10-CM | POA: Diagnosis not present

## 2014-12-14 DIAGNOSIS — Z823 Family history of stroke: Secondary | ICD-10-CM | POA: Diagnosis not present

## 2014-12-14 DIAGNOSIS — Z66 Do not resuscitate: Secondary | ICD-10-CM | POA: Diagnosis present

## 2014-12-14 LAB — GLUCOSE, CAPILLARY
GLUCOSE-CAPILLARY: 223 mg/dL — AB (ref 65–99)
Glucose-Capillary: 120 mg/dL — ABNORMAL HIGH (ref 65–99)
Glucose-Capillary: 186 mg/dL — ABNORMAL HIGH (ref 65–99)
Glucose-Capillary: 205 mg/dL — ABNORMAL HIGH (ref 65–99)

## 2014-12-14 LAB — LIPID PANEL
Cholesterol: 77 mg/dL (ref 0–200)
HDL: 30 mg/dL — ABNORMAL LOW (ref >40)
LDL CALC: 23 mg/dL (ref 0–99)
TRIGLYCERIDES: 122 mg/dL (ref <150)
Total CHOL/HDL Ratio: 2.6 RATIO
VLDL: 24 mg/dL (ref 0–40)

## 2014-12-14 MED ORDER — IPRATROPIUM-ALBUTEROL 0.5-2.5 (3) MG/3ML IN SOLN
3.0000 mL | Freq: Four times a day (QID) | RESPIRATORY_TRACT | Status: DC
  Administered 2014-12-14 – 2014-12-16 (×8): 3 mL via RESPIRATORY_TRACT
  Filled 2014-12-14 (×9): qty 3

## 2014-12-14 MED ORDER — IPRATROPIUM-ALBUTEROL 0.5-2.5 (3) MG/3ML IN SOLN: 3.0000 mL | Freq: Three times a day (TID) | RESPIRATORY_TRACT | Status: DC

## 2014-12-14 MED ORDER — IPRATROPIUM-ALBUTEROL 0.5-2.5 (3) MG/3ML IN SOLN: 3.0000 mL | Freq: Two times a day (BID) | RESPIRATORY_TRACT | Status: DC

## 2014-12-14 NOTE — Evaluation (Signed)
Physical Therapy Evaluation Patient Details Name: George Cook MRN: [id number] DOB: 1927-12-24 Today's Date: 12/14/2014   History of Present Illness  With a history of left-sided brain infarction 2010, pulmonary embolism on chronic anticoagulation with Eliquis, diabetes, carotid tenolysis bilaterally (approximately 40-50% in January 2016), hypertension, diabetes, obesity, grade 1 diastolic dysfunction with preserved EF on echocardiogram 03/16 with moderate to severe aortic stenosis. Patient had new onset of right hand weakness while watching television this morning. Symptoms resolved within an hour or so. The patient had accompanying numbness and tingling in the hand. This has also resolved. The patient arrived at the hospital approximately 11 AM for evaluation. During the episode, there was no speech difficulty, weakness or numbness of the right lower extremity, or new facial droop. Patient does have some residual difficulties in walking left over from his stroke in 2010.  Clinical Impression  RN reported that patient is OK to be seen this morning. Patient found sidelying in bed, very pleasant and reports that he does need a little bit of help with mobility at home, was really only walking household distances and has fallen recently. Able to complete supine to sit and sit to stand with Min(A); gait approximately 70ft with Min guard and unsteadiness during gait. During gait patient stated that he needed to urinate and then began to urinate on floor and in bathroom until PT guided him to toilet; patient very unsafe while attempting to manage urine and navigate to toilet despite PT cues. Family reports that incontinence is a very large factor that has contributed to falls at home, as patient will need to urinate and start rushing and fall. RN entered room and was informed about incontinence situation, reported that patient was supposed to have condom cath (which was not on patient when PT entered room),  however family states that in the past condom caths have not worked well to resolve incontinence situation. Patient and family report that he is  Generally at his baseline level of  Function, however at this time recommend HHPT if able (as patient was just DCed from HHPT); at minimum recommend possible aide to assist at home. Patient will continue to be seen by skilled PT services while in this facility with a focus on balance, safety, and gait. Vitals remained WNL per pulse ox during session.     Follow Up Recommendations Home health PT;Other (comment) (or possible aide to assist with care at home )    Equipment Recommendations  None recommended by PT    Recommendations for Other Services OT consult     Precautions / Restrictions Precautions Precautions: Fall;Other (comment) Precaution Comments: residual effects CVA  Restrictions Weight Bearing Restrictions: No      Mobility  Bed Mobility Overal bed mobility: Needs Assistance Bed Mobility: Supine to Sit;Sit to Supine     Supine to sit: Min assist Sit to supine: Supervision   General bed mobility comments: scooting with supervision   Transfers Overall transfer level: Needs assistance Equipment used: Rolling walker (2 wheeled) Transfers: Sit to/from Stand Sit to Stand: Min assist         General transfer comment: poor safety awareness   Ambulation/Gait Ambulation/Gait assistance: Min guard Ambulation Distance (Feet): 40 Feet Assistive device: Rolling walker (2 wheeled) Gait Pattern/deviations: Step-through pattern;Decreased step length - right;Decreased step length - left;Decreased dorsiflexion - right;Decreased dorsiflexion - left;Drifts right/left;Trunk flexed;Narrow base of support   Gait velocity interpretation: Below normal speed for age/gender    Stairs  Wheelchair Mobility    Modified Rankin (Stroke Patients Only)       Balance Overall balance assessment: History of Falls;Needs  assistance Sitting-balance support: No upper extremity supported Sitting balance-Leahy Scale: Fair     Standing balance support: Bilateral upper extremity supported Standing balance-Leahy Scale: Fair                 High Level Balance Comments: unsteadiness with gait              Pertinent Vitals/Pain Pain Assessment: No/denies pain    Home Living Family/patient expects to be discharged to:: Private residence Living Arrangements: Spouse/significant other Available Help at Discharge: Family;Available 24 hours/day Type of Home: House Home Access: Stairs to enter   [employer] of Steps: 1 Home Layout: One level Home Equipment: Walker - standard;Shower seat;Grab bars - toilet;Grab bars - tub/shower      Prior Function Level of Independence: Needs assistance   Gait / Transfers Assistance Needed: patient and famly report that he does need a little bit of help with transfers and mobility            Hand Dominance        Extremity/Trunk Assessment   Upper Extremity Assessment: Defer to OT evaluation           Lower Extremity Assessment: Generalized weakness      Cervical / Trunk Assessment: Kyphotic  Communication   Communication: No difficulties  Cognition Arousal/Alertness: Awake/alert Behavior During Therapy: WFL for tasks assessed/performed Overall Cognitive Status: Within Functional Limits for tasks assessed                      General Comments General comments (skin integrity, edema, etc.): old diabetic wounds noted on bialteral shins however appear to be healing without need for specific wound care at this time     Exercises        Assessment/Plan    PT Assessment Patient needs continued PT services  PT Diagnosis Difficulty walking;Abnormality of gait;Generalized weakness   PT Problem List Decreased strength;Decreased coordination;Decreased activity tolerance;Decreased knowledge of use of DME;Decreased  balance;Decreased safety awareness;Decreased mobility  PT Treatment Interventions DME instruction;Therapeutic exercise;Gait training;Balance training;Stair training;Neuromuscular re-education;Functional mobility training;Therapeutic activities;Patient/family education   PT Goals (Current goals can be found in the Care Plan section) Acute Rehab PT Goals Patient Stated Goal: to go home, reduce falls  PT Goal Formulation: With patient/family Time For Goal Achievement: 12/28/14 Potential to Achieve Goals: Fair    Frequency Min 3X/week   Barriers to discharge        Co-evaluation               End of Session Equipment Utilized During Treatment: Gait belt Activity Tolerance: Patient tolerated treatment well Patient left: in bed;with call bell/phone within reach;with nursing/sitter in room;with family/visitor present Nurse Communication: Other (comment) (incontinence )    Functional Assessment Tool Used: Based on skilled clinical observation of mobilty, gait, balance  Functional Limitation: Mobility: Walking and moving around Mobility: Walking and Moving Around Current Status [phone]): At least 20 percent but less than 40 percent impaired, limited or restricted Mobility: Walking and Moving Around Goal Status [phone]): At least 1 percent but less than 20 percent impaired, limited or restricted    Time: 1034-1100 PT Time Calculation (min) (ACUTE ONLY): 26 min   Charges:         PT G Codes:   PT G-Codes **NOT FOR INPATIENT CLASS** Functional Assessment Tool Used: Based on skilled  clinical observation of mobilty, gait, balance  Functional Limitation: Mobility: Walking and moving around Mobility: Walking and Moving Around Current Status [phone]): At least 20 percent but less than 40 percent impaired, limited or restricted Mobility: Walking and Moving Around Goal Status [phone]): At least 1 percent but less than 20 percent impaired, limited or restricted    Nedra Hai PT,  DPT [phone]

## 2014-12-14 NOTE — Progress Notes (Addendum)
PROGRESS NOTE  George BISCHOF BJY:[id number] DOB: [birth year] DOA: 12/13/2014 PCP: Catalina Pizza, MD  Summary: 86yom presented to ED with h/o RUE weakness and numbness. Admitted for suspected TIA and COPD exacerbation.  Assessment/Plan: 1. Bilateral parietal stroke, non-hemorrhagic. Pt to receive PT with the last consult this morning.  Will not repeat ECHO and carotid duplex at this time, as pt had both within the past 6 months. Echo from March with normal LVEF grade 1 diastolic dysfunction. Bilateral carotid ultrasound from Jan 2016 with mild to moderate ICA stenosis. Fasting lipid panel and hemoglobin A1c on 12/14/14.. Will continue daily aspirin and sapixaban. 2. COPD with acute exacerbation. No formal diagnosis of COPD, but with his history of smoking and his symptoms are suggestive of COPD. Will continue Prednisone burst, DuoNeb's every 6 hours, and Albuterol every 2 hours as necessary. Antibiotic therapy not initiated at this time. Improving, will continue steroids 3. Diabetes Mellitus. One episode hypogylcemia resolved.  4. Moderate to severe aortic stenosis. Asymptomatic.   Overall improved RUE strength overwise unremarkable.   Continue Apixaban and statin  Continue DuoNeb's every 6 hours and Albuterol every 2 as needed   Will wean oxygen.  Continue risk fact reduction  Resume blood pressure medications tomorrow.  Will discuss with neurology for further guidance.  Code Status: DNR  DVT prophylaxis:  Apixaban Family Communication: Wife and brother at bedside  Disposition Plan: Home once improved via observation   [name], MD  [facility]  Pager [phone] If 7PM-7AM, please contact night-coverage at [email or web address], password [facility] 12/14/2014, 7:22 AM    Consultants:  PT   Neurology  Procedures:  MRI HEAD Impression 12/13/14:   Small acute nonhemorrhagic infarcts anterior left parietal lobe and posterior left periventricular region.  Small acute  nonhemorrhagic infarct right parietal lobe.  Remote infarcts left frontal -parietal parasagittal position. Remote infarcts basal ganglia bilaterally and right thalamus. Remote right corona radiata infarcts. Remote left cerebellar infarct.  Prominent small vessel disease type changes.  Small region of blood breakdown products inferior medial left cerebellum most consistent with result of prior hemorrhagic ischemia.  Global atrophy. Ventricular prominence has progressed over time and may reflect central atrophy although there may also be a component of superimposed mild hydrocephalus.   MRA HEAD Impression 12/13/14:  Intracranial atherosclerotic type changes as detailed above most notable involving branch vessels.  Antibiotics:    HPI/Subjective: Pt reports feels better and was able to sleep. Denies pain, nausea, vomiting, and baseline breathing. He reports what brought him into the ED was losing control of his right arm, but not legs. He denies aphagia, trouble swallowing and vision changes. His hand is currently feeling better but slightly numb, was able to eat using right hand but strength is not at baseline.     Objective: Filed Vitals:   12/13/14 2104 12/14/14 0000 12/14/14 0200 12/14/14 0400  BP: 158/65 149/59  155/62  Pulse: 89 60  60  Temp: 98.7 F (37.1 C) 97.6 F (36.4 C)  97.8 F (36.6 C)  TempSrc: Axillary Oral  Oral  Resp: 20   19  Height:      Weight:      SpO2: 97% 99% 86% 92%    Intake/Output Summary (Last 24 hours) at 12/14/14 0722 Last data filed at 12/14/14 0510  Gross per 24 hour  Intake      0 ml  Output    575 ml  Net   -575 ml     Filed Weights   12/13/14  1116 12/13/14 1745  Weight: 90.719 kg (200 lb) 92.534 kg (204 lb)    Exam:     Afebrile, VSS General:  Appears calm and comfortable Eyes: PERRL, normal lids, irises & conjunctiva ENT: grossly normal hearing, lips & tongue Neck: no LAD, masses or thyromegaly Cardiovascular:  Regular rate,  2/6 systolic murmur, but no rub or gallop. 1+ bilateral edema, right greater than left. Telemetry: SR, 5 vtach   Respiratory: Faint bilateral wheeze, no rhonchi or rales  Mild increase respiratory effort able to speak in clear sentences.  Abdomen: soft, ntnd Skin: grossly unremarkable Musculoskeletal: Grip strength LUE 5/5 and RUE 4/5 Psychiatric: grossly normal mood and affect, speech fluent and appropriate Neurologic: cranial nerves intact, no pass pointing, sensation grossly intact.  New data reviewed:  MRI/MRA Head reviewed  Capillary Glucose 53 on admission >>145 on 12/13/2014  Arterial p02, 126  Bicarbonate, 24.3    Chest Xray independently reviewed, NAD.  CT Head independently reviewed, NAD.  Hypoglycemia overnight resolved.  BMP, CBC, ABG, CMP all unremarkable.  LDL 23  EKG independently rev SR, PVC, first AVB, RBBB, nscslt 02/22/14   Pertinent data since admission: Pt's blood sugar was at 45 overnight. Nurse provided him with graham crackers and orange juice, blood sugar increased to 145. Pt also had 5 beat run of VTACH last night.   Pending data:    Scheduled Meds: .  stroke: mapping our early stages of recovery book   Does not apply Once  . antiseptic oral rinse  7 mL Mouth Rinse BID  . apixaban  5 mg Oral BID  . atorvastatin  80 mg Oral q1800  . bumetanide  1 mg Oral Daily  . carvedilol  3.125 mg Oral BID WC  . docusate sodium  100 mg Oral BID  . donepezil  10 mg Oral QPC supper  . glimepiride  2 mg Oral QAC breakfast  . insulin aspart  0-20 Units Subcutaneous TID WC  . insulin aspart  0-5 Units Subcutaneous QHS  . ipratropium-albuterol  3 mL Nebulization Q6H  . lisinopril  5 mg Oral Daily  . metFORMIN  500 mg Oral BID WC  . predniSONE  40 mg Oral Q breakfast   Continuous Infusions:   Principal Problem:   CVA (cerebral infarction) Active Problems:   Type 2 diabetes mellitus   COPD with acute exacerbation   Time spent 35  minutes  I, Norg'e Tisdol, am scribing for Dr. Melton Alar. Ella Golomb on 12/14/2014 at 7:14 AM.   I have reviewed the above documentation for accuracy and completeness, and I agree with the above. [name], MD

## 2014-12-14 NOTE — Progress Notes (Signed)
LATE ENTRY FOR 7/22  Noted that patient had both bed rest and up with tolerance orders. Called RN, who paged MD and reported that patient would be OK to see in the morning of 7/23. Will check back in with RN before seeing patient due to recent [facility].  Nedra Hai PT, DPT [phone]

## 2014-12-14 NOTE — Progress Notes (Signed)
Patient concerned over his eye glasses. He did not have them on when RT entered room.

## 2014-12-14 NOTE — Progress Notes (Signed)
Pt had a 5 beat run of VTACH. MD made aware.

## 2014-12-14 NOTE — Plan of Care (Signed)
Problem: Acute Treatment Outcomes Goal: Other Acute Treatment Outcomes Outcome: Completed/Met Date Met:  12/14/14 BS monitored ac/hs

## 2014-12-15 LAB — GLUCOSE, CAPILLARY
GLUCOSE-CAPILLARY: 293 mg/dL — AB (ref 65–99)
GLUCOSE-CAPILLARY: 145 mg/dL — AB (ref 65–99)
Glucose-Capillary: 134 mg/dL — ABNORMAL HIGH (ref 65–99)
Glucose-Capillary: 198 mg/dL — ABNORMAL HIGH (ref 65–99)

## 2014-12-15 NOTE — Plan of Care (Signed)
Problem: Progression Outcomes Goal: Progressive activity as tolerated Outcome: Completed/Met Date Met:  12/15/14 Up to chair with assist Goal: Pain controlled Outcome: Completed/Met Date Met:  12/15/14 denies Goal: Bowel & Bladder Continence Outcome: Not Progressing incontenence bladder

## 2014-12-15 NOTE — Progress Notes (Addendum)
PROGRESS NOTE  George Cook GNF:[id number] DOB: August 21, 1927 DOA: 12/13/2014 PCP: Catalina Pizza, MD  Summary: 86yom presented to ED with h/o RUE weakness and numbness. Admitted for suspected TIA and COPD exacerbation.  Assessment/Plan: 1. Bilateral parietal stroke, non-hemorrhagic. Much improved. RUE deficit appears resolved, no new deficits. Echo from March with normal LVEF grade 1 diastolic dysfunction. Bilateral carotid ultrasound from Jan 2016 with mild to moderate ICA stenosis. Continue apixaban.  2. COPD with acute exacerbation. Better. No formal diagnosis of COPD, but with his history of smoking and his symptoms are suggestive of COPD. Will continue Prednisonet, nebs. Antibiotic therapy not indicated.  3. Diabetes Mellitus. Stable, no recurrent hypoglycemia.  4. Moderate to severe aortic stenosis. Asymptomatic. 5. H/o PE and DVT on apixaban   Overall appears better today   Discussed with Dr. Amada Jupiter, neurology at [facility] who reviewed films. Recommends continue apixaban (no change).Etiology likely cardiac emboli, recommends TEE.  Continue steroid, bronchodilators  Wean oxygen   PT saw pt on 7/23 and recommended Home Health PT or possible aide to assist with care at home. Pt placed on fall precautions.   Code Status: DNR  DVT prophylaxis:  Apixaban Family Communication: Wife at bedside, updated on plan 12/15/14  Disposition Plan: Home in the next 24 hours   [name], MD  [facility]  Pager [phone] If 7PM-7AM, please contact night-coverage at [email or web address], password [facility] 12/15/2014, 12:19 PM  LOS: 1 day   Consultants:  PT, Home Health PT or assisted care at home.    Neurology  Procedures:  MRI HEAD Impression 12/13/14:   Small acute nonhemorrhagic infarcts anterior left parietal lobe and posterior left periventricular region.  Small acute nonhemorrhagic infarct right parietal lobe.  Remote infarcts left frontal -parietal parasagittal position.  Remote infarcts basal ganglia bilaterally and right thalamus. Remote right corona radiata infarcts. Remote left cerebellar infarct.  Prominent small vessel disease type changes.  Small region of blood breakdown products inferior medial left cerebellum most consistent with result of prior hemorrhagic ischemia.  Global atrophy. Ventricular prominence has progressed over time and may reflect central atrophy although there may also be a component of superimposed mild hydrocephalus.   MRA HEAD Impression 12/13/14:  Intracranial atherosclerotic type changes as detailed above most notable involving branch vessels.  Antibiotics:    HPI/Subjective: He reports feeling a little better today, he was able to eat all off his breakfast and lunch. He was not able to sleep last night due to the noise on the floor. His breathing has improved with the breathing treatments and the cough has improved as well. His right arm has improved as well, he is able to use it well with eating and use it without difficulty.     Objective: Filed Vitals:   12/15/14 0000 12/15/14 0400 12/15/14 0756 12/15/14 1101  BP: 153/71 147/81 168/77   Pulse: 69 65 68   Temp: 98.2 F (36.8 C) 97.8 F (36.6 C) 97.5 F (36.4 C)   TempSrc: Oral Oral Oral   Resp: Height:      Weight:      SpO2: 96% 100% 100% 98%    Intake/Output Summary (Last 24 hours) at 12/15/14 1219 Last data filed at 12/15/14 0900  Gross per 24 hour  Intake    960 ml  Output      0 ml  Net    960 ml     Filed Weights   12/13/14 1116 12/13/14 1745  Weight: 90.719 kg (200  lb) 92.534 kg (204 lb)    Exam:     Afebrile, VSS General:  Appears comfortable, calm. Eyes: PERRL, normal lids, irises ENT: grossly normal hearing, lips, tongue Cardiovascular: Regular rate and rhythm, 2/6 hollow systolic murmur, but no rub or gallop. No lower extremity edema. Respiratory: Generally clear with faint wheezes, but no rales or rhonchi.  Mild increased respiratory effort, but able to speak in full sentences. Musculoskeletal: grossly normal tone bilateral upper and lower extremities, the RUE matches the LUE motor strength at 5/5. Sensation intact.  Psychiatric: grossly normal mood and affect, speech fluent, and appropriate Neurologic: CNs intact. No pronator drift or pass pointing.  New data reviewed:  CBGs stable   Pending data:    Scheduled Meds: . antiseptic oral rinse  7 mL Mouth Rinse BID  . apixaban  5 mg Oral BID  . atorvastatin  80 mg Oral q1800  . bumetanide  1 mg Oral Daily  . docusate sodium  100 mg Oral BID  . donepezil  10 mg Oral QPC supper  . glimepiride  2 mg Oral QAC breakfast  . insulin aspart  0-20 Units Subcutaneous TID WC  . insulin aspart  0-5 Units Subcutaneous QHS  . ipratropium-albuterol  3 mL Nebulization QID  . predniSONE  40 mg Oral Q breakfast   Continuous Infusions:   Principal Problem:   CVA (cerebral infarction) Active Problems:   Type 2 diabetes mellitus   COPD with acute exacerbation   Acute CVA (cerebrovascular accident)   Time spent 25 minutes  I, Norg'e Tisdol, am scribing for Dr. Melton Alar. Irene Limbo on 12/15/2014 at 12:19 PM.   I have reviewed the above documentation for accuracy and completeness, and I agree with the above. 12/15/2014 [name], MD

## 2014-12-16 ENCOUNTER — Encounter (HOSPITAL_COMMUNITY)
Admission: EM | Disposition: A | Discharge status: Home Health | Payer: Self-pay | Source: Home / Self Care | Attending: Family Medicine

## 2014-12-16 ENCOUNTER — Ambulatory Visit: Admission: AD | Admit: 2014-12-16 | Payer: Medicare Other | Admitting: Cardiology

## 2014-12-16 ENCOUNTER — Encounter (HOSPITAL_COMMUNITY): Payer: Self-pay | Admitting: *Deleted

## 2014-12-16 ENCOUNTER — Inpatient Hospital Stay (HOSPITAL_BASED_OUTPATIENT_CLINIC_OR_DEPARTMENT_OTHER): Payer: Medicare Other

## 2014-12-16 DIAGNOSIS — I340 Nonrheumatic mitral (valve) insufficiency: Secondary | ICD-10-CM | POA: Diagnosis not present

## 2014-12-16 HISTORY — PX: TEE WITHOUT CARDIOVERSION: SHX5443

## 2014-12-16 LAB — GLUCOSE, CAPILLARY
Glucose-Capillary: 115 mg/dL — ABNORMAL HIGH (ref 65–99)
Glucose-Capillary: 126 mg/dL — ABNORMAL HIGH (ref 65–99)
Glucose-Capillary: 130 mg/dL — ABNORMAL HIGH (ref 65–99)
Glucose-Capillary: 94 mg/dL (ref 65–99)

## 2014-12-16 LAB — HEMOGLOBIN A1C
Hgb A1c MFr Bld: 7.1 % — ABNORMAL HIGH (ref 4.8–5.6)
Mean Plasma Glucose: 157 mg/dL

## 2014-12-16 SURGERY — ECHOCARDIOGRAM, TRANSESOPHAGEAL
Anesthesia: Moderate Sedation

## 2014-12-16 MED ORDER — PREDNISONE 20 MG PO TABS: ORAL_TABLET | ORAL | Status: DC

## 2014-12-16 MED ORDER — STROKE: EARLY STAGES OF RECOVERY BOOK
Freq: Once | Status: AC
  Administered 2014-12-16: 13:00:00
  Filled 2014-12-16: qty 1

## 2014-12-16 MED ORDER — FENTANYL CITRATE (PF) 100 MCG/2ML IJ SOLN
INTRAMUSCULAR | Status: DC | PRN
  Administered 2014-12-16 (×3): 25 ug via INTRAVENOUS
  Administered 2014-12-16: 50 ug via INTRAVENOUS

## 2014-12-16 MED ORDER — SODIUM CHLORIDE 0.9 % IV SOLN
INTRAVENOUS | Status: DC
  Administered 2014-12-16: 14:00:00 via INTRAVENOUS

## 2014-12-16 MED ORDER — SODIUM CHLORIDE BACTERIOSTATIC 0.9 % IJ SOLN
INTRAMUSCULAR | Status: AC
  Filled 2014-12-16: qty 10

## 2014-12-16 MED ORDER — MIDAZOLAM HCL 5 MG/5ML IJ SOLN
INTRAMUSCULAR | Status: AC
  Filled 2014-12-16: qty 5

## 2014-12-16 MED ORDER — BUTAMBEN-TETRACAINE-BENZOCAINE 2-2-14 % EX AERO
INHALATION_SPRAY | CUTANEOUS | Status: DC | PRN
  Administered 2014-12-16: 2 via TOPICAL

## 2014-12-16 MED ORDER — MIDAZOLAM HCL 5 MG/5ML IJ SOLN
INTRAMUSCULAR | Status: DC | PRN
  Administered 2014-12-16: 0.5 mg via INTRAVENOUS
  Administered 2014-12-16: 1 mg via INTRAVENOUS
  Administered 2014-12-16 (×2): 0.5 mg via INTRAVENOUS

## 2014-12-16 MED ORDER — FENTANYL CITRATE (PF) 100 MCG/2ML IJ SOLN
INTRAMUSCULAR | Status: AC
  Filled 2014-12-16: qty 2

## 2014-12-16 MED ORDER — LIDOCAINE VISCOUS 2 % MT SOLN
OROMUCOSAL | Status: DC | PRN
  Administered 2014-12-16: 2 via OROMUCOSAL

## 2014-12-16 MED ORDER — LIDOCAINE VISCOUS 2 % MT SOLN
OROMUCOSAL | Status: DC
Start: 2014-12-16 — End: 2014-12-17
  Filled 2014-12-16: qty 15

## 2014-12-16 MED ORDER — PERFLUTREN LIPID MICROSPHERE
INTRAVENOUS | Status: DC | PRN
  Administered 2014-12-16: 2 mL via INTRAVENOUS

## 2014-12-16 NOTE — Care Management Note (Signed)
Case Management Note  Patient Details  Name: George [name] MRN: [id number] Date of Birth: Mar 11, 1928  Subjective/Objective:                  Pt admitted from home with CVA. Pt lives with his wife and will return home at discharge. Pt requires moderate assistance with ADL's.  Action/Plan: PT is recommending HH PT. Will discuss with pts wife per pts request. Will continue to follow for discharge planning needs.  Expected Discharge Date:                  Expected Discharge Plan:  Home w Home Health Services  In-House Referral:  NA  Discharge planning Services  CM Consult  Post Acute Care Choice:  Home Health Choice offered to:  Patient, Spouse  DME Arranged:    DME Agency:     HH Arranged:  PT HH Agency:     Status of Service:  In process, will continue to follow  Medicare Important Message Given:    Date Medicare IM Given:    Medicare IM give by:    Date Additional Medicare IM Given:    Additional Medicare Important Message give by:     If discussed at Long Length of Stay Meetings, dates discussed:    Additional Comments:  Cheryl Flash, RN 12/16/2014, 11:08 AM

## 2014-12-16 NOTE — CV Procedure (Signed)
Procedure: TEE Indication: CVA Physician: Dr Dina Rich MD   Patient was brought to the procedure suite after appropriate consent was obtained. The posterior oropharnynx was anethesized with 2% viscious lidocaine and cetacaine spray. A bite block was placed and the patient layed in the left lateral decubitus position. Moderate sedation was achieved with 2.5mg  of veresed and of fentanyl. The TEE probe was intubated into the esophagus and several views were taken. The patient tolerated the procedure well without complication.   Please see full TEE report. No evidence of intracardiac thrombus, normal LA appendage with normal emptying velocity. Negative bubble study, no evidence of intracardiac shunt.   Dominga Ferry MD

## 2014-12-16 NOTE — Evaluation (Signed)
Occupational Therapy Evaluation Patient Details Name: George [name] MRN: [id number] DOB: August 29, 1927 Today's Date: 12/16/2014    History of Present Illness With a history of left-sided brain infarction 2010, pulmonary embolism on chronic anticoagulation with Eliquis, diabetes, carotid tenolysis bilaterally (approximately 40-50% in January 2016), hypertension, diabetes, obesity, grade 1 diastolic dysfunction with preserved EF on echocardiogram 03/16 with moderate to severe aortic stenosis. Patient had new onset of right hand weakness while watching television this morning. Symptoms resolved within an hour or so. The patient had accompanying numbness and tingling in the hand. This has also resolved. The patient arrived at the hospital approximately 11 AM for evaluation. During the episode, there was no speech difficulty, weakness or numbness of the right lower extremity, or new facial droop. Patient does have some residual difficulties in walking left over from his stroke in 2010.   Clinical Impression   Pt pleasant and cooperative during OT evaluation. All LUE weakness and numbness has resolved. Pt does present with overall balance deficits from his previous stroke in 2010. Pt reports that he falls frequently. Pt states he has a BSC and urinal at home that he uses at times. Recommended that patient place [facility] at bedside at night and use urinal frequently during the day to prevent falls when he has the sudden urge to use the restroom. Pt verbalized understanding. No OT follow up.     Follow Up Recommendations  No OT follow up    Equipment Recommendations  None recommended by OT       Precautions / Restrictions Precautions Precautions: Fall;Other (comment) Precaution Comments: residual effects CVA  Restrictions Weight Bearing Restrictions: No      Mobility Bed Mobility Overal bed mobility: Needs Assistance Bed Mobility: Supine to Sit     Supine to sit: Modified independent  (Device/Increase time)        Transfers Overall transfer level: Needs assistance Equipment used: None Transfers: Sit to/from Stand Sit to Stand: Min guard                   ADL Overall ADL's : Needs assistance/impaired;At baseline                     Lower Body Dressing: Supervision/safety;Set up;Sit to/from stand;Bed level   Toilet Transfer: Min guard;Stand-pivot                             Pertinent Vitals/Pain Pain Assessment: No/denies pain     Hand Dominance Right   Extremity/Trunk Assessment Upper Extremity Assessment Upper Extremity Assessment: Overall WFL for tasks assessed   Lower Extremity Assessment Lower Extremity Assessment: Defer to PT evaluation       Communication Communication Communication: No difficulties                 Home Living Family/patient expects to be discharged to:: Private residence Living Arrangements: Spouse/significant other Available Help at Discharge: Family;Available 24 hours/day Type of Home: House Home Access: Stairs to enter [employer] of Steps: 1   Home Layout: One level               Home Equipment: Walker - standard;Shower seat;Grab bars - toilet;Grab bars - tub/shower   Additional Comments: Pt has an elevated toilet seat in bathroom, walk-in shower      Prior Functioning/Environment Level of Independence: Needs assistance  Gait / Transfers Assistance Needed: patient and famly report that he does need a little  bit of help with transfers and mobility                                     End of Session    Activity Tolerance: Patient tolerated treatment well Patient left: in chair;with call bell/phone within reach;with chair alarm set   Time: [phone] OT Time Calculation (min): 23 min Charges:  OT General Charges $OT Visit: 1 Procedure OT Evaluation $Initial OT Evaluation Tier I: 1 Procedure G-Codes:    Limmie Patricia, OTR/L,CBIS   [phone]  12/16/2014, 8:57 AM

## 2014-12-16 NOTE — Progress Notes (Signed)
Contacted regarding possible TEE for patient admitted with CVA, concern for possible cardioembolic source. Will plan for TEE this afternoon at 130pm, patient is currently NPO. Plan for moderate sedation.   Dominga Ferry MD

## 2014-12-16 NOTE — Discharge Summary (Signed)
Physician Discharge Summary  George [name]:[id number] DOB: 1928/02/03 DOA: 12/13/2014  PCP: George Pizza, MD  Admit date: 12/13/2014 Discharge date: 12/16/2014  Recommendations for Outpatient Follow-up:  1. Bilateral stroke. Suspected embolic however TEE was unremarkable. Continues on L Aquinas, statin, blood pressure medications.   Follow-up Information    Follow up with [facility].   Contact information:   [address] [place] [address] [phone]       Follow up with George Pizza, MD. Schedule an appointment as soon as possible for a visit in 1 week.   Specialty:  Internal Medicine   Contact information:    [address]  [place] [address] [phone]       Follow up with [facility], KOFI, MD In 1 month.   Specialty:  Neurology   Contact information:   [address] [place] [address] [phone]      Discharge Diagnoses:  1. Bilateral parietal stroke, nonhemorrhagic. Suspected embolic. 2. Presumed COPD with acute exacerbation 3. Diabetes mellitus 4. Moderate to severe aortic stenosis, asymptomatic  Discharge Condition: improved Disposition: home  Diet recommendation: heart healthy, diabetic diet  Filed Weights   12/13/14 1116 12/13/14 1745  Weight: 90.719 kg (200 lb) 92.534 kg (204 lb)    History of present illness:  81yom presented to ED with h/o [name] weakness and numbness. Admitted for suspected TIA and COPD exacerbation.  Hospital Course:  [name] underwent MRI which revealed small bilateral strokes. His only deficit was right upper extremity weakness and paresthesias which rapidly improved to near normal. He was evaluated by therapy and has no occupational therapy needs. He is right-handed and is using his right hand easily. Neurology coverage is not available during the time. Consider case discussed with neurology in [place] as below. It was recommended that he continue on a PACS man. Because of the  bilateral nature strokes, embolic source was suspected, TEE however was unrevealing. His hospitalization was uncomplicated. Individual issues as below. He will follow-up with neurology in one month.   Bilateral parietal stroke, non-hemorrhagic. Much improved. [name] weakness resolved. TEE unremarkable. Echo from March with normal LVEF grade 1 diastolic dysfunction. Bilateral carotid ultrasound from Jan 2016 with mild to moderate ICA stenosis. Discussed with Dr. Amada [name], neurology at [facility] who reviewed films. Recommends continue apixaban (no change) and check TEE (done)  Presumed COPD with acute exacerbation. Resolved. No formal diagnosis of COPD, but with his history of smoking and his symptoms are suggestive of COPD.   Diabetes mellitus. Remains stable.  Moderate to severe aortic stenosis. Asymptomatic.  H/o PE and DVT on apixaban  Consultants:  PT, Home Health PT or assisted care at home.   OT: no follow-up  Neurology  Procedures:  TEE No evidence of intracardiac thrombus, normal LA appendage with normal emptying velocity. Negative bubble study, no evidence of intracardiac shunt.  Discharge Instructions  Discharge Instructions    Diet - low sodium heart healthy    Complete by:  As directed      Diet Carb Modified    Complete by:  As directed      Discharge instructions    Complete by:  As directed   Call your physician or seek immediate medical attention for weakness, numbness, confusion, difficulty speaking or swallowing or worsening of condition.     Increase activity slowly    Complete by:  As directed           Current Discharge Medication List  START taking these medications   Details  predniSONE (DELTASONE) 20 MG tablet Take 40 mg by mouth daily for 2 days, then take 20 mg by mouth daily for 2 days, then take 10 mg by mouth daily for 2 days, then stop. Qty: 14 tablet, Refills: 0      CONTINUE these medications which have NOT CHANGED   Details  apixaban  (ELIQUIS) 5 MG TABS tablet Take 5 mg by mouth 2 (two) times daily.    Ascorbic Acid (VITAMIN C) 1000 MG tablet Take 1,500 mg by mouth daily.     atorvastatin (LIPITOR) 80 MG tablet Take 1 tablet (80 mg total) by mouth daily at 6 PM. Qty: 30 tablet, Refills: 3    bumetanide (BUMEX) 2 MG tablet Take 1 mg by mouth daily. 1/2 tab daily    carvedilol (COREG) 3.125 MG tablet TAKE 1 TABLET BY MOUTH TWICE DAILY WITH MEALS. Qty: 180 tablet, Refills: 3    Cholecalciferol (VITAMIN D) 2000 UNITS CAPS Take 1 capsule by mouth daily after supper.    docusate sodium (COLACE) 100 MG capsule Take 100 mg by mouth 2 (two) times daily.    donepezil (ARICEPT) 10 MG tablet Take 10 mg by mouth daily after supper.     glimepiride (AMARYL) 2 MG tablet Take 2 mg by mouth daily before breakfast.      lisinopril (PRINIVIL,ZESTRIL) 5 MG tablet Take 1 tablet (5 mg total) by mouth daily. Qty: 30 tablet, Refills: 3    metFORMIN (GLUCOPHAGE) 500 MG tablet Take 500 mg by mouth 2 (two) times daily.     Multiple Vitamin (MULTIVITAMIN WITH MINERALS) TABS tablet Take 1 tablet by mouth daily.    nitroGLYCERIN (NITROSTAT) 0.4 MG SL tablet Place 1 tablet (0.4 mg total) under the tongue every 5 (five) minutes as needed for chest pain. Qty: 25 tablet, Refills: 3       Allergies  Allergen Reactions  . Aspirin Hives and Rash    The results of significant diagnostics from this hospitalization (including imaging, microbiology, ancillary and laboratory) are listed below for reference.    Significant Diagnostic Studies: Dg Chest 2 View  12/13/2014   CLINICAL DATA:  Shortness of breath.  EXAM: CHEST  2 VIEW  COMPARISON:  December 04, 2013.  FINDINGS: Stable cardiomediastinal silhouette. No pneumothorax or pleural effusion is noted. Both lungs are clear. The visualized skeletal structures are unremarkable.  IMPRESSION: No active cardiopulmonary disease.   Electronically Signed   By: George [name], M.D.   On: 12/13/2014 12:34    Ct Head Wo Contrast  12/13/2014   CLINICAL DATA:  Right arm numbness beginning today.  EXAM: CT HEAD WITHOUT CONTRAST  TECHNIQUE: Contiguous axial images were obtained from the base of the skull through the vertex without intravenous contrast.  COMPARISON:  Head CT scan and brain MRI 02/22/2014.  FINDINGS: The brain is atrophic with extensive chronic microvascular ischemic change. Deep white matter infarction left frontal lobe is unchanged. Bilateral basal ganglia lacunar infarctions are also identified there is a remote lacunar infarction left cerebellum. No evidence of acute abnormality including hemorrhage, infarct, mass lesion, mass effect, midline shift or abnormal extra-axial fluid collection is identified. There is no hydrocephalus or pneumocephalus. The calvarium is intact.  IMPRESSION: No acute abnormality.  Atrophy and extensive chronic microvascular ischemic change.   Electronically Signed   By: Drusilla Kanner M.D.   On: 12/13/2014 12:19   Mr Maxine Glenn Head Wo Contrast  12/13/2014   CLINICAL DATA:  81 year old diabetic hypertensive male with new onset right-sided numbness. Prior stroke. Subsequent encounter.  EXAM: MRI HEAD WITHOUT CONTRAST  MRA HEAD WITHOUT CONTRAST  TECHNIQUE: Multiplanar, multiecho pulse sequences of the brain and surrounding structures were obtained without intravenous contrast. Angiographic images of the head were obtained using MRA technique without contrast.  COMPARISON:  12/13/2014 CT.  02/22/2014 and 02/12/2009 MR.  FINDINGS: MRI HEAD FINDINGS  Small acute nonhemorrhagic infarcts anterior left parietal lobe and posterior left periventricular region.  Small acute nonhemorrhagic infarct right parietal lobe.  Remote infarcts left frontal -parietal parasagittal position. Remote infarcts basal ganglia bilaterally and right thalamus. Remote right corona radiata infarcts. Remote left cerebellar infarct.  Prominent small vessel disease type changes.  Small region of blood breakdown  products inferior medial left cerebellum most consistent with result of prior hemorrhagic ischemia.  Global atrophy. Ventricular prominence has progressed over time and may reflect central atrophy although there may also be a component of superimposed mild hydrocephalus.  Cervical spondylotic changes C3-4.  Post lens replacement with mild exophthalmos.  Cervical medullary junction, pituitary region and pineal region unremarkable.  Polypoid opacification right maxillary sinus with minimal mucosal thickening left maxillary sinus.  No intracranial mass lesion noted on this unenhanced exam.  MRA HEAD FINDINGS  Artifact petrous segment of the internal carotid artery bilaterally.  Artifact cavernous segment left internal carotid artery.  Limited evaluation at these levels.  Hypoplastic A1 segment left anterior cerebral artery.  Fenestrated appearance at the junction of the A1 -2 segment of the right anterior cerebral artery. Azygous configuration anterior cerebral artery.  Middle cerebral artery moderate to marked branch vessel narrowing and irregularity.  Moderate narrowing distal left vertebral artery.  Nonvisualized right posterior inferior cerebellar artery. Moderate tandem stenosis left posterior inferior cerebellar artery.  Mild narrowing mid aspect basilar artery.  Nonvisualized anterior inferior cerebellar artery bilaterally.  Moderate to marked narrowing mid to distal aspect of the posterior cerebral arteries bilaterally.  Poor delineation of the superior cerebellar arteries beyond the origin.  No aneurysm noted.  IMPRESSION: MRI HEAD  Small acute nonhemorrhagic infarcts anterior left parietal lobe and posterior left periventricular region.  Small acute nonhemorrhagic infarct right parietal lobe.  Remote infarcts left frontal -parietal parasagittal position. Remote infarcts basal ganglia bilaterally and right thalamus. Remote right corona radiata infarcts. Remote left cerebellar infarct.  Prominent small vessel  disease type changes.  Small region of blood breakdown products inferior medial left cerebellum most consistent with result of prior hemorrhagic ischemia.  Global atrophy. Ventricular prominence has progressed over time and may reflect central atrophy although there may also be a component of superimposed mild hydrocephalus.  MRA HEAD FINDINGS  Intracranial atherosclerotic type changes as detailed above most notable involving branch vessels.   Electronically Signed   By: Lacy Duverney M.D.   On: 12/13/2014 20:12   Mri Brain Without Contrast  12/13/2014   CLINICAL DATA:  81 year old diabetic hypertensive male with new onset right-sided numbness. Prior stroke. Subsequent encounter.  EXAM: MRI HEAD WITHOUT CONTRAST  MRA HEAD WITHOUT CONTRAST  TECHNIQUE: Multiplanar, multiecho pulse sequences of the brain and surrounding structures were obtained without intravenous contrast. Angiographic images of the head were obtained using MRA technique without contrast.  COMPARISON:  12/13/2014 CT.  02/22/2014 and 02/12/2009 MR.  FINDINGS: MRI HEAD FINDINGS  Small acute nonhemorrhagic infarcts anterior left parietal lobe and posterior left periventricular region.  Small acute nonhemorrhagic infarct right parietal lobe.  Remote infarcts left frontal -parietal parasagittal position. Remote infarcts basal ganglia bilaterally  and right thalamus. Remote right corona radiata infarcts. Remote left cerebellar infarct.  Prominent small vessel disease type changes.  Small region of blood breakdown products inferior medial left cerebellum most consistent with result of prior hemorrhagic ischemia.  Global atrophy. Ventricular prominence has progressed over time and may reflect central atrophy although there may also be a component of superimposed mild hydrocephalus.  Cervical spondylotic changes C3-4.  Post lens replacement with mild exophthalmos.  Cervical medullary junction, pituitary region and pineal region unremarkable.  Polypoid  opacification right maxillary sinus with minimal mucosal thickening left maxillary sinus.  No intracranial mass lesion noted on this unenhanced exam.  MRA HEAD FINDINGS  Artifact petrous segment of the internal carotid artery bilaterally.  Artifact cavernous segment left internal carotid artery.  Limited evaluation at these levels.  Hypoplastic A1 segment left anterior cerebral artery.  Fenestrated appearance at the junction of the A1 -2 segment of the right anterior cerebral artery. Azygous configuration anterior cerebral artery.  Middle cerebral artery moderate to marked branch vessel narrowing and irregularity.  Moderate narrowing distal left vertebral artery.  Nonvisualized right posterior inferior cerebellar artery. Moderate tandem stenosis left posterior inferior cerebellar artery.  Mild narrowing mid aspect basilar artery.  Nonvisualized anterior inferior cerebellar artery bilaterally.  Moderate to marked narrowing mid to distal aspect of the posterior cerebral arteries bilaterally.  Poor delineation of the superior cerebellar arteries beyond the origin.  No aneurysm noted.  IMPRESSION: MRI HEAD  Small acute nonhemorrhagic infarcts anterior left parietal lobe and posterior left periventricular region.  Small acute nonhemorrhagic infarct right parietal lobe.  Remote infarcts left frontal -parietal parasagittal position. Remote infarcts basal ganglia bilaterally and right thalamus. Remote right corona radiata infarcts. Remote left cerebellar infarct.  Prominent small vessel disease type changes.  Small region of blood breakdown products inferior medial left cerebellum most consistent with result of prior hemorrhagic ischemia.  Global atrophy. Ventricular prominence has progressed over time and may reflect central atrophy although there may also be a component of superimposed mild hydrocephalus.  MRA HEAD FINDINGS  Intracranial atherosclerotic type changes as detailed above most notable involving branch vessels.    Electronically Signed   By: Lacy Duverney M.D.   On: 12/13/2014 20:12   Labs: Basic Metabolic Panel:  Recent Labs Lab 12/13/14 1149  NA 139  K 4.3  CL 104  CO2 28  GLUCOSE 126*  BUN 21*  CREATININE 0.92  CALCIUM 9.4   Liver Function Tests:  Recent Labs Lab 12/13/14 1149  AST 22  ALT 15*  ALKPHOS 36*  BILITOT 0.6  PROT 6.5  ALBUMIN 3.7   CBC:  Recent Labs Lab 12/13/14 1149  WBC 6.3  NEUTROABS 4.4  HGB 12.2*  HCT 37.2*  MCV 84.2  PLT 183   Cardiac Enzymes:  Recent Labs Lab 12/13/14 1149  TROPONINI <0.03       Recent Labs  12/13/14 1150  BNP 187.0*     CBG:  Recent Labs Lab 12/15/14 2149 12/16/14 0732 12/16/14 1130 12/16/14 1403 12/16/14 1633  GLUCAP 145* 115* 126* 130* 94    Principal Problem:   CVA (cerebral infarction) Active Problems:   Type 2 diabetes mellitus   COPD with acute exacerbation   Acute CVA (cerebrovascular accident)    Time coordinating discharge: 25 minutes  Signed:  [name], MD [facility] 12/16/2014, 5:52 PM

## 2014-12-16 NOTE — Progress Notes (Signed)
Physical Therapy Treatment Patient Details Name: George [name] MRN: [id number] DOB: November 18, 1927 Today's Date: 12/16/2014    History of Present Illness With a history of left-sided brain infarction 2010, pulmonary embolism on chronic anticoagulation with Eliquis, diabetes, carotid tenolysis bilaterally (approximately 40-50% in January 2016), hypertension, diabetes, obesity, grade 1 diastolic dysfunction with preserved EF on echocardiogram 03/16 with moderate to severe aortic stenosis. Patient had new onset of right hand weakness while watching television this morning. Symptoms resolved within an hour or so. The patient had accompanying numbness and tingling in the hand. This has also resolved. The patient arrived at the hospital approximately 11 AM for evaluation. During the episode, there was no speech difficulty, weakness or numbness of the right lower extremity, or new facial droop. Patient does have some residual difficulties in walking left over from his stroke in 2010.    PT Comments    Pt has no c/o today.  He received gait training using a walker with manual and verbal cues to stay inside of walker and to increase left hip flexion during swing through.  Gait remains unstable with a walker.  He was able to ambulate 110' but gait deteriorated as he fatigued.  We worked on transfer training in/out of bed, from [facility] and from a chair with verbal cues to shift weight anteriorly as well as on trying to harness his impetuous nature by slowing down and planning ahead.  We  Initiated some standing balance exercise but he was only able to do 2 exercises before being too fatigued to do anything else.  Follow Up Recommendations  Home health PT;Other (comment)     Equipment Recommendations  None recommended by PT    Recommendations for Other Services OT consult     Precautions / Restrictions Precautions Precautions: Fall Precaution Comments: residual effects CVA  Restrictions Weight Bearing  Restrictions: No    Mobility  Bed Mobility Overal bed mobility: Modified Independent Bed Mobility: Supine to Sit;Sit to Supine     Supine to sit: Modified independent (Device/Increase time) Sit to supine: Modified independent (Device/Increase time)      Transfers Overall transfer level: Needs assistance Equipment used: Rolling walker (2 wheeled) Transfers: Sit to/from Stand Sit to Stand: Supervision         General transfer comment: tends to be impetuous with movement  Ambulation/Gait Ambulation/Gait assistance: Min assist Ambulation Distance (Feet): 110 Feet Assistive device: Rolling walker (2 wheeled) Gait Pattern/deviations: Step-through pattern;Decreased stride length;Decreased dorsiflexion - left;Decreased weight shift to left;Shuffle;Drifts right/left   Gait velocity interpretation: <1.8 ft/sec, indicative of risk for recurrent falls General Gait Details: tends to drift to the right during gait and has some difficulty controlling the placement of the left foot during swing thru to heel strike   Stairs            Wheelchair Mobility    Modified Rankin (Stroke Patients Only)       Balance Overall balance assessment: History of Falls;Needs assistance Sitting-balance support: No upper extremity supported;Feet supported Sitting balance-Leahy Scale: Good     Standing balance support: Bilateral upper extremity supported Standing balance-Leahy Scale: Fair                      Cognition Arousal/Alertness: Awake/alert Behavior During Therapy: WFL for tasks assessed/performed Overall Cognitive Status: Within Functional Limits for tasks assessed                      Exercises Other Exercises Other  Exercises: standing toe raises x 10 repetitions Other Exercises: sidestepping for 10' right and left    General Comments        Pertinent Vitals/Pain Pain Assessment: No/denies pain    Home Living Family/patient expects to be discharged  to:: Private residence Living Arrangements: Spouse/significant other Available Help at Discharge: Family;Available 24 hours/day Type of Home: House Home Access: Stairs to enter   Home Layout: One level Home Equipment: Walker - standard;Shower seat;Grab bars - toilet;Grab bars - tub/shower Additional Comments: Pt has an elevated toilet seat in bathroom, walk-in shower    Prior Function Level of Independence: Needs assistance  Gait / Transfers Assistance Needed: patient and famly report that he does need a little bit of help with transfers and mobility        PT Goals (current goals can now be found in the care plan section) Progress towards PT goals: Progressing toward goals    Frequency  Min 3X/week    PT Plan Current plan remains appropriate    Co-evaluation             End of Session Equipment Utilized During Treatment: Gait belt Activity Tolerance: Patient limited by fatigue Patient left: in bed;with call bell/phone within reach;with bed alarm set;with family/visitor present     Time: 1115-1202 PT Time Calculation (min) (ACUTE ONLY): 47 min  Charges:  $Gait Training: 8-22 mins $Therapeutic Exercise: 8-22 mins $Therapeutic Activity: 8-22 mins                    G Codes:      Konrad Penta 2015-01-01, 12:22 PM

## 2014-12-16 NOTE — Progress Notes (Signed)
PROGRESS NOTE  George [name] ZOX:[id number] DOB: [birth year] DOA: 12/13/2014 PCP: Catalina Pizza, MD  Summary: 86yom presented to ED with h/o RUE weakness and numbness. Admitted for suspected TIA and COPD exacerbation.  Assessment/Plan: 1. Bilateral parietal stroke, non-hemorrhagic. Much improved. RUE weakness resolved. TEE unremarkable. Echo from March with normal LVEF grade 1 diastolic dysfunction. Bilateral carotid ultrasound from Jan 2016 with mild to moderate ICA stenosis. Discussed with Dr. Amada Jupiter, neurology at [facility] who reviewed films. Recommends continue apixaban (no change) and check TEE (done) 2. Presumed COPD with acute exacerbation. Resolved. No formal diagnosis of COPD, but with his history of smoking and his symptoms are suggestive of COPD.  3. Diabetes mellitus. Remains stable. 4. Moderate to severe aortic stenosis. Asymptomatic. 5. H/o PE and DVT on apixaban   Much better.  RUE deficits resolved.  Plan home today. Continue apixaban, Lipitor, BP meds  Home today, f/u with neurology in 1 month  Code Status: DNR  DVT prophylaxis:  Apixaban Family Communication: discussed with wife at bedside Disposition Plan: Home with HH PT  [name], MD  [facility]  Pager [phone] If 7PM-7AM, please contact night-coverage at [email or web address], password [facility] 12/16/2014, 12:52 PM  LOS: 2 days   Consultants:  PT, Home Health PT or assisted care at home.    OT: no follow-up  Neurology  Procedures:  TEE No evidence of intracardiac thrombus, normal LA appendage with normal emptying velocity. Negative bubble study, no evidence of intracardiac shunt.  Antibiotics:    HPI/Subjective: Doing well, no complaints. RUE much better, almost "back to normal".  Objective: Filed Vitals:   12/16/14 0030 12/16/14 0400 12/16/14 0750 12/16/14 1152  BP: 152/68 158/72    Pulse: 70 65    Temp: 98 F (36.7 C) 97.8 F (36.6 C)    TempSrc: Oral Oral    Resp: 18 18    Height:       Weight:      SpO2: 96% 100% 95% 97%    Intake/Output Summary (Last 24 hours) at 12/16/14 1252 Last data filed at 12/16/14 0945  Gross per 24 hour  Intake    360 ml  Output    750 ml  Net   -390 ml     Filed Weights   12/13/14 1116 12/13/14 1745  Weight: 90.719 kg (200 lb) 92.534 kg (204 lb)    Exam:     Afebrile, VSS, no hypoxia General:  Appears calm and comfortable Cardiovascular: RRR, no m/r/g.  Telemetry: SR 3 beat PVC Respiratory: CTA bilaterally, no w/r/r. Normal respiratory effort. Musculoskeletal: grossly normal tone BUE/BLE. Strength 5/5 all extremities. Psychiatric: grossly normal mood and affect, speech fluent and appropriate Neurologic: CN intact, no pronator drift, no pass pointing.  New data reviewed:  CBGs stable   Pending data:    Scheduled Meds: . antiseptic oral rinse  7 mL Mouth Rinse BID  . apixaban  5 mg Oral BID  . atorvastatin  80 mg Oral q1800  . bumetanide  1 mg Oral Daily  . docusate sodium  100 mg Oral BID  . donepezil  10 mg Oral QPC supper  . glimepiride  2 mg Oral QAC breakfast  . insulin aspart  0-20 Units Subcutaneous TID WC  . insulin aspart  0-5 Units Subcutaneous QHS  . ipratropium-albuterol  3 mL Nebulization QID  . predniSONE  40 mg Oral Q breakfast   Continuous Infusions:   Principal Problem:   CVA (cerebral infarction) Active Problems:   Type 2 diabetes  mellitus   COPD with acute exacerbation   Acute CVA (cerebrovascular accident)

## 2014-12-16 NOTE — Discharge Planning (Signed)
Pt IV and tele removed.  DC paper given, explained and educated to pt and family.  Told of needed FU appts and script at pharm.  VSS and RN assessment revealed stability.  All stroke vitals, neuros and NIH scale complete per protocol. Pt will be wheeled to car and family transporting home via car.

## 2014-12-17 ENCOUNTER — Encounter (HOSPITAL_COMMUNITY): Payer: Self-pay | Admitting: Cardiology

## 2014-12-17 NOTE — Care Management Note (Signed)
Case Management Note  Patient Details  Name: George Cook MRN: [id number] Date of Birth: 25-Mar-1928  Subjective/Objective:                    Action/Plan:   Expected Discharge Date:                  Expected Discharge Plan:  Home w Home Health Services  In-House Referral:  NA  Discharge planning Services  CM Consult  Post Acute Care Choice:  Home Health Choice offered to:  Patient, Spouse  DME Arranged:    DME Agency:     HH Arranged:  PT, RN HH Agency:  [facility]  Status of Service:  Completed, signed off  Medicare Important Message Given:    Date Medicare IM Given:    Medicare IM give by:    Date Additional Medicare IM Given:    Additional Medicare Important Message give by:     If discussed at Long Length of Stay Meetings, dates discussed:    Additional Comments: Pt was discharged home on 12/16/14 with [facility] RN and Pt (per pts choice). Emma with [facility] is aware of pt and will collect pts information from the chart. HH services to start within 48 hours of discharge. No new DME needs noted. Pt aware of discharge arrangements. Arlyss Queen [place], RN 12/17/2014, 7:23 AM

## 2014-12-25 NOTE — Telephone Encounter (Signed)
Error

## 2015-03-05 ENCOUNTER — Other Ambulatory Visit: Payer: Self-pay | Admitting: "Endocrinology

## 2015-03-23 ENCOUNTER — Emergency Department (HOSPITAL_COMMUNITY): Payer: Medicare Other

## 2015-03-23 ENCOUNTER — Inpatient Hospital Stay (HOSPITAL_COMMUNITY)
Admission: EM | Admit: 2015-03-23 | Discharge: 2015-03-25 | DRG: 871 | Disposition: A | Discharge status: Skilled Nursing Facility | Payer: Medicare Other | Attending: Family Medicine | Admitting: Family Medicine

## 2015-03-23 ENCOUNTER — Encounter (HOSPITAL_COMMUNITY): Payer: Self-pay | Admitting: Emergency Medicine

## 2015-03-23 DIAGNOSIS — Z794 Long term (current) use of insulin: Secondary | ICD-10-CM

## 2015-03-23 DIAGNOSIS — I10 Essential (primary) hypertension: Secondary | ICD-10-CM | POA: Diagnosis present

## 2015-03-23 DIAGNOSIS — Z8673 Personal history of transient ischemic attack (TIA), and cerebral infarction without residual deficits: Secondary | ICD-10-CM

## 2015-03-23 DIAGNOSIS — G9340 Encephalopathy, unspecified: Secondary | ICD-10-CM | POA: Diagnosis present

## 2015-03-23 DIAGNOSIS — Z66 Do not resuscitate: Secondary | ICD-10-CM | POA: Diagnosis present

## 2015-03-23 DIAGNOSIS — F0390 Unspecified dementia without behavioral disturbance: Secondary | ICD-10-CM | POA: Diagnosis present

## 2015-03-23 DIAGNOSIS — J441 Chronic obstructive pulmonary disease with (acute) exacerbation: Secondary | ICD-10-CM | POA: Diagnosis present

## 2015-03-23 DIAGNOSIS — E119 Type 2 diabetes mellitus without complications: Secondary | ICD-10-CM | POA: Diagnosis present

## 2015-03-23 DIAGNOSIS — Z7984 Long term (current) use of oral hypoglycemic drugs: Secondary | ICD-10-CM | POA: Diagnosis not present

## 2015-03-23 DIAGNOSIS — Z87891 Personal history of nicotine dependence: Secondary | ICD-10-CM | POA: Diagnosis not present

## 2015-03-23 DIAGNOSIS — Z823 Family history of stroke: Secondary | ICD-10-CM

## 2015-03-23 DIAGNOSIS — Z825 Family history of asthma and other chronic lower respiratory diseases: Secondary | ICD-10-CM | POA: Diagnosis not present

## 2015-03-23 DIAGNOSIS — J189 Pneumonia, unspecified organism: Secondary | ICD-10-CM | POA: Diagnosis present

## 2015-03-23 DIAGNOSIS — R531 Weakness: Secondary | ICD-10-CM | POA: Diagnosis not present

## 2015-03-23 DIAGNOSIS — H3530 Unspecified macular degeneration: Secondary | ICD-10-CM | POA: Diagnosis present

## 2015-03-23 DIAGNOSIS — I350 Nonrheumatic aortic (valve) stenosis: Secondary | ICD-10-CM | POA: Diagnosis present

## 2015-03-23 DIAGNOSIS — A419 Sepsis, unspecified organism: Principal | ICD-10-CM | POA: Diagnosis present

## 2015-03-23 DIAGNOSIS — R509 Fever, unspecified: Secondary | ICD-10-CM

## 2015-03-23 DIAGNOSIS — J449 Chronic obstructive pulmonary disease, unspecified: Secondary | ICD-10-CM

## 2015-03-23 DIAGNOSIS — Z8249 Family history of ischemic heart disease and other diseases of the circulatory system: Secondary | ICD-10-CM

## 2015-03-23 DIAGNOSIS — I639 Cerebral infarction, unspecified: Secondary | ICD-10-CM | POA: Diagnosis present

## 2015-03-23 DIAGNOSIS — Z833 Family history of diabetes mellitus: Secondary | ICD-10-CM | POA: Diagnosis not present

## 2015-03-23 DIAGNOSIS — E872 Acidosis: Secondary | ICD-10-CM | POA: Diagnosis present

## 2015-03-23 LAB — LACTIC ACID, PLASMA
Lactic Acid, Venous: 3.5 mmol/L (ref 0.5–2.0)
Lactic Acid, Venous: 3.3 mmol/L (ref 0.5–2.0)
Lactic Acid, Venous: 3.8 mmol/L (ref 0.5–2.0)

## 2015-03-23 LAB — BRAIN NATRIURETIC PEPTIDE: B NATRIURETIC PEPTIDE 5: 183 pg/mL — AB (ref 0.0–100.0)

## 2015-03-23 LAB — BASIC METABOLIC PANEL
ANION GAP: 12 (ref 5–15)
BUN: 19 mg/dL (ref 6–20)
CO2: 26 mmol/L (ref 22–32)
Calcium: 9.2 mg/dL (ref 8.9–10.3)
Chloride: 102 mmol/L (ref 101–111)
Creatinine, Ser: 0.84 mg/dL (ref 0.61–1.24)
GFR calc Af Amer: >60 mL/min (ref >60)
GFR calc non Af Amer: >60 mL/min (ref >60)
GLUCOSE: 72 mg/dL (ref 65–99)
POTASSIUM: 3.7 mmol/L (ref 3.5–5.1)
Sodium: 140 mmol/L (ref 135–145)

## 2015-03-23 LAB — CBC
HCT: 38 % — ABNORMAL LOW (ref 39.0–52.0)
Hemoglobin: 12.5 g/dL — ABNORMAL LOW (ref 13.0–17.0)
MCH: 28.2 pg (ref 26.0–34.0)
MCHC: 32.9 g/dL (ref 30.0–36.0)
MCV: 85.8 fL (ref 78.0–100.0)
PLATELETS: 197 10*3/uL (ref 150–400)
RBC: 4.43 MIL/uL (ref 4.22–5.81)
RDW: 13.9 % (ref 11.5–15.5)
WBC: 12.6 10*3/uL — ABNORMAL HIGH (ref 4.0–10.5)

## 2015-03-23 LAB — TROPONIN I

## 2015-03-23 LAB — GLUCOSE, CAPILLARY: Glucose-Capillary: 214 mg/dL — ABNORMAL HIGH (ref 65–99)

## 2015-03-23 LAB — PROCALCITONIN: Procalcitonin: 26.48 ng/mL

## 2015-03-23 MED ORDER — SODIUM CHLORIDE 0.45 % IV BOLUS
500.0000 mL | Freq: Once | INTRAVENOUS | Status: AC
  Administered 2015-03-24: 500 mL via INTRAVENOUS

## 2015-03-23 MED ORDER — SODIUM CHLORIDE 0.9 % IV SOLN
250.0000 mL | INTRAVENOUS | Status: DC | PRN
  Administered 2015-03-23: 250 mL via INTRAVENOUS

## 2015-03-23 MED ORDER — SODIUM CHLORIDE 0.9 % IJ SOLN
3.0000 mL | Freq: Two times a day (BID) | INTRAMUSCULAR | Status: DC
  Administered 2015-03-23 – 2015-03-25 (×3): 3 mL via INTRAVENOUS

## 2015-03-23 MED ORDER — METHYLPREDNISOLONE SODIUM SUCC 125 MG IJ SOLR
125.0000 mg | Freq: Once | INTRAMUSCULAR | Status: AC
  Administered 2015-03-23: 125 mg via INTRAVENOUS
  Filled 2015-03-23: qty 2

## 2015-03-23 MED ORDER — SODIUM CHLORIDE 0.9 % IV BOLUS (SEPSIS)
1000.0000 mL | Freq: Once | INTRAVENOUS | Status: AC
  Administered 2015-03-23: 1000 mL via INTRAVENOUS

## 2015-03-23 MED ORDER — ONDANSETRON HCL 4 MG/2ML IJ SOLN: 4.0000 mg | Freq: Four times a day (QID) | INTRAMUSCULAR | Status: DC | PRN

## 2015-03-23 MED ORDER — DEXTROSE 5 % IV SOLN
500.0000 mg | Freq: Once | INTRAVENOUS | Status: AC
  Administered 2015-03-23: 500 mg via INTRAVENOUS
  Filled 2015-03-23: qty 500

## 2015-03-23 MED ORDER — ACETAMINOPHEN 500 MG PO TABS
1000.0000 mg | ORAL_TABLET | Freq: Once | ORAL | Status: AC
  Administered 2015-03-23: 1000 mg via ORAL
  Filled 2015-03-23: qty 2

## 2015-03-23 MED ORDER — DONEPEZIL HCL 5 MG PO TABS
10.0000 mg | ORAL_TABLET | Freq: Every day | ORAL | Status: DC
  Administered 2015-03-23 – 2015-03-24 (×2): 10 mg via ORAL
  Filled 2015-03-23 (×2): qty 2

## 2015-03-23 MED ORDER — METHYLPREDNISOLONE SODIUM SUCC 125 MG IJ SOLR
60.0000 mg | Freq: Two times a day (BID) | INTRAMUSCULAR | Status: DC
  Administered 2015-03-24 – 2015-03-25 (×3): 60 mg via INTRAVENOUS
  Filled 2015-03-23 (×3): qty 2

## 2015-03-23 MED ORDER — DEXTROSE 5 % IV SOLN
1.0000 g | Freq: Once | INTRAVENOUS | Status: AC
  Administered 2015-03-23: 1 g via INTRAVENOUS
  Filled 2015-03-23: qty 10

## 2015-03-23 MED ORDER — IPRATROPIUM-ALBUTEROL 0.5-2.5 (3) MG/3ML IN SOLN
3.0000 mL | RESPIRATORY_TRACT | Status: DC
  Administered 2015-03-23 – 2015-03-24 (×3): 3 mL via RESPIRATORY_TRACT
  Filled 2015-03-23 (×3): qty 3

## 2015-03-23 MED ORDER — ALBUTEROL SULFATE (2.5 MG/3ML) 0.083% IN NEBU
5.0000 mg | INHALATION_SOLUTION | Freq: Once | RESPIRATORY_TRACT | Status: AC
  Administered 2015-03-23: 5 mg via RESPIRATORY_TRACT
  Filled 2015-03-23: qty 6

## 2015-03-23 MED ORDER — ONDANSETRON HCL 4 MG PO TABS: 4.0000 mg | ORAL_TABLET | Freq: Four times a day (QID) | ORAL | Status: DC | PRN

## 2015-03-23 MED ORDER — INSULIN ASPART 100 UNIT/ML ~~LOC~~ SOLN
0.0000 [IU] | Freq: Three times a day (TID) | SUBCUTANEOUS | Status: DC
  Administered 2015-03-24: 2 [IU] via SUBCUTANEOUS
  Administered 2015-03-24 – 2015-03-25 (×3): 3 [IU] via SUBCUTANEOUS
  Administered 2015-03-25: 2 [IU] via SUBCUTANEOUS

## 2015-03-23 MED ORDER — DEXTROSE 5 % IV SOLN
500.0000 mg | INTRAVENOUS | Status: DC
  Administered 2015-03-24: 500 mg via INTRAVENOUS
  Filled 2015-03-23 (×2): qty 500

## 2015-03-23 MED ORDER — SODIUM CHLORIDE 0.9 % IV BOLUS (SEPSIS)
2000.0000 mL | Freq: Once | INTRAVENOUS | Status: AC
  Administered 2015-03-23: 2000 mL via INTRAVENOUS

## 2015-03-23 MED ORDER — ALBUTEROL SULFATE (2.5 MG/3ML) 0.083% IN NEBU: 2.5000 mg | INHALATION_SOLUTION | RESPIRATORY_TRACT | Status: DC | PRN

## 2015-03-23 MED ORDER — SODIUM CHLORIDE 0.9 % IJ SOLN
3.0000 mL | INTRAMUSCULAR | Status: DC | PRN
  Administered 2015-03-24: 3 mL via INTRAVENOUS
  Filled 2015-03-23: qty 3

## 2015-03-23 MED ORDER — CARVEDILOL 3.125 MG PO TABS
3.1250 mg | ORAL_TABLET | Freq: Two times a day (BID) | ORAL | Status: DC
  Administered 2015-03-23 – 2015-03-25 (×4): 3.125 mg via ORAL
  Filled 2015-03-23 (×4): qty 1

## 2015-03-23 MED ORDER — APIXABAN 5 MG PO TABS
5.0000 mg | ORAL_TABLET | Freq: Two times a day (BID) | ORAL | Status: DC
  Administered 2015-03-23 – 2015-03-25 (×4): 5 mg via ORAL
  Filled 2015-03-23 (×4): qty 1

## 2015-03-23 MED ORDER — DEXTROSE 5 % IV SOLN
1.0000 g | INTRAVENOUS | Status: DC
  Administered 2015-03-24: 1 g via INTRAVENOUS
  Filled 2015-03-23 (×2): qty 10

## 2015-03-23 NOTE — ED Notes (Signed)
CRITICAL VALUE ALERT  Critical value received:  Lactic 3.5  Date of notification:  03/23/2015  Time of notification:  1549  Critical value read back:Yes.    Nurse who received alert:  [facility] RN  MD notified (1st page):  Dr. Fayrene FearingJames  Time of first page:   1549  MD notified (2nd page):  Time of second page:  Responding MD:  Dr. Fayrene FearingJames  Time MD responded:   [phone]

## 2015-03-23 NOTE — H&P (Signed)
History and Physical  George Cook ZOX:[id number] DOB: 1928-02-23 DOA: 03/23/2015  Referring physician: Dr. Rolland Porter in ED PCP: Dwana Melena, MD   Chief Complaint: weak at home, confused  HPI:  81 yom PMH dementia, DM, stroke presented acute onset confusion, restlessness and generalized weakness. Initial evaluation notable for likely RLL pneumonia, lactic acid 3.5. Admitted for sepsis, CAP.  Last hospitalization 11/2014 for presumed embolic stroke, presumed COPD exacerbation.  Confused and unable to provide history. Hx per wife, daughter and son at bedside. Lives with wife; partial assist with ADLs, moderate dementia. Was fine yesterday, no recent illness or systemic symptoms. Was fine this AM, had a good breakfast and took morning meds. Around 10-11 AM developed listlessness, generalized weakness, listed to the side in chair and she helped him to floor where he fell asleep. Sons returned home and assisted. Noted to be generally weak without focal deficit. No specific complaints except more confused. No systemic symptoms. Now coughing in ED.   In the emergency department afebrile, RR 20s, normotensive  Pertinent labs: BMP, troponin, BNP unremarkable. WBC 12.6. Lactic acid 3.5 >> 3.3. EKG: Independently reviewed. ST, poor quality, no acute changes Imaging: independently reviewed CXR, NAD per radiology. My read RLL opacity suspicious for developing pneumonia.  Review of Systems:  Pt's hx unreliable. Per family negative for fever, sore throat, rash, new muscle aches, chest pain, SOB, dysuria, bleeding, n/v/abdominal pain.  Positive for poor vision, macular degeneration    Past Medical History  Diagnosis Date  . HTN (hypertension)   . DM (diabetes mellitus) (HCC)   . Carotid artery occlusion     a. 05/2012 U/S Bilat ICA 20-39%.  . Obesity   . Stroke [facility])     a. 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 01/2009 TEE: EF 60-65%, mild MR, no LA/LAA thrombus.  . Macular degeneration   .  Hiatal hernia   . Nephrolithiasis     Past Surgical History  Procedure Laterality Date  . Finger surgery Left     pinky, after saw cut it off  . Cataract extraction w/phaco Right 10/02/2012    Procedure: CATARACT EXTRACTION PHACO AND INTRAOCULAR LENS PLACEMENT (IOC);  Surgeon: Gemma Payor, MD;  Location: [facility] ORS;  Service: Ophthalmology;  Laterality: Right;  CDE 12.78  . Eye surgery    . Cataract extraction w/phaco Left 10/30/2012    Procedure: CATARACT EXTRACTION PHACO AND INTRAOCULAR LENS PLACEMENT (IOC);  Surgeon: Gemma Payor, MD;  Location: [facility] ORS;  Service: Ophthalmology;  Laterality: Left;  CDE: 19.10  . Left heart catheterization with coronary angiogram Bilateral 03/08/2013    Procedure: LEFT HEART CATHETERIZATION WITH CORONARY ANGIOGRAM;  Surgeon: Laurey Morale, MD;  Location: [facility] CATH LAB;  Service: Cardiovascular;  Laterality: Bilateral;  . Tee without cardioversion N/A 12/16/2014    Procedure: TRANSESOPHAGEAL ECHOCARDIOGRAM (TEE);  Surgeon: Antoine Poche, MD;  Location: [facility] ENDO SUITE;  Service: Cardiology;  Laterality: N/A;  will need 40 minutes to clean scope between procedures    Social History:  reports that he quit smoking about 23 years ago. His smoking use included Cigarettes. He started smoking about 65 years ago. He has a 120 pack-year smoking history. He has never used smokeless tobacco. He reports that he does not drink alcohol or use illicit drugs. lives with their spouse Partial assistance  Allergies  Allergen Reactions  . Aspirin Hives and Rash    Family History  Problem Relation Age of Onset  . Diabetes Mother   . Stroke  Mother   . Emphysema Mother   . Hypertension Mother   . Coronary artery disease Father   . Heart disease Father     Beefore age 79  . Stroke Sister   . Diabetes Sister   . Hypertension Sister   . Coronary artery disease Brother   . Heart disease Brother     Before age 79- CABG     Prior to Admission medications   Medication Sig  Start Date End Date Taking? Authorizing Provider  apixaban (ELIQUIS) 5 MG TABS tablet Take 5 mg by mouth 2 (two) times daily.    Historical Provider, MD  Ascorbic Acid (VITAMIN C) 1000 MG tablet Take 1,500 mg by mouth daily.     Historical Provider, MD  atorvastatin (LIPITOR) 80 MG tablet Take 1 tablet (80 mg total) by mouth daily at 6 PM. Patient taking differently: Take 20 mg by mouth daily at 6 PM.  03/13/13   Antoine PocheJonathan F Branch, MD  bumetanide (BUMEX) 2 MG tablet Take 1 mg by mouth daily. 1/2 tab daily    Historical Provider, MD  carvedilol (COREG) 3.125 MG tablet TAKE 1 TABLET BY MOUTH TWICE DAILY WITH MEALS. 10/25/14   Laqueta LindenSuresh A Koneswaran, MD  Cholecalciferol (VITAMIN D) 2000 UNITS CAPS Take 1 capsule by mouth daily after supper.    Historical Provider, MD  docusate sodium (COLACE) 100 MG capsule Take 100 mg by mouth 2 (two) times daily.    Historical Provider, MD  donepezil (ARICEPT) 10 MG tablet Take 10 mg by mouth daily after supper.     Historical Provider, MD  glimepiride (AMARYL) 2 MG tablet Take 2 mg by mouth daily before breakfast.      Historical Provider, MD  lisinopril (PRINIVIL,ZESTRIL) 5 MG tablet Take 1 tablet (5 mg total) by mouth daily. 03/13/13   Antoine PocheJonathan F Branch, MD  metFORMIN (GLUCOPHAGE) 500 MG tablet TAKE (1) TABLET BY MOUTH TWICE DAILY. 03/05/15   Roma KayserGebreselassie W Nida, MD  Multiple Vitamin (MULTIVITAMIN WITH MINERALS) TABS tablet Take 1 tablet by mouth daily.    Historical Provider, MD  nitroGLYCERIN (NITROSTAT) 0.4 MG SL tablet Place 1 tablet (0.4 mg total) under the tongue every 5 (five) minutes as needed for chest pain. 03/13/13   Antoine PocheJonathan F Branch, MD  predniSONE (DELTASONE) 20 MG tablet Take 40 mg by mouth daily for 2 days, then take 20 mg by mouth daily for 2 days, then take 10 mg by mouth daily for 2 days, then stop. 12/16/14   Standley Brookinganiel P Ailine Hefferan, MD   Physical Exam: Filed Vitals:   03/23/15 1419 03/23/15 1441 03/23/15 1500 03/23/15 1501  BP: 151/100  126/71    Pulse: 97   95  Temp: 100.4 F (38 C)     TempSrc: Axillary     Resp: 28   26  Height: 5\' 10"  (1.778 m)     Weight: 92.534 kg (204 lb)     SpO2: 90% 95%  97%     General:  Appears restless, redirects easily but constantly trying to get out of the bed. Very pleasant and compliant albeit briefly Eyes: PERRL, normal lids, irises   ENT: grossly normal hearing, lips & tongue Neck: no LAD, masses or thyromegaly Cardiovascular: RRR, 3/6 holosystolic murmur RUSB. No r/g. No LE edema. Respiratory: CTA bilaterally, no w/r/r. Moderate increased respiratory effort. Tachynepic no retractions but some abdominal breathing noted. Speaks in full sentences. Abdomen: soft, ntnd Skin: no rash or induration noted Musculoskeletal: grossly normal tone BUE/BLE; moves  all extremities to command. Strength all extremities appears 5/5 Psychiatric: confused, restless but recognizes wife, daughter and son. Knows he's in the hospital. Not oriented to date/year at baseline. Neurologic: grossly non-focal. Face symmetric.  Wt Readings from Last 3 Encounters:  03/23/15 92.534 kg (204 lb)  12/13/14 92.534 kg (204 lb)  12/13/14 92.534 kg (204 lb)    Labs on Admission:  Basic Metabolic Panel:  Recent Labs Lab 03/23/15 1452  NA 140  K 3.7  CL 102  CO2 26  GLUCOSE 72  BUN 19  CREATININE 0.84  CALCIUM 9.2    CBC:  Recent Labs Lab 03/23/15 1452  WBC 12.6*  HGB 12.5*  HCT 38.0*  MCV 85.8  PLT 197    Cardiac Enzymes:  Recent Labs Lab 03/23/15 1452  TROPONINI <0.03     Radiological Exams on Admission: Dg Chest Portable 1 View  03/23/2015  CLINICAL DATA:  Shortness of breath, weakness EXAM: PORTABLE CHEST 1 VIEW COMPARISON:  12/13/2014 FINDINGS: Lungs are essentially clear. No focal consolidation. No pleural effusion or pneumothorax. Cardiomegaly. IMPRESSION: No evidence of acute cardiopulmonary disease. Electronically Signed   By: Charline Bills M.D.   On: 03/23/2015 14:51       Principal Problem:   CAP (community acquired pneumonia) Active Problems:   Type 2 diabetes mellitus (HCC)   Moderate aortic stenosis   CVA (cerebral infarction)   Sepsis (HCC)   COPD (chronic obstructive pulmonary disease) (HCC)   Acute encephalopathy   Assessment/Plan 1. CAP with suspected early sepsis. 3 SIRS criteria. Lactic acidosis and confusion above baseline. 2. Acute encephalopathy superimposed on moderate dementia.  3. Suspected COPD  4. Moderate to severe aortic stenosis, appears asymptomatic. 5. DM type 2, stable. AG 12, glucose 72. 6. Stroke 11/2014, presumed embolic, now on Eliquis   Acute onset of illness with moderate increased resp effort and abd breathing concerning for developing sepsis and pneumonia. Prognosis guarded.  Admit to SDU  Empiric abx, pneumonia protocol, steroids  Sepsis bundle  PRN nebs  SSI  Code Status: DNR/DNI  DVT prophylaxis: Eliquis Family Communication: discussed in detail with wife, son and daughter at bedside Disposition Plan/Anticipated LOS: admit, 3 days  Time spent: 58 minutes  [name], MD  [facility] Pager [phone] 03/23/2015, 4:34 PM

## 2015-03-23 NOTE — ED Notes (Signed)
PT family reports pt unable to ambulate after 0800 am with generalized weakness and increased SOB. PT presents to ED needing help out of vehicle and tachypnea. PT denies any pain.

## 2015-03-23 NOTE — ED Provider Notes (Signed)
CSN: [id number]     Arrival date & time 03/23/15  1406 History   First MD Initiated Contact with Patient 03/23/15 1451     Chief Complaint  Patient presents with  . Shortness of Breath      HPI  Patient presents for evaluation of weakness and difficult breathing. History of COPD. He uses home nebulizer. Normal day yesterday. Wife states that he was unable to get up and ambulate independently this morning because of generalized weakness and more shortness of breath which is unusual for him. No noted fever at home has had more of a cough and shortness of breath today.  Denies chest pain. No GI complaints. No localizing weakness.  Past Medical History  Diagnosis Date  . HTN (hypertension)   . DM (diabetes mellitus) (HCC)   . Carotid artery occlusion     a. 05/2012 U/S Bilat ICA 20-39%.  . Obesity   . Stroke [facility])     a. 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 01/2009 TEE: EF 60-65%, mild MR, no LA/LAA thrombus.  . Macular degeneration   . Hiatal hernia   . Nephrolithiasis    Past Surgical History  Procedure Laterality Date  . Finger surgery Left     pinky, after saw cut it off  . Cataract extraction w/phaco Right 10/02/2012    Procedure: CATARACT EXTRACTION PHACO AND INTRAOCULAR LENS PLACEMENT (IOC);  Surgeon: Gemma Payor, MD;  Location: [facility] ORS;  Service: Ophthalmology;  Laterality: Right;  CDE 12.78  . Eye surgery    . Cataract extraction w/phaco Left 10/30/2012    Procedure: CATARACT EXTRACTION PHACO AND INTRAOCULAR LENS PLACEMENT (IOC);  Surgeon: Gemma Payor, MD;  Location: [facility] ORS;  Service: Ophthalmology;  Laterality: Left;  CDE: 19.10  . Left heart catheterization with coronary angiogram Bilateral 03/08/2013    Procedure: LEFT HEART CATHETERIZATION WITH CORONARY ANGIOGRAM;  Surgeon: Laurey Morale, MD;  Location: [facility] CATH LAB;  Service: Cardiovascular;  Laterality: Bilateral;  . Tee without cardioversion N/A 12/16/2014    Procedure: TRANSESOPHAGEAL ECHOCARDIOGRAM (TEE);  Surgeon:  Antoine Poche, MD;  Location: [facility] ENDO SUITE;  Service: Cardiology;  Laterality: N/A;  will need 40 minutes to clean scope between procedures   Family History  Problem Relation Age of Onset  . Diabetes Mother   . Stroke Mother   . Emphysema Mother   . Hypertension Mother   . Coronary artery disease Father   . Heart disease Father     Beefore age 19  . Stroke Sister   . Diabetes Sister   . Hypertension Sister   . Coronary artery disease Brother   . Heart disease Brother     Before age 41- CABG   Social History  Substance Use Topics  . Smoking status: Former Smoker -- 3.00 packs/day for 40 years    Types: Cigarettes    Start date: 05/24/1949    Quit date: 05/25/1991  . Smokeless tobacco: Never Used  . Alcohol Use: No    Review of Systems  Constitutional: Positive for fever. Negative for chills, diaphoresis, appetite change and fatigue.  HENT: Negative for mouth sores, sore throat and trouble swallowing.   Eyes: Negative for visual disturbance.  Respiratory: Positive for cough, shortness of breath and wheezing. Negative for chest tightness.   Cardiovascular: Negative for chest pain.  Gastrointestinal: Negative for nausea, vomiting, abdominal pain, diarrhea and abdominal distention.  Endocrine: Negative for polydipsia, polyphagia and polyuria.  Genitourinary: Negative for dysuria, frequency and hematuria.  Musculoskeletal: Negative  for gait problem.  Skin: Negative for color change, pallor and rash.  Neurological: Positive for weakness. Negative for dizziness, syncope, light-headedness and headaches.  Hematological: Does not bruise/bleed easily.  Psychiatric/Behavioral: Negative for behavioral problems and confusion.      Allergies  Aspirin  Home Medications   Prior to Admission medications   Medication Sig Start Date End Date Taking? Authorizing Provider  apixaban (ELIQUIS) 5 MG TABS tablet Take 5 mg by mouth 2 (two) times daily.    Historical Provider, MD   Ascorbic Acid (VITAMIN C) 1000 MG tablet Take 1,500 mg by mouth daily.     Historical Provider, MD  atorvastatin (LIPITOR) 80 MG tablet Take 1 tablet (80 mg total) by mouth daily at 6 PM. Patient taking differently: Take 20 mg by mouth daily at 6 PM.  03/13/13   Antoine Poche, MD  bumetanide (BUMEX) 2 MG tablet Take 1 mg by mouth daily. 1/2 tab daily    Historical Provider, MD  carvedilol (COREG) 3.125 MG tablet TAKE 1 TABLET BY MOUTH TWICE DAILY WITH MEALS. 10/25/14   Laqueta Linden, MD  Cholecalciferol (VITAMIN D) 2000 UNITS CAPS Take 1 capsule by mouth daily after supper.    Historical Provider, MD  docusate sodium (COLACE) 100 MG capsule Take 100 mg by mouth 2 (two) times daily.    Historical Provider, MD  donepezil (ARICEPT) 10 MG tablet Take 10 mg by mouth daily after supper.     Historical Provider, MD  glimepiride (AMARYL) 2 MG tablet Take 2 mg by mouth daily before breakfast.      Historical Provider, MD  lisinopril (PRINIVIL,ZESTRIL) 5 MG tablet Take 1 tablet (5 mg total) by mouth daily. 03/13/13   Antoine Poche, MD  metFORMIN (GLUCOPHAGE) 500 MG tablet TAKE (1) TABLET BY MOUTH TWICE DAILY. 03/05/15   Roma Kayser, MD  Multiple Vitamin (MULTIVITAMIN WITH MINERALS) TABS tablet Take 1 tablet by mouth daily.    Historical Provider, MD  nitroGLYCERIN (NITROSTAT) 0.4 MG SL tablet Place 1 tablet (0.4 mg total) under the tongue every 5 (five) minutes as needed for chest pain. 03/13/13   Antoine Poche, MD  predniSONE (DELTASONE) 20 MG tablet Take 40 mg by mouth daily for 2 days, then take 20 mg by mouth daily for 2 days, then take 10 mg by mouth daily for 2 days, then stop. 12/16/14   Standley Brooking, MD   BP 126/71 mmHg  Pulse 95  Temp(Src) 100.4 F (38 C) (Axillary)  Resp 26  Ht  (1.778 m)  Wt 204 lb (92.534 kg)  BMI 29.27 kg/m2  SpO2 97% Physical Exam  Constitutional: He is oriented to person, place, and time. He appears well-developed and well-nourished.  No distress.  HENT:  Head: Normocephalic.  Eyes: Conjunctivae are normal. Pupils are equal, round, and reactive to light. No scleral icterus.  Neck: Normal range of motion. Neck supple. No thyromegaly present.  Cardiovascular: Normal rate and regular rhythm.  Exam reveals no gallop and no friction rub.   No murmur heard. Pulmonary/Chest: He is in respiratory distress. He has wheezes in the right upper field, the right middle field, the right lower field, the left upper field, the left middle field and the left lower field. He has rhonchi in the right lower field and the left lower field. He has rales in the right lower field and the left lower field.  Wheezing prolongation in all fields. Basilar crackles right greater than left.  Abdominal:  Soft. Bowel sounds are normal. He exhibits no distension. There is no tenderness. There is no rebound.  Musculoskeletal: Normal range of motion.  Neurological: He is alert and oriented to person, place, and time.  Skin: Skin is warm and dry. No rash noted.  No dependent edema.  Psychiatric: He has a normal mood and affect. His behavior is normal.    ED Course  Procedures (including critical care time) Labs Review Labs Reviewed  CBC - Abnormal; Notable for the following:    WBC 12.6 (*)    Hemoglobin 12.5 (*)    HCT 38.0 (*)    All other components within normal limits  LACTIC ACID, PLASMA - Abnormal; Notable for the following:    Lactic Acid, Venous 3.5 (*)    All other components within normal limits  BRAIN NATRIURETIC PEPTIDE - Abnormal; Notable for the following:    B Natriuretic Peptide 183.0 (*)    All other components within normal limits  URINE CULTURE  CULTURE, BLOOD (ROUTINE X 2)  CULTURE, BLOOD (ROUTINE X 2)  BASIC METABOLIC PANEL  TROPONIN I  LACTIC ACID, PLASMA  URINALYSIS, ROUTINE W REFLEX MICROSCOPIC (NOT AT [facility])  I-STAT CG4 LACTIC ACID, ED    Imaging Review Dg Chest Portable 1 View  03/23/2015  CLINICAL DATA:  Shortness  of breath, weakness EXAM: PORTABLE CHEST 1 VIEW COMPARISON:  12/13/2014 FINDINGS: Lungs are essentially clear. No focal consolidation. No pleural effusion or pneumothorax. Cardiomegaly. IMPRESSION: No evidence of acute cardiopulmonary disease. Electronically Signed   By: Charline BillsSriyesh  Krishnan M.D.   On: 03/23/2015 14:51   I have personally reviewed and evaluated these images and lab results as part of my medical decision-making.   EKG Interpretation None      MDM   Final diagnoses:  Fever, unspecified fever cause  COPD exacerbation (HCC)    Patient is dyspneic upon initial evaluation and is given immediate albuterol.  His dyspnea improves. Temp 100.4. Lactate 3.5. His given IV fluids. Does not have surgical criteria or hypotension. After cultures obtained given Rocephin and Zithromax. On exam has crackles right greater than left. Radiology has read x-ray is not showing infiltrate. It is a supine portable film, but does suggest atelectasis versus infiltrate at the right base to my initial read. I will discuss disposition with hospitalist.    Rolland PorterMark Maverick Dieudonne, MD 03/23/15 [phone]

## 2015-03-24 DIAGNOSIS — I350 Nonrheumatic aortic (valve) stenosis: Secondary | ICD-10-CM

## 2015-03-24 LAB — CBC
HEMATOCRIT: 33.8 % — AB (ref 39.0–52.0)
HEMOGLOBIN: 11 g/dL — AB (ref 13.0–17.0)
MCH: 28.1 pg (ref 26.0–34.0)
MCHC: 32.5 g/dL (ref 30.0–36.0)
MCV: 86.2 fL (ref 78.0–100.0)
Platelets: 178 10*3/uL (ref 150–400)
RBC: 3.92 MIL/uL — ABNORMAL LOW (ref 4.22–5.81)
RDW: 14 % (ref 11.5–15.5)
WBC: 17.7 10*3/uL — AB (ref 4.0–10.5)

## 2015-03-24 LAB — BASIC METABOLIC PANEL
ANION GAP: 8 (ref 5–15)
BUN: 18 mg/dL (ref 6–20)
CO2: 25 mmol/L (ref 22–32)
Calcium: 8.1 mg/dL — ABNORMAL LOW (ref 8.9–10.3)
Chloride: 104 mmol/L (ref 101–111)
Creatinine, Ser: 0.83 mg/dL (ref 0.61–1.24)
GFR calc Af Amer: >60 mL/min (ref >60)
GLUCOSE: 239 mg/dL — AB (ref 65–99)
POTASSIUM: 4.8 mmol/L (ref 3.5–5.1)
SODIUM: 137 mmol/L (ref 135–145)

## 2015-03-24 LAB — URINALYSIS, ROUTINE W REFLEX MICROSCOPIC
Bilirubin Urine: NEGATIVE
GLUCOSE, UA: NEGATIVE mg/dL
Hgb urine dipstick: NEGATIVE
Nitrite: POSITIVE — AB
PROTEIN: NEGATIVE mg/dL
SPECIFIC GRAVITY, URINE: 1.02 (ref 1.005–1.030)
Urobilinogen, UA: 0.2 mg/dL (ref 0.0–1.0)
pH: 5.5 (ref 5.0–8.0)

## 2015-03-24 LAB — GLUCOSE, CAPILLARY
GLUCOSE-CAPILLARY: 194 mg/dL — AB (ref 65–99)
GLUCOSE-CAPILLARY: 240 mg/dL — AB (ref 65–99)
Glucose-Capillary: 247 mg/dL — ABNORMAL HIGH (ref 65–99)
Glucose-Capillary: 224 mg/dL — ABNORMAL HIGH (ref 65–99)

## 2015-03-24 LAB — URINE MICROSCOPIC-ADD ON

## 2015-03-24 LAB — STREP PNEUMONIAE URINARY ANTIGEN: Strep Pneumo Urinary Antigen: NEGATIVE

## 2015-03-24 LAB — MRSA PCR SCREENING: MRSA BY PCR: NEGATIVE

## 2015-03-24 MED ORDER — IPRATROPIUM-ALBUTEROL 0.5-2.5 (3) MG/3ML IN SOLN
3.0000 mL | Freq: Two times a day (BID) | RESPIRATORY_TRACT | Status: DC
Start: 2015-03-24 — End: 2015-03-25
  Administered 2015-03-24 – 2015-03-25 (×2): 3 mL via RESPIRATORY_TRACT
  Filled 2015-03-24 (×2): qty 3

## 2015-03-24 MED ORDER — ACETAMINOPHEN 325 MG PO TABS: 650.0000 mg | ORAL_TABLET | Freq: Four times a day (QID) | ORAL | Status: DC | PRN

## 2015-03-24 NOTE — [place] FL2 (Signed)
[place] MEDICAID FL2 LEVEL OF CARE SCREENING TOOL     IDENTIFICATION  Patient Name: George Cook Birthdate: [birth year] Sex: male Admission Date (Current Location): 03/23/2015  [facility] and [place] Number:  Aaron Edelman)   Facility and Address:  [facility],  [address] [place] [address]      Provider Number: [id number]  Attending Physician Name and Address:  Standley Brooking, MD  Relative Name and Phone Number:       Current Level of Care: Hospital Recommended Level of Care: Skilled Nursing Facility Prior Approval Number:    Date Approved/Denied:   PASRR Number: [id number]  Discharge Plan: SNF    Current Diagnoses: Patient Active Problem List   Diagnosis Date Noted  . Sepsis (HCC) 03/23/2015  . CAP (community acquired pneumonia) 03/23/2015  . COPD (chronic obstructive pulmonary disease) (HCC) 03/23/2015  . Acute encephalopathy 03/23/2015  . CVA (cerebral infarction) 12/14/2014  . Acute CVA (cerebrovascular accident) (HCC) 12/14/2014  . COPD with acute exacerbation (HCC) 12/13/2014  . Type 2 diabetes mellitus (HCC) 03/11/2013  . CAD (coronary artery disease) 03/11/2013  . Moderate aortic stenosis 03/11/2013  . Right leg DVT (HCC) 03/11/2013  . Abnormal urine odor 03/11/2013  . Pulmonary embolism, bilateral (HCC) 03/09/2013  . Essential hypertension 03/07/2013  . Occlusion and stenosis of carotid artery without mention of cerebral infarction 06/07/2012  . FULL INCONTINENCE OF FECES 04/03/2010  . HEMATOCHEZIA 06/20/2009  . CHANGE IN BOWELS 06/20/2009    Orientation ACTIVITIES/SOCIAL BLADDER RESPIRATION    Self  Family supportive Incontinent Normal  BEHAVIORAL SYMPTOMS/MOOD NEUROLOGICAL BOWEL NUTRITION STATUS      Incontinent Diet (Heart Healthy, Carb Modified, Diabetic Diet.)  PHYSICIAN VISITS COMMUNICATION OF NEEDS Height & Weight Skin    Verbally  (177.8 cm) 200 lbs. Normal          AMBULATORY STATUS RESPIRATION    Assist  independent Normal      Personal Care Assistance Level of Assistance  Bathing, Feeding, Dressing Bathing Assistance: Maximum assistance Feeding assistance: Maximum assistance Dressing Assistance: Maximum assistance      Functional Limitations Info                SPECIAL CARE FACTORS FREQUENCY                      Additional Factors Info  Code Status Code Status Info: DNR (DNR)             Current Medications (03/24/2015): Current Facility-Administered Medications  Medication Dose Route Frequency Provider Last Rate Last Dose  . 0.9 %  sodium chloride infusion  250 mL Intravenous PRN Standley Brooking, MD 10 mL/hr at 03/24/15 0900 250 mL at 03/24/15 0900  . acetaminophen (TYLENOL) tablet 650 mg  650 mg Oral Q6H PRN Standley Brooking, MD      . albuterol (PROVENTIL) (2.5 MG/3ML) 0.083% nebulizer solution 2.5 mg  2.5 mg Nebulization Q2H PRN Standley Brooking, MD      . apixaban Everlene Balls) tablet 5 mg  5 mg Oral BID Standley Brooking, MD   5 mg at 03/24/15 1003  . azithromycin (ZITHROMAX) 500 mg in dextrose 5 % 250 mL IVPB  500 mg Intravenous Q24H Standley Brooking, MD      . carvedilol (COREG) tablet 3.125 mg  3.125 mg Oral BID WC Standley Brooking, MD   3.125 mg at 03/24/15 0755  . cefTRIAXone (ROCEPHIN) 1 g in dextrose 5 % 50 mL IVPB  1 g Intravenous Q24H Standley Brooking, MD      . donepezil (ARICEPT) tablet 10 mg  10 mg Oral QPC supper Standley Brooking, MD   10 mg at 03/23/15 2046  . insulin aspart (novoLOG) injection 0-9 Units  0-9 Units Subcutaneous TID WC Standley Brooking, MD   3 Units at 03/24/15 1145  . ipratropium-albuterol (DUONEB) 0.5-2.5 (3) MG/3ML nebulizer solution 3 mL  3 mL Nebulization BID Standley Brooking, MD      . methylPREDNISolone sodium succinate (SOLU-MEDROL) 125 mg/2 mL injection 60 mg  60 mg Intravenous Q12H Standley Brooking, MD   60 mg at 03/24/15 0530  . ondansetron (ZOFRAN) tablet 4 mg  4 mg Oral Q6H PRN Standley Brooking, MD       Or   . ondansetron [facility]) injection 4 mg  4 mg Intravenous Q6H PRN Standley Brooking, MD      . sodium chloride 0.9 % injection 3 mL  3 mL Intravenous Q12H Standley Brooking, MD   3 mL at 03/23/15 2204  . sodium chloride 0.9 % injection 3 mL  3 mL Intravenous PRN Standley Brooking, MD       Do not use this list as official medication orders. Please verify with discharge summary.  Discharge Medications:   Medication List    ASK your doctor about these medications        atorvastatin 80 MG tablet  Commonly known as:  LIPITOR  Take 1 tablet (80 mg total) by mouth daily at 6 PM.     B-12 1000 MCG Tbcr  Take 1 tablet by mouth 2 (two) times daily.     bumetanide 2 MG tablet  Commonly known as:  BUMEX  Take 1 mg by mouth daily as needed (for fluid).     carvedilol 3.125 MG tablet  Commonly known as:  COREG  TAKE 1 TABLET BY MOUTH TWICE DAILY WITH MEALS.     donepezil 10 MG tablet  Commonly known as:  ARICEPT  Take 10 mg by mouth daily after supper.     ELIQUIS 5 MG Tabs tablet  Generic drug:  apixaban  Take 5 mg by mouth 2 (two) times daily.     glimepiride 2 MG tablet  Commonly known as:  AMARYL  Take 2 mg by mouth 2 (two) times daily.     lisinopril 5 MG tablet  Commonly known as:  PRINIVIL,ZESTRIL  Take 1 tablet (5 mg total) by mouth daily.     loperamide 2 MG capsule  Commonly known as:  IMODIUM  Take 2 mg by mouth as needed for diarrhea or loose stools.     metFORMIN 500 MG tablet  Commonly known as:  GLUCOPHAGE  TAKE (1) TABLET BY MOUTH TWICE DAILY.     multivitamin with minerals Tabs tablet  Take 1 tablet by mouth daily.     nitroGLYCERIN 0.4 MG SL tablet  Commonly known as:  NITROSTAT  Place 1 tablet (0.4 mg total) under the tongue every 5 (five) minutes as needed for chest pain.     traMADol 50 MG tablet  Commonly known as:  ULTRAM  Take 50 mg by mouth 3 (three) times daily as needed. For pain     vitamin C 1000 MG tablet  Take 1,500 mg by mouth daily.      Vitamin D 2000 UNITS Caps  Take 1 capsule by mouth daily after supper.        Relevant  Imaging Results:  Relevant Lab Results:  Recent Labs    Additional Information  (Pt consult has  been placed for patient)  [name], Bienvenido Proehl D, LCSW  [phone]

## 2015-03-24 NOTE — Progress Notes (Signed)
PT IS ALERT.KNOWS HE IS IN THE HOSPITAL. WIFE AT SON AT BEDSIDE. IV PATENT. HR IN NSR. O2 AT 2L/MIN VIA . BREATH SOUNDS ARE DIMINISHED. PT TRANSFERRING TO ROOM 306. [facility]. PT WILL EAT LUNCH FIRST. .Marland Kitchen

## 2015-03-24 NOTE — Clinical Social Work Note (Signed)
Clinical Social Work Assessment  Patient Details  Name: George Cook MRN: [id number] Date of Birth: 22-May-1928  Date of referral:  03/24/15               Reason for consult:  Facility Placement                Permission sought to share information with:    Permission granted to share information::     Name::        Agency::     Relationship::     Contact Information:     Housing/Transportation Living arrangements for the past 2 months:    Source of Information:  Adult Children, Spouse Patient Interpreter Needed:  None Criminal Activity/Legal Involvement Pertinent to Current Situation/Hospitalization:  No - Comment as needed Significant Relationships:  Adult Children, Spouse Lives with:  Spouse Do you feel safe going back to the place where you live?  Yes Need for family participation in patient care:  Yes (Comment)  Care giving concerns:  Patient's wife agrees that patient's care has gotten to a point where his needs are too great for her to manage.    Social Worker assessment / plan:  Patient's wife, George Cook and son George Cook were at bedside.  Mrs. Demarais advised that she has been patient's primary caregiver, "doing everything" for patient regarding his ADL.  She advise that she bathes patient, feeds him and shaves him. Patient's son indicated that patient has fallen 4-5 times in the past weeks.  He stated that patient is both incontinent with his urine and bowels.  Patient's son advised that patient's ambulates with a walker but he has gotten to a point where he can't guide the walker. He stated that his difficulty in managing his walker has been worsening over the past month.  CSW discussed SNF and provided a SNF list. Patient's family advised that they were interested in [facility] as it was close to their home or [facility]. Patient's son, George Cook, requested that CSW contact his brother George Cook to discuss. CSW contacted George Cook, who advised that he was agreeable to go to SNF.  George Cook inquired  about patient's pension going to the facility should his stay become long term. CSW advised that this was a question that he would need to discuss with the benefits department where the pension came from.  CSW advised that if patient was authorized for placement, his [facility] Medicare would be what paid for the rehab for the first 20 days of rehab.   Employment status:    Nurse, adult PT Recommendations:  Not assessed at this time Information / Referral to community resources:     Patient/Family's Response to care:  Family is agreeable for patient to go to SNF.  Patient/Family's Understanding of and Emotional Response to Diagnosis, Current Treatment, and Prognosis: Family understands patient's diagnosis, treatment and prognosis and realize that patient needs can be most appropriately met at this time in a SNF.   Emotional Assessment Appearance:  Appears stated age Attitude/Demeanor/Rapport:   (Cooperative) Affect (typically observed):  Calm Orientation:  Oriented to Self Alcohol / Substance use:  Not Applicable Psych involvement (Current and /or in the community):  No (Comment)  Discharge Needs  Concerns to be addressed:  Discharge Planning Concerns Readmission within the last 30 days:  No Current discharge risk:  None Barriers to Discharge:  No Barriers Identified   Ihor Gully, LCSW 03/24/2015, 1:16 PM [phone]

## 2015-03-24 NOTE — Evaluation (Signed)
Physical Therapy Evaluation Patient Details Name: George Cook Forand MRN: [id number] DOB: [birth year] Today's Date: 03/24/2015   History of Present Illness  Pt is an 81 year old male admitted for treatment of pneumonia.  He has a hx of DM, PE, stroke and dementia.  He lives with his wife who assists him with most ADLs.  Recently, pt has had falls at home.  He normally uses a walker for gait and had been able to use it with no assist.  Clinical Impression   Pt was seen for evaluation.  He was found to be alert and very pleasant/jovial.  His memory is very poor and he was somewhat disoriented but was able to follow all directions.  He is on supplemental O2 at 2 Cook/min but is still dyspneic even though O2 sat=97%.  He is found to be generally deconditioned with decreased dynamic balance.  He is somewhat impulsive and tends to move very quickly if not being watched carefully.  Because he is such a high fall risk, I am recommending SNF at d/c.  Wife is very much in agreement.    Follow Up Recommendations SNF    Equipment Recommendations  None recommended by PT    Recommendations for Other Services       Precautions / Restrictions Precautions Precautions: Fall Restrictions Weight Bearing Restrictions: No      Mobility  Bed Mobility Overal bed mobility: Needs Assistance Bed Mobility: Supine to Sit     Supine to sit: Mod assist     General bed mobility comments: pt became somewhat disoriented when trying to sit up and began to take off his gown...he was calmed and then needed assist to transfer his weight anteriorly  Transfers Overall transfer level: Needs assistance Equipment used: Rolling walker (2 wheeled) Transfers: Sit to/from Stand Sit to Stand: Min guard         General transfer comment: very impetuous with standing and needs close guarding  Ambulation/Gait Ambulation/Gait assistance: Min guard Ambulation Distance (Feet): 10 Feet (x 2 laps) Assistive device: Rolling  walker (2 wheeled) Gait Pattern/deviations: WFL(Within Functional Limits)   Gait velocity interpretation: <1.8 ft/sec, indicative of risk for recurrent falls General Gait Details: pt needed assist to guide the walker correctly  Stairs            Wheelchair Mobility    Modified Rankin (Stroke Patients Only)       Balance Overall balance assessment: Needs assistance Sitting-balance support: No upper extremity supported;Feet supported Sitting balance-Leahy Scale: Good     Standing balance support: Bilateral upper extremity supported Standing balance-Leahy Scale: Fair Standing balance comment: easily falls to either side                             Pertinent Vitals/Pain Pain Assessment: No/denies pain    Home Living Family/patient expects to be discharged to:: Skilled nursing facility Living Arrangements: Spouse/significant other                    Prior Function Level of Independence: Needs assistance   Gait / Transfers Assistance Needed: transfers independently and able to walk functional distances in the home using a walker and no assistance  ADL's / Homemaking Assistance Needed: assist with all grooming, wife does all household activities        Hand Dominance   Dominant Hand: Right    Extremity/Trunk Assessment   Upper Extremity Assessment: Generalized weakness  Lower Extremity Assessment: Generalized weakness (more deconditioning than weakness)      Cervical / Trunk Assessment: Kyphotic  Communication   Communication: No difficulties  Cognition Arousal/Alertness: Awake/alert Behavior During Therapy: WFL for tasks assessed/performed Overall Cognitive Status: History of cognitive impairments - at baseline (he knew that he was in a hospital but unaware of which one.)                      General Comments      Exercises        Assessment/Plan    PT Assessment Patient needs continued PT services  PT  Diagnosis Difficulty walking;Generalized weakness   PT Problem List Decreased strength;Decreased activity tolerance;Decreased balance;Decreased mobility;Decreased cognition;Decreased knowledge of use of DME;Cardiopulmonary status limiting activity  PT Treatment Interventions Gait training;Functional mobility training;Therapeutic exercise   PT Goals (Current goals can be found in the Care Plan section) Acute Rehab PT Goals Patient Stated Goal: wife would like for him to get stronger before going home PT Goal Formulation: With family Time For Goal Achievement: 04/07/15 Potential to Achieve Goals: Fair    Frequency Min 3X/week   Barriers to discharge   none    Co-evaluation               End of Session Equipment Utilized During Treatment: Gait belt;Oxygen Activity Tolerance: Patient tolerated treatment well Patient left: in chair;with call bell/phone within reach;with family/visitor present;with nursing/sitter in room Nurse Communication: Mobility status         Time: 1610-9604 PT Time Calculation (min) (ACUTE ONLY): 32 min   Charges:   PT Evaluation $Initial PT Evaluation Tier I: 1 Procedure     PT G CodesKonrad Penta  PT 03/24/2015, 3:58 PM [phone]

## 2015-03-24 NOTE — Progress Notes (Signed)
PROGRESS NOTE  George Cook EAV:[id number] DOB: Jan 11, 1928 DOA: 03/23/2015 PCP: Dwana Melena, MD  Summary: 39 yom PMH dementia, DM, stroke presented acute onset confusion, restlessness and generalized weakness. Initial evaluation notable for likely RLL pneumonia, lactic acid 3.5. Admitted for sepsis, CAP.  Assessment/Plan: 1. CAP with early sepsis. Appears improved, hemodynamically stable, no hypoxia, exam benign. 2. Acute encephalopathy superimposed on moderate dementia. Improved   3. Suspected COPD, stable. 4. Moderate to severe aortic stenosis, appears asymptomatic. 5. DM type 2, stable. 6. Stroke 11/2014, presumed embolic, now on Eliquis.   Appears improved, plan to continue abx and steroids  Transfer to medical bed/  Discussed with wife at bedside.  Code Status: DNR/DNI DVT prophylaxis: Eliquis  Family Communication:   Disposition Plan: Move to medical bed 10/31.  [name], MD  [facility]  Pager [phone] If 7PM-7AM, please contact night-coverage at [email or web address], password [facility] 03/24/2015, 6:48 AM  LOS: 1 Cook   Consultants:    Procedures:    Antibiotics:    HPI/Subjective: Feels better, although he still has mild SOB. Denies any nausea, vomiting, or pain. Able to eat without difficulty.  Objective: Filed Vitals:   03/24/15 0300 03/24/15 0400 03/24/15 0500 03/24/15 0600  BP: 168/67 169/84 171/75 153/78  Pulse: 58 55 65 63  Temp:  97.5 F (36.4 C)    TempSrc:  Oral    Resp: Height:      Weight:  91 kg (200 lb 9.9 oz)    SpO2: 98% 100% 100% 98%    Intake/Output Summary (Last 24 hours) at 03/24/15 0648 Last data filed at 03/24/15 0600  Gross per 24 hour  Intake  98.83 ml  Output    250 ml  Net -151.17 ml     Filed Weights   03/23/15 1419 03/23/15 1831 03/24/15 0400  Weight: 92.534 kg (204 lb) 89.4 kg (197 lb 1.5 oz) 91 kg (200 lb 9.9 oz)    Exam:  Afebrile, VSS, not hypoxic  General:  Appears calm and  comfortable Eyes: Pupils appear normal. Normal lids, irises & conjunctiva ENT: grossly normal hearing, lips & tongue Cardiovascular: RRR, 3/6 holosystolic murmur. 1+ ankle edema right greater then left  Telemetry: SR, no arrhythmias  Respiratory: Generally clear bilaterally, no w/r/r. Mild increased l respiratory effort with some abdominal breathing. Abdomen: soft, ntnd Musculoskeletal: grossly normal tone BUE/BLE Psychiatric: grossly normal mood and affect, speech fluent and appropriate Neurologic: grossly non-focal.  New data reviewed:  BUN unremarkable   WBC 17.7, on steroids  Hgb 11.0  CBG stable   Pertinent data since admission:  CXR, NAD per radiology. My read RLL opacity suspicious for developing pneumonia.  Pending data:  UC  Scheduled Meds: . apixaban  5 mg Oral BID  . azithromycin  500 mg Intravenous Q24H  . carvedilol  3.125 mg Oral BID WC  . cefTRIAXone (ROCEPHIN)  IV  1 g Intravenous Q24H  . donepezil  10 mg Oral QPC supper  . insulin aspart  0-9 Units Subcutaneous TID WC  . ipratropium-albuterol  3 mL Nebulization Q4H  . methylPREDNISolone (SOLU-MEDROL) injection  60 mg Intravenous Q12H  . sodium chloride  3 mL Intravenous Q12H   Continuous Infusions:   Principal Problem:   CAP (community acquired pneumonia) Active Problems:   Type 2 diabetes mellitus (HCC)   Moderate aortic stenosis   CVA (cerebral infarction)   Sepsis (HCC)   COPD (chronic obstructive pulmonary disease) (HCC)   Acute encephalopathy  Time spent 20 minutes   By signing my name below, I, Zadie CleverlyJessica Augustus attest that this documentation has been prepared under the direction and in the presence of Brendia Sacksaniel Goodrich, MD Electronically signed: Zadie CleverlyJessica Augustus 03/24/2015 9:02am   I personally performed the services described in this documentation. All medical record entries made by the scribe were at my direction. I have reviewed the chart and agree that the record reflects my  personal performance and is accurate and complete. Brendia Sacksaniel Goodrich, MD

## 2015-03-25 ENCOUNTER — Inpatient Hospital Stay
Admission: RE | Admit: 2015-03-25 | Discharge: 2015-04-14 | Disposition: A | Discharge status: Home / Self Care | Payer: Medicare Other | Source: Ambulatory Visit | Attending: Internal Medicine | Admitting: Internal Medicine

## 2015-03-25 DIAGNOSIS — R1310 Dysphagia, unspecified: Principal | ICD-10-CM

## 2015-03-25 LAB — URINE CULTURE

## 2015-03-25 LAB — GLUCOSE, CAPILLARY
GLUCOSE-CAPILLARY: 235 mg/dL — AB (ref 65–99)
Glucose-Capillary: 161 mg/dL — ABNORMAL HIGH (ref 65–99)

## 2015-03-25 MED ORDER — IPRATROPIUM-ALBUTEROL 0.5-2.5 (3) MG/3ML IN SOLN: 3.0000 mL | Freq: Two times a day (BID) | RESPIRATORY_TRACT | Status: DC

## 2015-03-25 MED ORDER — ATORVASTATIN CALCIUM 20 MG PO TABS: 20.0000 mg | ORAL_TABLET | Freq: Every day | ORAL | Status: DC

## 2015-03-25 MED ORDER — AZITHROMYCIN 500 MG PO TABS: 500.0000 mg | ORAL_TABLET | Freq: Every day | ORAL | Status: DC

## 2015-03-25 MED ORDER — PREDNISONE 20 MG PO TABS: 40.0000 mg | ORAL_TABLET | Freq: Every day | ORAL | Status: DC

## 2015-03-25 MED ORDER — PREDNISONE 10 MG PO TABS: ORAL_TABLET | ORAL | Status: DC

## 2015-03-25 MED ORDER — ALBUTEROL SULFATE (2.5 MG/3ML) 0.083% IN NEBU: 2.5000 mg | INHALATION_SOLUTION | RESPIRATORY_TRACT | Status: DC | PRN

## 2015-03-25 MED ORDER — CEFUROXIME AXETIL 500 MG PO TABS: 500.0000 mg | ORAL_TABLET | Freq: Two times a day (BID) | ORAL | Status: DC

## 2015-03-25 MED ORDER — AZITHROMYCIN 250 MG PO TABS: 500.0000 mg | ORAL_TABLET | Freq: Every day | ORAL | Status: DC

## 2015-03-25 MED ORDER — CEFUROXIME AXETIL 250 MG PO TABS: 500.0000 mg | ORAL_TABLET | Freq: Two times a day (BID) | ORAL | Status: DC

## 2015-03-25 MED ORDER — TRAMADOL HCL 50 MG PO TABS: 50.0000 mg | ORAL_TABLET | Freq: Three times a day (TID) | ORAL | Status: DC | PRN

## 2015-03-25 NOTE — Clinical Social Work Placement (Addendum)
   CLINICAL SOCIAL WORK PLACEMENT  NOTE  Date:  03/25/2015  Patient Details  Name: George Cook MRN: [id number] Date of Birth: 18-Sep-1927  Clinical Social Work is seeking post-discharge placement for this patient at the Skilled  Nursing Facility level of care (*CSW will initial, date and re-position this form in  chart as items are completed):  Yes   Patient/family provided with [facility] Clinical Social Work Department's list of facilities offering this level of care within the geographic area requested by the patient (or if unable, by the patient's family).  Yes   Patient/family informed of their freedom to choose among providers that offer the needed level of care, that participate in Medicare, Medicaid or managed care program needed by the patient, have an available bed and are willing to accept the patient.  Yes   Patient/family informed of 's ownership interest [facility] and [facility], as well as of the fact that they are under no obligation to receive care at these facilities.  PASRR submitted to EDS on 03/24/15     PASRR number received on 03/24/15     Existing PASRR number confirmed on       FL2 transmitted to all facilities in geographic area requested by pt/family on 03/24/15     FL2 transmitted to all facilities within larger geographic area on       Patient informed that his/her managed care company has contracts with or will negotiate with certain facilities, including the following:        Yes   Patient/family informed of bed offers received.  [facility]     Physician recommends and patient chooses bed at      [facility] 03[facility].  Patient to be transferred to facility by staff     Patient family notified on 03/25/15 of transfer.  Name of family member notified:  Tyler AasDoris- wife     PHYSICIAN       Additional Comment:  Authorization: [id number]  Next review  03/27/15   RUG level: RVB.  _______________________________________________ Karn CassisStultz, Shanisha Lech Shanaberger, LCSW 03/25/2015, 1:33 PM [phone]

## 2015-03-25 NOTE — Care Management Note (Signed)
Case Management Note  Patient Details  Name: George Cook MRN: [id number] Date of Birth: 1927-09-17  Subjective/Objective:                  Pt is from home, admitted with CAP. PT has recommended SNF, CSW aware.   Action/Plan: Pt dsicharging to SNF today, CSW has arranged for placement, pt is aware and agreeable to DC plan. No CM needs.   Expected Discharge Date:    03/25/2015              Expected Discharge Plan:  Skilled Nursing Facility  In-House Referral:  Clinical Social Work  Discharge planning Services  CM Consult  Post Acute Care Choice:  NA Choice offered to:  NA  DME Arranged:    DME Agency:     HH Arranged:    HH Agency:     Status of Service:  Completed, signed off  Medicare Important Message Given:  N/A - LOS <3 / Initial given by admissions Date Medicare IM Given:    Medicare IM give by:    Date Additional Medicare IM Given:    Additional Medicare Important Message give by:     If discussed at Long Length of Stay Meetings, dates discussed:    Additional Comments:  George Cook, George Ellery Demske, RN 03/25/2015, 1:49 PM

## 2015-03-25 NOTE — Progress Notes (Signed)
Physical Therapy Treatment Patient Details Name: George Cook MRN: [id number] DOB: 08/20/1927 Today's Date: 03/25/2015    History of Present Illness Pt is an 81 year old male admitted for treatment of pneumonia.  He has a hx of DM, PD, stroke and dementia.  He lives with his wife who assists him with most ADLs.  Recently, pt has had falls at home.  He normally uses a walker for gait and had been able to use it with no assist.    PT Comments    Pt alert and very cooperative today, able to follow all directions.  He was on 2 L O2 at time of my visit with O2 sat=96%.  O2 was removed and therapeutic exercise initiated for LE strengthening.  He was dyspneic at rest and with exercise (on and off of supplemental O2).  He tolerated exercise well but only able to do 5 repetitions of most exercises due to muscle fatigue.  He was instructed in use of UEs to assist standing and then in use of walker.  He tends to fall backward upon standing and needed instruction in shifting weight anteriorly.  His gait endurance increased to 9590' today but he needed significant amount of assist to move around obstacles.  Follow Up Recommendations  SNF     Equipment Recommendations  None recommended by PT    Recommendations for Other Services  none     Precautions / Restrictions Precautions Precautions: Fall Restrictions Weight Bearing Restrictions: No    Mobility  Bed Mobility Overal bed mobility: Needs Assistance Bed Mobility: Supine to Sit     Supine to sit: Min assist;HOB elevated     General bed mobility comments: [facility]  Transfers Overall transfer level: Needs assistance Equipment used: Rolling walker (2 wheeled) Transfers: Sit to/from Stand Sit to Stand: Min guard         General transfer comment: pt is much less impetuous today and waits to follow directions  Ambulation/Gait Ambulation/Gait assistance: Min assist Ambulation Distance (Feet): 90  Feet Assistive device: Rolling walker (2 wheeled) Gait Pattern/deviations: WFL(Within Functional Limits)   Gait velocity interpretation: <1.8 ft/sec, indicative of risk for recurrent falls General Gait Details: continues to need a significant amount of assistance to guide the walker   Stairs            Wheelchair Mobility    Modified Rankin (Stroke Patients Only)       Balance Overall balance assessment: Needs assistance Sitting-balance support: No upper extremity supported;Feet supported Sitting balance-Leahy Scale: Normal     Standing balance support: Bilateral upper extremity supported Standing balance-Leahy Scale: Fair Standing balance comment: pt falls backward today...needs cues to keep weight anteriorly shifted into the walker                    Cognition Arousal/Alertness: Awake/alert Behavior During Therapy: WFL for tasks assessed/performed Overall Cognitive Status: History of cognitive impairments - at baseline                      Exercises General Exercises - Lower Extremity Ankle Circles/Pumps: AROM;Both;10 reps;Supine Hip ABduction/ADduction: AAROM;AROM;Both;5 reps;Supine Straight Leg Raises: AROM;AAROM;Both;5 reps;Supine Hip Flexion/Marching: AROM;AAROM;Both;5 reps;Supine    General Comments        Pertinent Vitals/Pain Pain Assessment: No/denies pain    Home Living                      Prior Function  PT Goals (current goals can now be found in the care plan section) Progress towards PT goals: Progressing toward goals    Frequency       PT Plan Current plan remains appropriate    Co-evaluation             End of Session Equipment Utilized During Treatment: Gait belt Activity Tolerance: Patient tolerated treatment well Patient left: in chair;with call bell/phone within reach;with family/visitor present     Time: 1610-9604 PT Time Calculation (min) (ACUTE ONLY): 36 min  Charges:  $Gait  Training: 8-22 mins $Therapeutic Exercise: 8-22 mins                    G CodesKonrad Penta  PT 03/25/2015, 11:16 AM [phone]

## 2015-03-25 NOTE — Progress Notes (Signed)
[facility] Irene LimboGoodrich.  Pt's IV site's D/C'd and WDL.  Pt's VSS.  [facility]  Verbalized understanding.  Pt left floor via WC in stable condition accompanied by NT.

## 2015-03-25 NOTE — Progress Notes (Signed)
PROGRESS NOTE  George Cook [id number] DOB: 1928-02-01 DOA: 03/23/2015 PCP: Dwana MelenaZack Hall, MD  Summary: 89 yom PMH dementia, DM, stroke presented acute onset confusion, restlessness and generalized weakness. Initial evaluation notable for likely RLL pneumonia, lactic acid 3.5. Admitted for sepsis, CAP.  Assessment/Plan: 1. CAP with early sepsis. Continues to improve, remains hemodynamically stable, no hypoxia, and exam benign. 2. Acute encephalopathy superimposed on moderate dementia. Resolved 3. Suspected COPD, stable. 4. Moderate to severe aortic stenosis, appears asymptomatic. Stable. 5. DM type 2, remains stable. 6. Stroke 11/2014, presumed embolic, now on Eliquis.   Overall continues to improve, change to oral abx.   Discharge to SNF today  Code Status: DNR/DNI DVT prophylaxis: Eliquis  Family Communication:  Wife and sisters bedside. Discussed with patient who understands and has no concerns at this time. Disposition Plan: Anticipate discharge   Brendia Sacksaniel Goodrich, MD  [facility]  Pager [phone] If 7PM-7AM, please contact night-coverage at [email or web address], password [email or web address] 03/25/2015, 8:30 AM  LOS: 2 days   Consultants:    Procedures:    Antibiotics:  Ceftriaxone 10/30>>10/31  Azithromycin 10/30>>11/3  Ceftin 11/1>>11/5  HPI/Subjective: Feels good. Denies chest pain, shortness of breath, n/v/d, or abd pain. Able to eat and ambulate without difficulty.  Objective: Filed Vitals:   03/24/15 1209 03/24/15 2047 03/24/15 2156 03/25/15 0501  BP:   155/81 174/89  Pulse:   70 70  Temp: 97.1 F (36.2 C)  98.1 F (36.7 C) 97.4 F (36.3 C)  TempSrc: Axillary  Oral Oral  Resp:   22 22  Height:      Weight:      SpO2:  94% 96% 98%    Intake/Output Summary (Last 24 hours) at 03/25/15 0830 Last data filed at 03/25/15 [id number]  Gross per 24 hour  Intake    796 ml  Output    300 ml  Net    496 ml     Filed Weights   03/23/15 1419 03/23/15 1831 03/24/15 0400   Weight: 92.534 kg (204 lb) 89.4 kg (197 lb 1.5 oz) 91 kg (200 lb 9.9 oz)    Exam:  Afebrile, VSS, not hypoxic. Sitting up in chair talking with family.   General:  Appears calm and comfortable Eyes: PERRL, normal lids, irises   ENT: grossly normal hearing, lips & tongue Neck: no LAD, masses or thyromegaly Cardiovascular: RRR, no m/r/g. No LE edema. Respiratory: CTA bilaterally, no w/r/r. Normal respiratory effort. Abdomen: soft, ntnd Musculoskeletal: grossly normal tone BUE/BLE Psychiatric: grossly normal mood and affect, speech fluent and appropriate Neurologic: grossly non-focal.  New data reviewed:  CBG remains stable   Pertinent data since admission:  CXR, NAD per radiology. My read RLL opacity suspicious for developing pneumonia.  Pending data:  UC  Scheduled Meds: . apixaban  5 mg Oral BID  . azithromycin  500 mg Intravenous Q24H  . carvedilol  3.125 mg Oral BID WC  . cefTRIAXone (ROCEPHIN)  IV  1 g Intravenous Q24H  . donepezil  10 mg Oral QPC supper  . insulin aspart  0-9 Units Subcutaneous TID WC  . ipratropium-albuterol  3 mL Nebulization BID  . methylPREDNISolone (SOLU-MEDROL) injection  60 mg Intravenous Q12H  . sodium chloride  3 mL Intravenous Q12H   Continuous Infusions:   Principal Problem:   CAP (community acquired pneumonia) Active Problems:   Type 2 diabetes mellitus (HCC)   Moderate aortic stenosis   CVA (cerebral infarction)   Sepsis (HCC)   COPD (chronic obstructive pulmonary disease) (  HCC)   Acute encephalopathy   By signing my name below, I, Zadie Cleverly attest that this documentation has been prepared under the direction and in the presence of [name], MD Electronically signed: Zadie Cleverly 03/25/2015 11:50am  I personally performed the services described in this documentation. All medical record entries made by the scribe were at my direction. I have reviewed the chart and agree that the record reflects my personal  performance and is accurate and complete. [name], MD

## 2015-03-25 NOTE — Discharge Summary (Signed)
Physician Discharge Summary  HARWOOD NALL KVQ:[id number] DOB: 13-Jun-1927 DOA: 03/23/2015  PCP: Dwana Melena, MD  Admit date: 03/23/2015 Discharge date: 03/25/2015  Recommendations for Outpatient Follow-up:  1. Follow-up resolution of pneumonia.    Follow-up Information    Follow up with Dwana Melena, MD.   Specialty:  Internal Medicine   Contact information:   [address] [place] [address] [phone]        Discharge Diagnoses:  1. CAP with early sepsis.  2. Acute encephalopathy superimposed on moderate dementia.  3. Suspected COPD. 4. Moderate to severe aortic stenosis. 5. DM type 2.  Discharge Condition: Improved  Disposition: Discharge to SNF   Diet recommendation: Heart healthy   Filed Weights   03/23/15 1419 03/23/15 1831 03/24/15 0400  Weight: 92.534 kg (204 lb) 89.4 kg (197 lb 1.5 oz) 91 kg (200 lb 9.9 oz)    History of present illness:  9 yom PMH dementia, DM, stroke presented acute onset confusion, restlessness and generalized weakness. Initial evaluation notable for likely RLL pneumonia, lactic acid 3.5. Admitted for sepsis, CAP.  Hospital Course:  Community acquired pneumonia with early sepsis rapidly improved. Acute encephalopathy superimposed on dementia has resolved with treatment. PT has evaluated and recommended SNF upon discharge. Suspected COPD, moderate to severe aortic stenosis and all other issues remained stable.  Individual issues as below:    1. CAP with early sepsis. Continues to improve, remains hemodynamically stable, no hypoxia, and exam benign. 2. Acute encephalopathy superimposed on moderate dementia. Resolved 3. Suspected COPD, stable. 4. Moderate to severe aortic stenosis, appears asymptomatic. Stable. 5. DM type 2, remains stable. 6. Stroke 11/2014, presumed embolic, now on Eliquis.  Consultants:  PT-Recommended SNF upon discharge.   Procedures  None      Discharge Instructions     Current Discharge  Medication List    START taking these medications   Details  albuterol (PROVENTIL) (2.5 MG/3ML) 0.083% nebulizer solution Take 3 mLs (2.5 mg total) by nebulization every 2 (two) hours as needed for wheezing or shortness of breath.    azithromycin (ZITHROMAX) 500 MG tablet Take 1 tablet (500 mg total) by mouth daily. Last dose at dinner 11/3    cefUROXime (CEFTIN) 500 MG tablet Take 1 tablet (500 mg total) by mouth 2 (two) times daily with a meal. Last day 11/5    ipratropium-albuterol (DUONEB) 0.5-2.5 (3) MG/3ML SOLN Take 3 mLs by nebulization 2 (two) times daily.    predniSONE (DELTASONE) 10 MG tablet Take 40 mg by mouth daily for 3 days, then take 20 mg by mouth daily for 3 days, then take 10 mg by mouth daily for 3 days, then stop.      CONTINUE these medications which have CHANGED   Details  atorvastatin (LIPITOR) 20 MG tablet Take 1 tablet (20 mg total) by mouth daily at 6 PM.    traMADol (ULTRAM) 50 MG tablet Take 1 tablet (50 mg total) by mouth 3 (three) times daily as needed for moderate pain. For pain Qty: 20 tablet, Refills: 0      CONTINUE these medications which have NOT CHANGED   Details  apixaban (ELIQUIS) 5 MG TABS tablet Take 5 mg by mouth 2 (two) times daily.    Ascorbic Acid (VITAMIN C) 1000 MG tablet Take 1,500 mg by mouth daily.     bumetanide (BUMEX) 2 MG tablet Take 1 mg by mouth daily as needed (for fluid).     carvedilol (COREG) 3.125 MG tablet TAKE 1  TABLET BY MOUTH TWICE DAILY WITH MEALS. Qty: 180 tablet, Refills: 3    Cholecalciferol (VITAMIN D) 2000 UNITS CAPS Take 1 capsule by mouth daily after supper.    Cyanocobalamin (B-12) 1000 MCG TBCR Take 1 tablet by mouth 2 (two) times daily.    donepezil (ARICEPT) 10 MG tablet Take 10 mg by mouth daily after supper.     glimepiride (AMARYL) 2 MG tablet Take 2 mg by mouth 2 (two) times daily.     lisinopril (PRINIVIL,ZESTRIL) 5 MG tablet Take 1 tablet (5 mg total) by mouth daily. Qty: 30 tablet, Refills:  3    loperamide (IMODIUM) 2 MG capsule Take 2 mg by mouth as needed for diarrhea or loose stools.    metFORMIN (GLUCOPHAGE) 500 MG tablet TAKE (1) TABLET BY MOUTH TWICE DAILY. Qty: 180 tablet, Refills: 0    Multiple Vitamin (MULTIVITAMIN WITH MINERALS) TABS tablet Take 1 tablet by mouth daily.    nitroGLYCERIN (NITROSTAT) 0.4 MG SL tablet Place 1 tablet (0.4 mg total) under the tongue every 5 (five) minutes as needed for chest pain. Qty: 25 tablet, Refills: 3       Allergies  Allergen Reactions  . Aspirin Hives and Rash    The results of significant diagnostics from this hospitalization (including imaging, microbiology, ancillary and laboratory) are listed below for reference.    Significant Diagnostic Studies: Dg Chest Portable 1 View  03/23/2015  CLINICAL DATA:  Shortness of breath, weakness EXAM: PORTABLE CHEST 1 VIEW COMPARISON:  12/13/2014 FINDINGS: Lungs are essentially clear. No focal consolidation. No pleural effusion or pneumothorax. Cardiomegaly. IMPRESSION: No evidence of acute cardiopulmonary disease. Electronically Signed   By: Charline Bills M.D.   On: 03/23/2015 14:51    Microbiology: Recent Results (from the past 240 hour(s))  Culture, blood (routine x 2)     Status: None (Preliminary result)   Collection Time: 03/23/15  4:04 PM  Result Value Ref Range Status   Specimen Description RIGHT ANTECUBITAL  Final   Special Requests BOTTLES DRAWN AEROBIC AND ANAEROBIC 6CC  Final   Culture NO GROWTH 2 DAYS  Final   Report Status PENDING  Incomplete  Culture, blood (routine x 2)     Status: None (Preliminary result)   Collection Time: 03/23/15  4:18 PM  Result Value Ref Range Status   Specimen Description LEFT ANTECUBITAL  Final   Special Requests BOTTLES DRAWN AEROBIC AND ANAEROBIC 6CC EACH  Final   Culture NO GROWTH 2 DAYS  Final   Report Status PENDING  Incomplete  MRSA PCR Screening     Status: None   Collection Time: 03/23/15  6:20 PM  Result Value Ref Range  Status   MRSA by PCR NEGATIVE NEGATIVE Final    Comment:        The GeneXpert MRSA Assay (FDA approved for NASAL specimens only), is one component of a comprehensive MRSA colonization surveillance program. It is not intended to diagnose MRSA infection nor to guide or monitor treatment for MRSA infections.   Urine culture     Status: None (Preliminary result)   Collection Time: 03/24/15 12:30 AM  Result Value Ref Range Status   Specimen Description URINE, CLEAN CATCH  Final   Special Requests NONE  Final   Culture   Final    CULTURE REINCUBATED FOR BETTER GROWTH Performed at [facility]    Report Status PENDING  Incomplete     Labs: Basic Metabolic Panel:  Recent Labs Lab 03/23/15 1452 03/24/15 0514  NA 140 137  K 3.7 4.8  CL 102 104  CO2 26 25  GLUCOSE 72 239*  BUN 19 18  CREATININE 0.84 0.83  CALCIUM 9.2 8.1*   CBC:  Recent Labs Lab 03/23/15 1452 03/24/15 0514  WBC 12.6* 17.7*  HGB 12.5* 11.0*  HCT 38.0* 33.8*  MCV 85.8 86.2  PLT 197 178   Cardiac Enzymes:  Recent Labs Lab 03/23/15 1452  TROPONINI <0.03       Recent Labs  12/13/14 1150 03/23/15 1452  BNP 187.0* 183.0*   CBG:  Recent Labs Lab 03/24/15 1144 03/24/15 1630 03/24/15 2140 03/25/15 0732 03/25/15 1114  GLUCAP 240* 247* 224* 161* 235*    Principal Problem:   CAP (community acquired pneumonia) Active Problems:   Type 2 diabetes mellitus (HCC)   Moderate aortic stenosis   CVA (cerebral infarction)   Sepsis (HCC)   COPD (chronic obstructive pulmonary disease) (HCC)   Acute encephalopathy   Time coordinating discharge: 25 minutes   Signed:  Brendia Sacksaniel Oseias Horsey, MD [facility] 03/25/2015, 11:56 AM    By signing my name below, I, Zadie CleverlyJessica Augustus attest that this documentation has been prepared under the direction and in the presence of Brendia Sacksaniel Seleni Meller, MD Electronically signed: Zadie CleverlyJessica Augustus  03/25/2015   I personally performed the services  described in this documentation. All medical record entries made by the scribe were at my direction. I have reviewed the chart and agree that the record reflects my personal performance and is accurate and complete. Brendia Sacksaniel Teofil Maniaci, MD

## 2015-03-25 NOTE — Care Management Important Message (Signed)
Important Message  Patient Details  Name: George Cook MRN: [id number] Date of Birth: Apr 25, 1928   Medicare Important Message Given:  N/A - LOS <3 / Initial given by admissions    Malcolm Metrohildress, Leniya Breit Demske, RN 03/25/2015, 1:48 PM

## 2015-03-25 NOTE — Progress Notes (Signed)
Inpatient Diabetes Program Recommendations  AACE/ADA: New Consensus Statement on Inpatient Glycemic Control (2015)  Target Ranges:  Prepandial:   less than 140 mg/dL      Peak postprandial:   less than 180 mg/dL (1-2 hours)      Critically ill patients:  140 - 180 mg/dL  Results for Arletha GrippeMOORE, Ardit L (MRN [id number]) as of 03/25/2015 09:24  Ref. Range 03/24/2015 07:17 03/24/2015 11:44 03/24/2015 16:30 03/24/2015 21:40 03/25/2015 07:32  Glucose-Capillary Latest Ref Range: 65-99 mg/dL 409194 (H) 811240 (H) 914247 (H) 224 (H) 161 (H)   Review of Glycemic Control  Current orders for Inpatient glycemic control: Novolog 0-9 units TID with meals  Inpatient Diabetes Program Recommendations: Correction (SSI): Please consider adding Novolog bedtime correction scale. Insulin - Meal Coverage: While inpatient and ordered steroids, please consider ordering Novolog 4 units TID with meals for meal coverage.  Thanks, Orlando PennerMarie Crimson Dubberly, RN, MSN, CDE Diabetes Coordinator Inpatient Diabetes Program [phone] (Team Pager from 8am to 5pm) [phone] ([facility] office) [phone] [facility] office) [phone] [facility] office)

## 2015-03-26 ENCOUNTER — Non-Acute Institutional Stay (SKILLED_NURSING_FACILITY): Payer: Medicare Other | Admitting: Internal Medicine

## 2015-03-26 DIAGNOSIS — R260 Ataxic gait: Secondary | ICD-10-CM

## 2015-03-26 DIAGNOSIS — I350 Nonrheumatic aortic (valve) stenosis: Secondary | ICD-10-CM | POA: Diagnosis not present

## 2015-03-26 DIAGNOSIS — J449 Chronic obstructive pulmonary disease, unspecified: Secondary | ICD-10-CM | POA: Diagnosis not present

## 2015-03-26 DIAGNOSIS — E1142 Type 2 diabetes mellitus with diabetic polyneuropathy: Secondary | ICD-10-CM | POA: Diagnosis not present

## 2015-03-26 DIAGNOSIS — J189 Pneumonia, unspecified organism: Secondary | ICD-10-CM | POA: Diagnosis not present

## 2015-03-26 LAB — LEGIONELLA PNEUMOPHILA SEROGP 1 UR AG: L. PNEUMOPHILA SEROGP 1 UR AG: POSITIVE — AB

## 2015-03-26 NOTE — Progress Notes (Signed)
Patient ID: George Cook, male   DOB: 02/07/1928, 81 y.o.   MRN: [id number]     Facility; [facility] Chief complaint; admission to SNF [facility] from 10/30 to 03/25/15  History;  this is an 81 year old man with the a history of COPD [receiving nebulizers at home]. He apparently also has some degree of dementia. Family states that they noted increasing respiratory distress at home and by the time he came to the hospital he was febrile with a lactic acid of 3.5. He was admitted for sepsis community-acquired pneumonia. He has been discharged on Ceftin and Zithromax completing the Zithromax on 11/3 in the Ceftin on 11/5. Of note his discharge white count on 10/31 was up to 17.7 which was higher than on 10/30. His urine culture showed multiple species blood cultures were negative.  CBC Latest Ref Rng 03/24/2015 03/23/2015 12/13/2014  WBC 4.0 - 10.5 K/uL 17.7(H) 12.6(H) 6.3  Hemoglobin 13.0 - 17.0 g/dL 11.0(L) 12.5(L) 12.2(L)  Hematocrit 39.0 - 52.0 % 33.8(L) 38.0(L) 37.2(L)  Platelets 150 - 400 K/uL 178 197 183   BMP Latest Ref Rng 03/24/2015 03/23/2015 12/13/2014  Glucose 65 - 99 mg/dL 409(W239(H) 72 119(J126(H)  BUN 6 - 20 mg/dL 18 19 47(W21(H)  Creatinine 0.61 - 1.24 mg/dL 2.950.83 6.210.84 3.080.92  Sodium 135 - 145 mmol/L 137 140 139  Potassium 3.5 - 5.1 mmol/L 4.8 3.7 4.3  Chloride 101 - 111 mmol/L 104 102 104  CO2 22 - 32 mmol/L 25 26 28   Calcium 8.9 - 10.3 mg/dL 8.1(L) 9.2 9.4    Past Medical History  Diagnosis Date  . HTN (hypertension)   . DM (diabetes mellitus) (HCC)   . Carotid artery occlusion     a. 05/2012 U/S Bilat ICA 20-39%.  . Obesity   . Stroke [facility](HCC)     a. 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 01/2009 TEE: EF 60-65%, mild MR, no LA/LAA thrombus.  . Macular degeneration   . Hiatal hernia   . Nephrolithiasis     Past Surgical History  Procedure Laterality Date  . Finger surgery Left     pinky, after saw cut it off  . Cataract extraction w/phaco Right 10/02/2012   Procedure: CATARACT EXTRACTION PHACO AND INTRAOCULAR LENS PLACEMENT (IOC);  Surgeon: Gemma PayorKerry Hunt, MD;  Location: [facility];  Service: Ophthalmology;  Laterality: Right;  CDE 12.78  . Eye surgery    . Cataract extraction w/phaco Left 10/30/2012    Procedure: CATARACT EXTRACTION PHACO AND INTRAOCULAR LENS PLACEMENT (IOC);  Surgeon: Gemma PayorKerry Hunt, MD;  Location: [facility];  Service: Ophthalmology;  Laterality: Left;  CDE: 19.10  . Left heart catheterization with coronary angiogram Bilateral 03/08/2013    Procedure: LEFT HEART CATHETERIZATION WITH CORONARY ANGIOGRAM;  Surgeon: Laurey Moralealton S McLean, MD;  [facility];  Service: Cardiovascular;  Laterality: Bilateral;  . Tee without cardioversion N/A 12/16/2014    Procedure: TRANSESOPHAGEAL ECHOCARDIOGRAM (TEE);  Surgeon: Antoine PocheJonathan F Branch, MD;  Location: [facility];  Service: Cardiology;  Laterality: N/A;  will need 40 minutes to clean scope between procedures   Current Outpatient Prescriptions on File Prior to Visit  Medication Sig Dispense Refill  . albuterol (PROVENTIL) (2.5 MG/3ML) 0.083% nebulizer solution Take 3 mLs (2.5 mg total) by nebulization every 2 (two) hours as needed for wheezing or shortness of breath.    Marland Kitchen. apixaban (ELIQUIS) 5 MG TABS tablet Take 5 mg by mouth 2 (two) times daily.    . Ascorbic Acid (VITAMIN  C) 1000 MG tablet Take 1,500 mg by mouth daily.     Marland Kitchen atorvastatin (LIPITOR) 20 MG tablet Take 1 tablet (20 mg total) by mouth daily at 6 PM.    . azithromycin (ZITHROMAX) 500 MG tablet Take 1 tablet (500 mg total) by mouth daily. Last dose at dinner 11/3    . bumetanide (BUMEX) 2 MG tablet Take 1 mg by mouth daily as needed (for fluid).     . carvedilol (COREG) 3.125 MG tablet TAKE 1 TABLET BY MOUTH TWICE DAILY WITH MEALS. 180 tablet 3  . cefUROXime (CEFTIN) 500 MG tablet Take 1 tablet (500 mg total) by mouth 2 (two) times daily with a meal. Last day 11/5    . Cholecalciferol (VITAMIN D) 2000 UNITS CAPS Take 1 capsule by mouth daily  after supper.    . Cyanocobalamin (B-12) 1000 MCG TBCR Take 1 tablet by mouth 2 (two) times daily.    Marland Kitchen donepezil (ARICEPT) 10 MG tablet Take 10 mg by mouth daily after supper.     Marland Kitchen glimepiride (AMARYL) 2 MG tablet Take 2 mg by mouth 2 (two) times daily.     Marland Kitchen ipratropium-albuterol (DUONEB) 0.5-2.5 (3) MG/3ML SOLN Take 3 mLs by nebulization 2 (two) times daily.    Marland Kitchen lisinopril (PRINIVIL,ZESTRIL) 5 MG tablet Take 1 tablet (5 mg total) by mouth daily. 30 tablet 3  . loperamide (IMODIUM) 2 MG capsule Take 2 mg by mouth as needed for diarrhea or loose stools.    . metFORMIN (GLUCOPHAGE) 500 MG tablet TAKE (1) TABLET BY MOUTH TWICE DAILY. 180 tablet 0  . Multiple Vitamin (MULTIVITAMIN WITH MINERALS) TABS tablet Take 1 tablet by mouth daily.    . nitroGLYCERIN (NITROSTAT) 0.4 MG SL tablet Place 1 tablet (0.4 mg total) under the tongue every 5 (five) minutes as needed for chest pain. 25 tablet 3  . predniSONE (DELTASONE) 10 MG tablet Take 40 mg by mouth daily for 3 days, then take 20 mg by mouth daily for 3 days, then take 10 mg by mouth daily for 3 days, then stop.    . traMADol (ULTRAM) 50 MG tablet Take 1 tablet (50 mg total) by mouth 3 (three) times daily as needed for moderate pain. For pain 20 tablet 0   SOCIAL; lives with his wife. Very limited activity at home. Uses a walker. Sounds as though he needs a lot of basic ADL assistance  reports that he quit smoking about 23 years ago. His smoking use included Cigarettes. He started smoking about 65 years ago. He has a 120 pack-year smoking history. He has never used smokeless tobacco. He reports that he does not drink alcohol or use illicit drugs.  family history includes Coronary artery disease in his brother and father; Diabetes in his mother and sister; Emphysema in his mother; Heart disease in his brother and father; Hypertension in his mother and sister; Stroke in his mother and sister.  Review of systems; mostly from his wife and son are  present HEENT; no clear history of visual loss Respiratory; short of breath on exertion does not use oxygen at home Cardiac no complaints of chest pain GI no abdominal pain or diarrhea GU no dysuria Musculoskeletal no joint or back pain Neurologic; according to his family his legs "won't support him". He has had multiple falls Mental status; notable for memory loss  Physical examination Gen. patient is not in any distress Vitals; temperature 97.6 pulse 77 respirations 20 blood pressure 175/70 repeated manually at 168/80  Respiratory; clear air entry bilaterally, mild end expiratory wheezing no crackles mild accessory muscle use Cardiac heart sounds are soft there is a 3/6 systolic ejection murmur radiating into both carotids JVP is not elevated no coccyx edema Abdomen; no liver no spleen no masses no tenderness GU bladder is not distended there is no CVA tenderness Extremities; some degree of venous stasis there is a lesion on his left anterior leg about the size of a $0.50 piece with discoloration apparently this is unchanged for 3-4 years area day been told that this is related to his "diabetes" Neurologic; strength in his legs is 4+ out of 5 diffusely distally it is 5 out of 5 reflexes are symmetric toes are downgoing Gait; he is able to bring himself to a standing position wide-based unsteady gait presumably diabetic neuropathy Mental status; vague about details looks to his wife. No evidence of depression or delirium  Impression/plan #1 community-acquired pneumonia; interestingly I cannot find a chest x-ray that showed this. Discharged with a white count of 17.7, higher than when he was admitted #2 presume COPD. I would agree with this he was apparently on nebulizers at home. He is on a taper of prednisone. #3 moderate to severe calcific aortic stenosis with a recent echocardiogram reviewed #4 type 2 diabetes on Amaryl and metformin #5 dementia on Aricept; previous MRI done earlier  this year showed multiple cerebral infarctions presumably this is the etiology. #6 on anticoagulant, Eliquis for a presumed embolic infarctions. #7 hypertension on lisinopril #8 gait ataxia probably secondary to diabetic neuropathy  The patient appears to have a borderline respiratory status he is Which increases with even minimal exertion. I am also somewhat concerned about the elevated white count coming out of the hospital which actually was increased versus when he arrived. His sepsis was based on an elevated lactic acid level

## 2015-03-27 ENCOUNTER — Encounter (HOSPITAL_COMMUNITY)
Admission: AD | Admit: 2015-03-27 | Discharge: 2015-03-27 | Disposition: A | Discharge status: Home / Self Care | Payer: Medicare Other | Source: Skilled Nursing Facility | Attending: Internal Medicine | Admitting: Internal Medicine

## 2015-03-27 LAB — CBC WITH DIFFERENTIAL/PLATELET
BASOS ABS: 0 10*3/uL (ref 0.0–0.1)
BASOS PCT: 0 %
Eosinophils Absolute: 0.1 10*3/uL (ref 0.0–0.7)
Eosinophils Relative: 1 %
HEMATOCRIT: 40.1 % (ref 39.0–52.0)
HEMOGLOBIN: 13.3 g/dL (ref 13.0–17.0)
Lymphocytes Relative: 18 %
Lymphs Abs: 2 10*3/uL (ref 0.7–4.0)
MCH: 28.3 pg (ref 26.0–34.0)
MCHC: 33.2 g/dL (ref 30.0–36.0)
MCV: 85.3 fL (ref 78.0–100.0)
MONOS PCT: 6 %
Monocytes Absolute: 0.6 10*3/uL (ref 0.1–1.0)
NEUTROS ABS: 8.3 10*3/uL — AB (ref 1.7–7.7)
NEUTROS PCT: 75 %
Platelets: 284 10*3/uL (ref 150–400)
RBC: 4.7 MIL/uL (ref 4.22–5.81)
RDW: 13.6 % (ref 11.5–15.5)
WBC: 11 10*3/uL — ABNORMAL HIGH (ref 4.0–10.5)

## 2015-03-28 LAB — CULTURE, BLOOD (ROUTINE X 2)
CULTURE: NO GROWTH
Culture: NO GROWTH

## 2015-04-03 ENCOUNTER — Ambulatory Visit (HOSPITAL_COMMUNITY)
Admit: 2015-04-03 | Discharge: 2015-04-03 | Disposition: A | Discharge status: Home / Self Care | Payer: Medicare Other | Source: Ambulatory Visit | Attending: Internal Medicine | Admitting: Internal Medicine

## 2015-04-03 ENCOUNTER — Encounter (HOSPITAL_COMMUNITY): Payer: Self-pay | Admitting: Speech Pathology

## 2015-04-03 ENCOUNTER — Ambulatory Visit (HOSPITAL_COMMUNITY): Payer: Medicare Other | Attending: Internal Medicine | Admitting: Speech Pathology

## 2015-04-03 DIAGNOSIS — R1310 Dysphagia, unspecified: Secondary | ICD-10-CM | POA: Diagnosis present

## 2015-04-03 NOTE — Therapy (Signed)
[facility] [address] [place], [place], [address] Phone: [phone]   Fax:  [phone]  Modified Barium Swallow  Patient Details  Name: George GrippeBilly L Cook MRN: [id number] Date of Birth: 1927/08/28 No Data Recorded  Encounter Date: 04/03/2015      End of Session - 04/03/15 1817    Visit Number 1   Number of Visits 1   Authorization Type BCBS Medicare   SLP Start Time 1439   SLP Stop Time  1512   SLP Time Calculation (min) 33 min   Activity Tolerance Patient tolerated treatment well      Past Medical History  Diagnosis Date  . HTN (hypertension)   . DM (diabetes mellitus) (HCC)   . Carotid artery occlusion     a. 05/2012 U/S Bilat ICA 20-39%.  . Obesity   . Stroke [facility](HCC)     a. 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 01/2009 TEE: EF 60-65%, mild MR, no LA/LAA thrombus.  . Macular degeneration   . Hiatal hernia   . Nephrolithiasis     Past Surgical History  Procedure Laterality Date  . Finger surgery Left     pinky, after saw cut it off  . Cataract extraction w/phaco Right 10/02/2012    Procedure: CATARACT EXTRACTION PHACO AND INTRAOCULAR LENS PLACEMENT (IOC);  Surgeon: Gemma PayorKerry Hunt, MD;  Location: [facility];  Service: Ophthalmology;  Laterality: Right;  CDE 12.78  . Eye surgery    . Cataract extraction w/phaco Left 10/30/2012    Procedure: CATARACT EXTRACTION PHACO AND INTRAOCULAR LENS PLACEMENT (IOC);  Surgeon: Gemma PayorKerry Hunt, MD;  Location: [facility];  Service: Ophthalmology;  Laterality: Left;  CDE: 19.10  . Left heart catheterization with coronary angiogram Bilateral 03/08/2013    Procedure: LEFT HEART CATHETERIZATION WITH CORONARY ANGIOGRAM;  Surgeon: Laurey Moralealton S McLean, MD;  [facility];  Service: Cardiovascular;  Laterality: Bilateral;  . Tee without cardioversion N/A 12/16/2014    Procedure: TRANSESOPHAGEAL ECHOCARDIOGRAM (TEE);  Surgeon: Antoine PocheJonathan F Branch, MD;  Location: [facility];  Service: Cardiology;  Laterality: N/A;   will need 40 minutes to clean scope between procedures    There were no vitals filed for this visit.  Visit Diagnosis: Dysphagia      Subjective Assessment - 04/03/15 1802    Subjective Pt alert and cooperative, seen upright in Ha[place] chair for MBSS   Special Tests MBSS   Currently in Pain? No/denies             General - 04/03/15 1803    General Information   Date of Onset 04/02/15   HPI Pt is an 81 yo male who [facility] following a hospitalization for PNA. Pt with past medical history of COPD and showing positive signs/symptoms of aspiration with thin liquids.    Type of Study MBS-Modified Barium Swallow Study   Diet Prior to this Study Dysphagia 1 (puree);Nectar-thick liquids   Respiratory Status Room air   History of Recent Intubation No   Behavior/Cognition Alert;Cooperative;Pleasant mood;Confused   Oral Cavity Assessment Within Functional Limits   Oral Care Completed by SLP No   Oral Cavity - Dentition Edentulous   Vision Functional for self feeding   Self-Feeding Abilities Able to feed self;Needs set up   Patient Positioning Upright in chair   Baseline Vocal Quality Hoarse;Breathy   Volitional Cough Strong   Volitional Swallow Able to elicit   Anatomy Within functional limits   Pharyngeal Secretions Not observed  secondary MBS            Oral Preparation/Oral Phase - 04-11-2015 1808    Oral Preparation/Oral Phase   Oral Phase Impaired   Oral - Solids   Oral - Mechanical Soft Delayed A-P transit;Imparied mastication   Oral - Pill Delayed A-P transit   Electrical stimulation - Oral Phase   Was Electrical Stimulation Used No          Pharyngeal Phase - 04-11-15 1809    Pharyngeal Phase   Pharyngeal Phase Impaired   Pharyngeal - Nectar   Pharyngeal- Nectar Cup Delayed swallow initiation;Swallow initiation at vallecula;Reduced pharyngeal peristalsis;Reduced epiglottic inversion;Reduced tongue base retraction;Pharyngeal residue -  valleculae;Pharyngeal residue - pyriform;Lateral channel residue   Pharyngeal - Thin   Pharyngeal- Thin Cup Delayed swallow initiation;Swallow initiation at vallecula;Swallow initiation at pyriform sinus;Reduced pharyngeal peristalsis;Reduced epiglottic inversion;Reduced tongue base retraction;Pharyngeal residue - valleculae;Pharyngeal residue - pyriform;Lateral channel residue;Penetration/Aspiration before swallow;Penetration/Aspiration during swallow;Trace aspiration;Moderate aspiration   Pharyngeal Material enters airway, passes BELOW cords and not ejected out despite cough attempt by patient;Material enters airway, remains ABOVE vocal cords and not ejected out   Pharyngeal- Thin Straw Delayed swallow initiation;Swallow initiation at vallecula;Swallow initiation at pyriform sinus;Reduced pharyngeal peristalsis;Reduced epiglottic inversion;Reduced tongue base retraction;Pharyngeal residue - valleculae;Pharyngeal residue - pyriform;Lateral channel residue   Pharyngeal - Solids   Pharyngeal- Puree Swallow initiation at vallecula;Reduced pharyngeal peristalsis;Reduced epiglottic inversion;Reduced tongue base retraction;Pharyngeal residue - valleculae;Pharyngeal residue - posterior pharnyx;Pharyngeal residue - pyriform   Pharyngeal- Mechanical Soft Swallow initiation at vallecula;Reduced tongue base retraction;Pharyngeal residue - valleculae   Electrical Stimulation - Pharyngeal Phase   Was Electrical Stimulation Used No          Cricopharyngeal Phase - 04/11/15 1815    Cervical Esophageal Phase   Cervical Esophageal Phase Within functional limits           Plan - 11-Apr-2015 1817    Clinical Impression Statement Pt presents with mild/mod oral phase and moderate pharyngeal phase dysphagia negatively impacted by cognitive deficits characterized by difficulty switching tasks (alternating drinking and mastication), oral manipulation, mastication, and propulsion of solids, delay in swallow initiation  triggering after liquids fill valleculae and spill to pyriforms, decreased tongue base retraction and epiglottic deflection, with overall decreased pharyngeal pressure resulting in significant vallecular and pyriform residue post swallow across consistencies and variable aspiration of thin liquids before/during and after the swallow in trace to mod amount. Although robust cough was not elicited immediately upon aspiration, but did show subtle signs of aspiration (catch breaths) and then produced stronger cough. Residuals of thins were difficult for patient to clear from laryngeal vestibule despite cueing. Vallecular residuals (thin) also spilled into laryngeal vestibule on subsequent swallows. Pt appears safest with nectar-thick liquids and pureed to chopped diet (treating SLP to decide clinically). Despite diet modifications and attempts at compensatory strategies (repeat swallows), pt still has an appreciable risk for aspiration given cognitive deficits, decreased pharyngeal sensation (pt unaware of significant vallecular pooling pre and post swallow), decreased airway protection, and weakness.           G-Codes - April 11, 2015 1832    Functional Assessment Tool Used MBSS; clinical judgment   Functional Limitations Swallowing   Swallow Current Status (W1191) At least 20 percent but less than 40 percent impaired, limited or restricted   Swallow Goal Status (Y7829) At least 20 percent but less than 40 percent impaired, limited or restricted   Swallow Discharge Status [phone]) At least 20 percent but less than 40 percent impaired,  limited or restricted          Recommendations/Treatment - 04/03/15 1815    Swallow Evaluation Recommendations   SLP Diet Recommendations Dysphagia 2 (chopped);Dysphagia 1 (puree);Nectar   Liquid Administration via Cup   Medication Administration Whole meds with liquid  with NTL   Supervision Full supervision/cueing for compensatory strategies   Compensations Minimize  environmental distractions;Small sips/bites;Multiple dry swallows after each bite/sip;Clear throat intermittently   Postural Changes Seated upright at 90 degrees;Remain upright for at least 30 minutes after feeds/meals          Prognosis - 04/03/15 1816    Prognosis   Prognosis for Safe Diet Advancement Fair   Barriers to Reach Goals Cognitive deficits   Individuals Consulted   Consulted and Agree with Results and Recommendations Patient   Report Sent to  Referring physician;Primary SLP      Problem List Patient Active Problem List   Diagnosis Date Noted  . Sepsis (HCC) 03/23/2015  . CAP (community acquired pneumonia) 03/23/2015  . COPD (chronic obstructive pulmonary disease) (HCC) 03/23/2015  . Acute encephalopathy 03/23/2015  . CVA (cerebral infarction) 12/14/2014  . Acute CVA (cerebrovascular accident) (HCC) 12/14/2014  . COPD with acute exacerbation (HCC) 12/13/2014  . Type 2 diabetes mellitus (HCC) 03/11/2013  . CAD (coronary artery disease) 03/11/2013  . Moderate aortic stenosis 03/11/2013  . Right leg DVT (HCC) 03/11/2013  . Abnormal urine odor 03/11/2013  . Pulmonary embolism, bilateral (HCC) 03/09/2013  . Essential hypertension 03/07/2013  . Occlusion and stenosis of carotid artery without mention of cerebral infarction 06/07/2012  . FULL INCONTINENCE OF FECES 04/03/2010  . HEMATOCHEZIA 06/20/2009  . CHANGE IN BOWELS 06/20/2009   Thank you,  Havery Moros, CCC-SLP [id number]  [facility] 04/03/2015, 6:34 PM  [place] [facility] [address] [place], [place], [address] Phone: [phone]   Fax:  [phone]  Name: George Cook MRN: [id number] Date of Birth: September 14, 1927

## 2015-05-06 ENCOUNTER — Non-Acute Institutional Stay (SKILLED_NURSING_FACILITY): Payer: Medicare Other | Admitting: Internal Medicine

## 2015-05-06 DIAGNOSIS — I350 Nonrheumatic aortic (valve) stenosis: Secondary | ICD-10-CM

## 2015-05-06 DIAGNOSIS — I2699 Other pulmonary embolism without acute cor pulmonale: Secondary | ICD-10-CM

## 2015-05-06 DIAGNOSIS — J42 Unspecified chronic bronchitis: Secondary | ICD-10-CM | POA: Diagnosis not present

## 2015-05-06 DIAGNOSIS — J3489 Other specified disorders of nose and nasal sinuses: Secondary | ICD-10-CM | POA: Diagnosis not present

## 2015-05-06 DIAGNOSIS — R0989 Other specified symptoms and signs involving the circulatory and respiratory systems: Secondary | ICD-10-CM

## 2015-05-06 NOTE — Progress Notes (Signed)
Patient ID: George Cook, male   DOB: 16-Nov-1927, 81 y.o.   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 He apparently also has some degree of dementia. Family states that they noted increasing respiratory distress at home and by the time he came to the hospital he was febrile with a lactic acid of 3.5. He was admitted for sepsis community-acquired pneumonia.  He was treated aggressively with antibiotics which he completed here-since then his status has been relatively stable.  Nursing does report he has a runny nose today.  His dementia appears to be fairly significant however it does not appear behaviors have been an issue.  He also is a type II diabetic on Glucophage 500 mg twice a day as well as Amaryl 2 mg twice a day-blood sugars appear to run from the 80s to occasionally above the 200s aalthough readingsabove 200 are fairly uncommon this appears to be relatively stable.  In regards to anticoagulation he continues onEliquistt twice a day.  Currently vital signs are stable blood pressures do not appear to be consistently elevated recently 152/52-110/73-132/69-he is on Coreg 3.125 mg twice a day.--I did get 150/80 manually both arms  He is also on lisinopril 5 mg a day  Currently patient has no complaints is a poor historian secondary to dementia-nursing has not noted any increase shortness of breath or increased cough from baseline he is up in his wheelchair today appears to be comfortable   Labs.  04/23/2015.  WBC 11.0 hemoglobin 13.3 platelets 284.  03/24/2015.  Sodium 137 potassium 4.8 BUN 18 creatinine 0.83 CO2 level XXV  CBC Latest Ref Rng 03/24/2015 03/23/2015 12/13/2014  WBC 4.0 - 10.5  K/uL 17.7(H) 12.6(H) 6.3  Hemoglobin 13.0 - 17.0 g/dL 11.0(L) 12.5(L) 12.2(L)  Hematocrit 39.0 - 52.0 % 33.8(L) 38.0(L) 37.2(L)  Platelets 150 - 400 K/uL 178 197 183   BMP Latest Ref Rng 03/24/2015 03/23/2015 12/13/2014  Glucose 65 - 99 mg/dL 086(V) 72 784(O)  BUN 6 - 20 mg/dL 18 19 96(E)  Creatinine 0.61 - 1.24 mg/dL 9.52 8.41 3.24  Sodium 135 - 145 mmol/L 137 140 139  Potassium 3.5 - 5.1 mmol/L 4.8 3.7 4.3  Chloride 101 - 111 mmol/L 104 102 104  CO2 22 - 32 mmol/L Calcium 8.9 - 10.3 mg/dL 8.1(L) 9.2 9.4    Past Medical History  Diagnosis Date  . HTN (hypertension)   . DM (diabetes mellitus) (HCC)   . Carotid artery occlusion     a. 05/2012 U/S Bilat ICA 20-39%.  . Obesity   . Stroke [facility])     a. 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 01/2009 TEE: EF 60-65%, mild MR, no LA/LAA thrombus.  . Macular degeneration   . Hiatal hernia   . Nephrolithiasis     Past Surgical History  Procedure Laterality Date  . Finger surgery Left     pinky, after saw cut it off  . Cataract extraction w/phaco Right 10/02/2012    Procedure: CATARACT EXTRACTION PHACO AND INTRAOCULAR LENS PLACEMENT (IOC);  Surgeon: Gemma Payor, MD;  Location: [facility];  Service: Ophthalmology;  Laterality: Right;  CDE 12.78  . Eye surgery    . Cataract extraction w/phaco Left 10/30/2012  Procedure: CATARACT EXTRACTION PHACO AND INTRAOCULAR LENS PLACEMENT (IOC);  Surgeon: Gemma PayorKerry Hunt, MD;  Location: [facility];  Service: Ophthalmology;  Laterality: Left;  CDE: 19.10  . Left heart catheterization with coronary angiogram Bilateral 03/08/2013    Procedure: LEFT HEART CATHETERIZATION WITH CORONARY ANGIOGRAM;  Surgeon: Laurey Moralealton S McLean, MD;  [facility];  Service: Cardiovascular;  Laterality: Bilateral;  . Tee without cardioversion N/A 12/16/2014    Procedure: TRANSESOPHAGEAL ECHOCARDIOGRAM (TEE);  Surgeon: Antoine PocheJonathan F Branch, MD;  Location: [facility];  Service: Cardiology;  Laterality: N/A;  will need 40 minutes  to clean scope between procedures   Current Outpatient Prescriptions on File Prior to Visit  Medication Sig Dispense Refill  . albuterol (PROVENTIL) (2.5 MG/3ML) 0.083% nebulizer solution Take 3 mLs (2.5 mg total) by nebulization every 2 (two) hours as needed for wheezing or shortness of breath.    Marland Kitchen. apixaban (ELIQUIS) 5 MG TABS tablet Take 5 mg by mouth 2 (two) times daily.    . Ascorbic Acid (VITAMIN C) 1000 MG tablet Take 1,500 mg by mouth daily.     Marland Kitchen. atorvastatin (LIPITOR) 20 MG tablet Take 1 tablet (20 mg total) by mouth daily at 6 PM.    . azithromycin (ZITHROMAX) 500 MG tablet Take 1 tablet (500 mg total) by mouth daily. Last dose at dinner 11/3    . bumetanide (BUMEX) 2 MG tablet Take 1 mg by mouth daily as needed (for fluid).     . carvedilol (COREG) 3.125 MG tablet TAKE 1 TABLET BY MOUTH TWICE DAILY WITH MEALS. 180 tablet 3  .       . Cholecalciferol (VITAMIN D) 2000 UNITS CAPS Take 1 capsule by mouth daily after supper.    . Cyanocobalamin (B-12) 1000 MCG TBCR Take 1 tablet by mouth 2 (two) times daily.    Marland Kitchen. donepezil (ARICEPT) 10 MG tablet Take 10 mg by mouth daily after supper.     Marland Kitchen. glimepiride (AMARYL) 2 MG tablet Take 2 mg by mouth 2 (two) times daily.     Marland Kitchen. ipratropium-albuterol (DUONEB) 0.5-2.5 (3) MG/3ML SOLN Take 3 mLs by nebulization 2 (two) times daily.    Marland Kitchen. lisinopril (PRINIVIL,ZESTRIL) 5 MG tablet Take 1 tablet (5 mg total) by mouth daily. 30 tablet 3  . loperamide (IMODIUM) 2 MG capsule Take 2 mg by mouth as needed for diarrhea or loose stools.    . metFORMIN (GLUCOPHAGE) 500 MG tablet TAKE (1) TABLET BY MOUTH TWICE DAILY. 180 tablet 0  . Multiple Vitamin (MULTIVITAMIN WITH MINERALS) TABS tablet Take 1 tablet by mouth daily.    . nitroGLYCERIN (NITROSTAT) 0.4 MG SL tablet Place 1 tablet (0.4 mg total) under the tongue every 5 (five) minutes as needed for chest pain. 25 tablet 3  .       . traMADol (ULTRAM) 50 MG tablet Take 1 tablet (50 mg total) by mouth 3 (three)  times daily as needed for moderate pain. For pain 20 tablet 0   SOCIAL; lives with his wife. Very limited activity at home. Uses a walker. Sounds as though he needed a lot of basic ADL assistance  reports that he quit smoking about 23 years ago. His smoking use included Cigarettes. He started smoking about 65 years ago. He has a 120 pack-year smoking history. He has never used smokeless tobacco. He reports that he does not drink alcohol or use illicit drugs.  family history includes Coronary artery disease in his brother and father; Diabetes in his mother  and sister; Emphysema in his mother; Heart disease in his brother and father; Hypertension in his mother and sister; Stroke in his mother and sister.  Review of systems; mostly obtained from staff patient is a poor historian.  In general no reports of fever or chills.  Skin no complaints of rashes or itching.  Head ears eyes nose mouth and throat-nursing reports he does have a watery discharge from his nose at times-no visual changes of been noted  Respiratory; history of shortness of breath with exertion apparently this is not changed from baseline Cardiac no complaints of chest pain GI no abdominal pain or diarrhea GU no dysuria Musculoskeletal no joint or back pain Neurologic; history of lower extremity weakness and falls prior to admission skilled nursing Mental status; notable for memory loss  Physical examination  Temperature 97.5 pulse 70 respirations 20 blood pressure 152/52-110/73 most recently weight is 195.  In general this is a pleasant elderly male in no distress sitting comfortably in wheelchair he is confused but amiable.  Skin is warm and dry.  Eyes pupils appear reactive to light sclerae are clear.  Oropharynx clear.  Nose per nursing has watery discharge at times.  Does have prolonged expiratory phase shallow air entry some rhonchi on expiration which according in nursing is his baseline he has minimally  labored breathing this is not new either  Cardiac heart sounds are soft and regular with occasional irregular beats there is a 3/6 systolic ejection murmur  JVP is not elevated no coccyx edema Abdomen;  Is soft nontender with positive bowel sounds  Extremities;  Continued lower extremity weakness-he does have his legs wrapped bilaterally--I do not note any deformities upper lower extremities  Neurologic no lateralizing findings her speech is clear no focal deficits.  Psych--he is oriented to self pleasant follow simple verbal commands appears to have moderate dementia  Impression/plan  History of COPD and pneumonia-at this point appears relatively stable exam appears to be relatively baseline with prior exams at this point monitor he is on albuterol nebulizers 4 times a day-will add Tylenol Cold and flu when necessary every 6 hours for 72 hours secondary to runny nose and keep a close eye on him with his respiratory history-- at this point appears stable however.  #2 history of diabetes type 2 he is on Amaryl and Glucophage--at this point blood sugars appear to be stable I do not see consistent highs or lows appears to largely run in the 100s with occasional spikes above 200 but this is not common-occasionally has readings in the 80s but this is not consistent either at this point appears to be stable will update a hemoglobin A1c.  #3 history of hypertension continues on Coreg 3.125 mg twice a day and symmetric Lisinopril 5 mg a day.--Blood pressures reviewed as above I did take it manually today and got 150/80-however reviewing his blood pressures in the computer does not appear he has consistent elevations this high-at this point again will monitor if becomes more persistent will increase Lisinopril    #4 dementia-this appears to be quite significant although he appears to be doing well with supportive care here he is on Aricept.  #5-history of CVA thought to be embolic he is onEliquist 5 mg  twice a day Also note per cardiology note February 2016 he has a history of bilateral pulmonary emboli and right lower extremity DVT-this appears also to be an indication for Eliquis-one point he had been on Xarelto but this was switched to [employer]  think there was a reimbursement issue here  #6 history of elevated white count this did almost normalize in the hospital-will recheck this.  Also will update a metabolic panel for updated values.  Marland Kitchen ##7 history of calcified aortic stenosis-this apparently is considered moderate to severe-he has been relatively asymptomatic during his stay here in this point will monitor  Coronary artery disease at this point appears to be stable he is on statin Coreg and lisinopril allergic to aspirin he is on rEliquist-will update a lipid panel well  [id number]-of note greater than 35 minutes spent assessing patient-discussing his status with nursing staff-reviewing his chart-and coordinating and formulating a plan of care for numerous diagnoses-of note greater than 50% of time spent coordinating plan of care.

## 2015-05-07 ENCOUNTER — Encounter (HOSPITAL_COMMUNITY)
Admission: AD | Admit: 2015-05-07 | Discharge: 2015-05-07 | Disposition: A | Discharge status: Home / Self Care | Payer: Medicare Other | Source: Skilled Nursing Facility | Attending: Internal Medicine | Admitting: Internal Medicine

## 2015-05-07 DIAGNOSIS — I10 Essential (primary) hypertension: Secondary | ICD-10-CM | POA: Diagnosis present

## 2015-05-07 LAB — LIPID PANEL
CHOL/HDL RATIO: 2.2 ratio
CHOLESTEROL: 72 mg/dL (ref 0–200)
HDL: 33 mg/dL — AB (ref >40)
LDL Cholesterol: 9 mg/dL (ref 0–99)
Triglycerides: 150 mg/dL — ABNORMAL HIGH (ref <150)
VLDL: 30 mg/dL (ref 0–40)

## 2015-05-07 LAB — CBC WITH DIFFERENTIAL/PLATELET
BASOS ABS: 0 10*3/uL (ref 0.0–0.1)
Basophils Relative: 0 %
EOS ABS: 0.3 10*3/uL (ref 0.0–0.7)
EOS PCT: 4 %
HCT: 41.1 % (ref 39.0–52.0)
Hemoglobin: 13.1 g/dL (ref 13.0–17.0)
Lymphocytes Relative: 21 %
Lymphs Abs: 1.6 10*3/uL (ref 0.7–4.0)
MCH: 28.1 pg (ref 26.0–34.0)
MCHC: 31.9 g/dL (ref 30.0–36.0)
MCV: 88 fL (ref 78.0–100.0)
Monocytes Absolute: 0.4 10*3/uL (ref 0.1–1.0)
Monocytes Relative: 5 %
Neutro Abs: 5.2 10*3/uL (ref 1.7–7.7)
Neutrophils Relative %: 70 %
PLATELETS: 216 10*3/uL (ref 150–400)
RBC: 4.67 MIL/uL (ref 4.22–5.81)
RDW: 14.1 % (ref 11.5–15.5)
WBC: 7.4 10*3/uL (ref 4.0–10.5)

## 2015-05-07 LAB — COMPREHENSIVE METABOLIC PANEL
ALT: 16 U/L — AB (ref 17–63)
AST: 25 U/L (ref 15–41)
Albumin: 3.5 g/dL (ref 3.5–5.0)
Alkaline Phosphatase: 46 U/L (ref 38–126)
Anion gap: 7 (ref 5–15)
BILIRUBIN TOTAL: 0.8 mg/dL (ref 0.3–1.2)
BUN: 17 mg/dL (ref 6–20)
CALCIUM: 8.9 mg/dL (ref 8.9–10.3)
CO2: 29 mmol/L (ref 22–32)
CREATININE: 0.84 mg/dL (ref 0.61–1.24)
Chloride: 103 mmol/L (ref 101–111)
GFR calc Af Amer: >60 mL/min (ref >60)
GFR calc non Af Amer: >60 mL/min (ref >60)
Glucose, Bld: 161 mg/dL — ABNORMAL HIGH (ref 65–99)
POTASSIUM: 5.1 mmol/L (ref 3.5–5.1)
Sodium: 139 mmol/L (ref 135–145)
TOTAL PROTEIN: 6.4 g/dL — AB (ref 6.5–8.1)

## 2015-05-08 LAB — HEMOGLOBIN A1C
HEMOGLOBIN A1C: 6.7 % — AB (ref 4.8–5.6)
Mean Plasma Glucose: 146 mg/dL

## 2015-05-09 ENCOUNTER — Other Ambulatory Visit: Payer: Self-pay | Admitting: Internal Medicine

## 2015-05-09 ENCOUNTER — Non-Acute Institutional Stay (SKILLED_NURSING_FACILITY): Payer: Medicare Other | Admitting: Internal Medicine

## 2015-05-09 ENCOUNTER — Ambulatory Visit (HOSPITAL_COMMUNITY)
Admission: RE | Admit: 2015-05-09 | Discharge: 2015-05-09 | Disposition: A | Discharge status: Home / Self Care | Payer: Medicare Other | Source: Ambulatory Visit | Attending: Internal Medicine | Admitting: Internal Medicine

## 2015-05-09 DIAGNOSIS — I5030 Unspecified diastolic (congestive) heart failure: Secondary | ICD-10-CM

## 2015-05-09 DIAGNOSIS — R609 Edema, unspecified: Secondary | ICD-10-CM

## 2015-05-09 DIAGNOSIS — J42 Unspecified chronic bronchitis: Secondary | ICD-10-CM | POA: Diagnosis not present

## 2015-05-09 DIAGNOSIS — R0602 Shortness of breath: Secondary | ICD-10-CM | POA: Diagnosis present

## 2015-05-09 DIAGNOSIS — R0989 Other specified symptoms and signs involving the circulatory and respiratory systems: Secondary | ICD-10-CM

## 2015-05-09 NOTE — Progress Notes (Signed)
Patient ID: George Cook, male   DOB: November 05, 1927, 81 y.o.   MRN: [id number]       This is an acute visit Facility; [facility]  Chief complaint- Acute visit follow-up edema  HPI;  this is an 81 year old man with the a history of COPD [receiving nebulizers at home]. He apparently also has some degree of dementia. Family states that they noted increasing respiratory distress at home and by the time he came to the hospital he was febrile with a lactic acid of 3.5. He was admitted for sepsis community-acquired pneumonia.  He was treated aggressively with antibiotics which he completed here-since then his status has been relatively stable.     In regards to anticoagulation he continues onEliquistt twice a day with a history of coronary artery disease.   He does have a history of chronic lower extremity edema has an order for Bumex 1 mg when necessary-nursing has been giving him the Bumex apparently last couple days-his nurse today reports the edema appears to be improved. Per chart review it appears he does have a history of grade 1 diastolic dysfunction per echo in July 2016 systolic function was normal  His vital signs continued to be stable O2 saturations are in the 90s-he always has some mildly appearing labored breathing but this is not new he does not report any increase shortness of breath His weights appear to be stable although I do not see await the last couple days-will order weights daily to keep an eye on this    Labs.  05/07/2015.  Sodium 139 potassium 5.1 BUN 17 creatinine 0.84.  ALT 16 otherwise liver function tests within normal limits.  WBC 7.4 hemoglobin 13.1 platelets 216  04/23/2015.  WBC 11.0 hemoglobin 13.3 platelets 284.  03/24/2015.  Sodium 137 potassium 4.8 BUN 18 creatinine 0.83 CO2 level XXV  CBC Latest Ref Rng 03/24/2015 03/23/2015 12/13/2014  WBC 4.0 - 10.5 K/uL 17.7(H) 12.6(H) 6.3  Hemoglobin 13.0 - 17.0 g/dL 11.0(L) 12.5(L) 12.2(L)  Hematocrit  39.0 - 52.0 % 33.8(L) 38.0(L) 37.2(L)  Platelets 150 - 400 K/uL 178 197 183   BMP Latest Ref Rng 03/24/2015 03/23/2015 12/13/2014  Glucose 65 - 99 mg/dL 409(W) 72 119(J)  BUN 6 - 20 mg/dL 18 19 47(W)  Creatinine 0.61 - 1.24 mg/dL 2.95 6.21 3.08  Sodium 135 - 145 mmol/L 137 140 139  Potassium 3.5 - 5.1 mmol/L 4.8 3.7 4.3  Chloride 101 - 111 mmol/L 104 102 104  CO2 22 - 32 mmol/L 25 26 28   Calcium 8.9 - 10.3 mg/dL 8.1(L) 9.2 9.4    Past Medical History  Diagnosis Date  . HTN (hypertension)   . DM (diabetes mellitus) (HCC)   . Carotid artery occlusion     a. 05/2012 U/S Bilat ICA 20-39%.  . Obesity   . Stroke [facility])     a. 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 01/2009 TEE: EF 60-65%, mild MR, no LA/LAA thrombus.  . Macular degeneration   . Hiatal hernia   . Nephrolithiasis     Past Surgical History  Procedure Laterality Date  . Finger surgery Left     pinky, after saw cut it off  . Cataract extraction w/phaco Right 10/02/2012    Procedure: CATARACT EXTRACTION PHACO AND INTRAOCULAR LENS PLACEMENT (IOC);  Surgeon: Gemma Payor, MD;  Location: [facility];  Service: Ophthalmology;  Laterality: Right;  CDE 12.78  . Eye surgery    . Cataract extraction w/phaco Left 10/30/2012    Procedure:  CATARACT EXTRACTION PHACO AND INTRAOCULAR LENS PLACEMENT (IOC);  Surgeon: Gemma Payor, MD;  Location: [facility];  Service: Ophthalmology;  Laterality: Left;  CDE: 19.10  . Left heart catheterization with coronary angiogram Bilateral 03/08/2013    Procedure: LEFT HEART CATHETERIZATION WITH CORONARY ANGIOGRAM;  Surgeon: Laurey Morale, MD;  Location: [facility] CATH LAB;  Service: Cardiovascular;  Laterality: Bilateral;  . Tee without cardioversion N/A 12/16/2014    Procedure: TRANSESOPHAGEAL ECHOCARDIOGRAM (TEE);  Surgeon: Antoine Poche, MD;  Location: [facility];  Service: Cardiology;  Laterality: N/A;  will need 40 minutes to clean scope between procedures   Current Outpatient Prescriptions on File Prior to  Visit  Medication Sig Dispense Refill  . albuterol (PROVENTIL) (2.5 MG/3ML) 0.083% nebulizer solution Take 3 mLs (2.5 mg total) by nebulization every 2 (two) hours as needed for wheezing or shortness of breath.    Marland Kitchen apixaban (ELIQUIS) 5 MG TABS tablet Take 5 mg by mouth 2 (two) times daily.    . Ascorbic Acid (VITAMIN C) 1000 MG tablet Take 1,500 mg by mouth daily.     Marland Kitchen atorvastatin (LIPITOR) 20 MG tablet Take 1 tablet (20 mg total) by mouth daily at 6 PM.    . azithromycin (ZITHROMAX) 500 MG tablet Take 1 tablet (500 mg total) by mouth daily. Last dose at dinner 11/3    . bumetanide (BUMEX) 2 MG tablet Take 1 mg by mouth daily as needed (for fluid).     . carvedilol (COREG) 3.125 MG tablet TAKE 1 TABLET BY MOUTH TWICE DAILY WITH MEALS. 180 tablet 3  .       . Cholecalciferol (VITAMIN D) 2000 UNITS CAPS Take 1 capsule by mouth daily after supper.    . Cyanocobalamin (B-12) 1000 MCG TBCR Take 1 tablet by mouth 2 (two) times daily.    Marland Kitchen donepezil (ARICEPT) 10 MG tablet Take 10 mg by mouth daily after supper.     Marland Kitchen glimepiride (AMARYL) 2 MG tablet Take 2 mg by mouth 2 (two) times daily.     Marland Kitchen ipratropium-albuterol (DUONEB) 0.5-2.5 (3) MG/3ML SOLN Take 3 mLs by nebulization 2 (two) times daily.    Marland Kitchen lisinopril (PRINIVIL,ZESTRIL) 5 MG tablet Take 1 tablet (5 mg total) by mouth daily. 30 tablet 3  . loperamide (IMODIUM) 2 MG capsule Take 2 mg by mouth as needed for diarrhea or loose stools.    . metFORMIN (GLUCOPHAGE) 500 MG tablet TAKE (1) TABLET BY MOUTH TWICE DAILY. 180 tablet 0  . Multiple Vitamin (MULTIVITAMIN WITH MINERALS) TABS tablet Take 1 tablet by mouth daily.    . nitroGLYCERIN (NITROSTAT) 0.4 MG SL tablet Place 1 tablet (0.4 mg total) under the tongue every 5 (five) minutes as needed for chest pain. 25 tablet 3  .       . traMADol (ULTRAM) 50 MG tablet Take 1 tablet (50 mg total) by mouth 3 (three) times daily as needed for moderate pain. For pain 20 tablet 0   SOCIAL; lives with his  wife. Very limited activity at home. Uses a walker. Sounds as though he needed a lot of basic ADL assistance  reports that he quit smoking about 23 years ago. His smoking use included Cigarettes. He started smoking about 65 years ago. He has a 120 pack-year smoking history. He has never used smokeless tobacco. He reports that he does not drink alcohol or use illicit drugs.  family history includes Coronary artery disease in his brother and father; Diabetes in his mother and  sister; Emphysema in his mother; Heart disease in his brother and father; Hypertension in his mother and sister; Stroke in his mother and sister.  Review of systems; mostly obtained from staff patient is a poor historian.  In general no reports of fever or chills.  Skin no complaints of rashes or itching.  Head ears eyes nose mouth and throat-patient previously and a runny nose but apparently this has improved   Respiratory; history of shortness of breath with exertion apparently this is not changed from baseline Cardiac no complaints of chest pain--has chronic lower extremity edema  GI no abdominal pain or diarrhea GU no dysuria Musculoskeletal no joint or back pain Neurologic; history of lower extremity weakness and falls prior to admission skilled nursing Mental status; notable for memory loss  Physical examination   He is afebrile 97.7 pulse 88 respirations 24 blood pressure 141/76-appears range from 141/76-113/64 recently   In general this is a pleasant elderly male in no distress lying comfortably in bed.  Skin is warm and dry.  Eyes pupils appear reactive to light sclerae are clear.  Oropharynx clear. .  Does have prolonged expiratory phase shallow air entry some rhonchi on expiration which according in nursing is his baseline he has minimally labored breathing this is not new either  Cardiac heart sounds are soft and regular with occasional irregular beats there is a 3/6 systolic ejection murmur  JVP  is not elevated no coccyx edema  He appears to have chronic lower extremity edema somewhat more on the right versus the left  3+ on the right 2+ on the left pedal pulses palpable but somewhat reduced I suspect secondary to the edema   Abdomen;  Is soft nontender with positive bowel sounds  Extremities;  Continued lower extremity weakness--I do not note any deformities upper lower extremities  Neurologic no lateralizing findings her speech is clear no focal deficits.  Psych--he is oriented to self pleasant follow simple verbal commands appears to have moderate dementia  Impression/plan  Increased lower extremity edema-his weight actually appears to be stable-- according to his nurse was been giving him Bumex apparently last couple days-- edema has gotten better with administration of the Bumex-appears to be baseline exam today-- I do note he has a somewhat more a edematous right leg versus left however he does have a previous history DVT here-will update a venous Doppler-he is on Eliquis  for anticoagulation  We'll make Bumex routine says he appears to be responding well to this but will have to keep an eye on his renal function and electrolytes.  Also will update a BMP and BNP tomorrow-he does have a listed history of grade 1 diastolic dysfunction.  Also will obtain a chest x-ray to rule out any pulmonary edema.    History of COPD and pneumonia-at this point appears relatively stable exam appears to be relatively baseline with prior exams at this point monitor he is on albuterol nebulizers 4 times a day- .     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  .  history of  calcified aortic stenosis-this apparently is considered moderate to severe-he has been relatively asymptomatic during his stay here  in this point will monitor  [phone]

## 2015-05-10 ENCOUNTER — Encounter (HOSPITAL_COMMUNITY)
Admission: RE | Admit: 2015-05-10 | Discharge: 2015-05-10 | Disposition: A | Discharge status: Home / Self Care | Payer: Medicare Other | Source: Skilled Nursing Facility | Attending: Internal Medicine | Admitting: Internal Medicine

## 2015-05-10 DIAGNOSIS — I10 Essential (primary) hypertension: Secondary | ICD-10-CM | POA: Diagnosis not present

## 2015-05-10 LAB — BASIC METABOLIC PANEL
ANION GAP: 9 (ref 5–15)
BUN: 21 mg/dL — ABNORMAL HIGH (ref 6–20)
CALCIUM: 8.6 mg/dL — AB (ref 8.9–10.3)
CO2: 29 mmol/L (ref 22–32)
Chloride: 102 mmol/L (ref 101–111)
Creatinine, Ser: 0.85 mg/dL (ref 0.61–1.24)
GLUCOSE: 97 mg/dL (ref 65–99)
POTASSIUM: 3.8 mmol/L (ref 3.5–5.1)
Sodium: 140 mmol/L (ref 135–145)

## 2015-05-10 LAB — BRAIN NATRIURETIC PEPTIDE: B Natriuretic Peptide: 165 pg/mL — ABNORMAL HIGH (ref 0.0–100.0)

## 2015-05-12 ENCOUNTER — Other Ambulatory Visit: Payer: Self-pay | Admitting: Internal Medicine

## 2015-05-12 DIAGNOSIS — I82401 Acute embolism and thrombosis of unspecified deep veins of right lower extremity: Secondary | ICD-10-CM

## 2015-05-13 ENCOUNTER — Ambulatory Visit (HOSPITAL_COMMUNITY)
Admission: RE | Admit: 2015-05-13 | Discharge: 2015-05-13 | Disposition: A | Discharge status: Home / Self Care | Payer: Medicare Other | Source: Ambulatory Visit | Attending: Internal Medicine | Admitting: Internal Medicine

## 2015-05-13 DIAGNOSIS — I82811 Embolism and thrombosis of superficial veins of right lower extremities: Secondary | ICD-10-CM | POA: Diagnosis not present

## 2015-05-13 DIAGNOSIS — I82401 Acute embolism and thrombosis of unspecified deep veins of right lower extremity: Secondary | ICD-10-CM | POA: Diagnosis present

## 2015-05-14 ENCOUNTER — Encounter (HOSPITAL_COMMUNITY)
Admission: RE | Admit: 2015-05-14 | Discharge: 2015-05-14 | Disposition: A | Discharge status: Home / Self Care | Payer: Medicare Other | Source: Skilled Nursing Facility | Attending: Internal Medicine | Admitting: Internal Medicine

## 2015-05-14 DIAGNOSIS — I10 Essential (primary) hypertension: Secondary | ICD-10-CM | POA: Diagnosis not present

## 2015-05-14 LAB — BASIC METABOLIC PANEL
Anion gap: 7 (ref 5–15)
BUN: 22 mg/dL — AB (ref 6–20)
CHLORIDE: 103 mmol/L (ref 101–111)
CO2: 32 mmol/L (ref 22–32)
CREATININE: 0.85 mg/dL (ref 0.61–1.24)
Calcium: 8.5 mg/dL — ABNORMAL LOW (ref 8.9–10.3)
GFR calc Af Amer: >60 mL/min (ref >60)
GFR calc non Af Amer: >60 mL/min (ref >60)
GLUCOSE: 111 mg/dL — AB (ref 65–99)
POTASSIUM: 4.1 mmol/L (ref 3.5–5.1)
Sodium: 142 mmol/L (ref 135–145)

## 2015-05-19 ENCOUNTER — Encounter (HOSPITAL_COMMUNITY)
Admission: RE | Admit: 2015-05-19 | Discharge: 2015-05-19 | Disposition: A | Discharge status: Home / Self Care | Payer: Medicare Other | Source: Skilled Nursing Facility | Attending: Internal Medicine | Admitting: Internal Medicine

## 2015-05-19 DIAGNOSIS — I10 Essential (primary) hypertension: Secondary | ICD-10-CM | POA: Diagnosis not present

## 2015-05-19 LAB — BASIC METABOLIC PANEL
Anion gap: 6 (ref 5–15)
BUN: 16 mg/dL (ref 6–20)
CALCIUM: 8.9 mg/dL (ref 8.9–10.3)
CO2: 32 mmol/L (ref 22–32)
CREATININE: 0.94 mg/dL (ref 0.61–1.24)
Chloride: 100 mmol/L — ABNORMAL LOW (ref 101–111)
Glucose, Bld: 108 mg/dL — ABNORMAL HIGH (ref 65–99)
Potassium: 4.6 mmol/L (ref 3.5–5.1)
SODIUM: 138 mmol/L (ref 135–145)

## 2015-06-17 ENCOUNTER — Telehealth: Payer: Self-pay

## 2015-06-17 NOTE — Telephone Encounter (Signed)
Received a call from Camelia with insurance company [phone] in reference to pt. Pt is currently a resident at [facility], so I directed Camelia to contact [facility] to obtain needed information.

## 2015-06-24 ENCOUNTER — Other Ambulatory Visit (HOSPITAL_COMMUNITY): Payer: Medicare Other

## 2015-06-24 ENCOUNTER — Ambulatory Visit: Payer: Medicare Other | Admitting: Family

## 2015-06-25 DIAGNOSIS — J441 Chronic obstructive pulmonary disease with (acute) exacerbation: Secondary | ICD-10-CM | POA: Diagnosis not present

## 2015-06-25 DIAGNOSIS — R262 Difficulty in walking, not elsewhere classified: Secondary | ICD-10-CM | POA: Diagnosis not present

## 2015-06-25 DIAGNOSIS — I639 Cerebral infarction, unspecified: Secondary | ICD-10-CM | POA: Diagnosis not present

## 2015-06-25 DIAGNOSIS — R1312 Dysphagia, oropharyngeal phase: Secondary | ICD-10-CM | POA: Diagnosis not present

## 2015-06-25 DIAGNOSIS — M6281 Muscle weakness (generalized): Secondary | ICD-10-CM | POA: Diagnosis not present

## 2015-06-25 DIAGNOSIS — F0390 Unspecified dementia without behavioral disturbance: Secondary | ICD-10-CM | POA: Diagnosis not present

## 2015-06-25 DIAGNOSIS — R278 Other lack of coordination: Secondary | ICD-10-CM | POA: Diagnosis not present

## 2015-07-23 DIAGNOSIS — F0390 Unspecified dementia without behavioral disturbance: Secondary | ICD-10-CM | POA: Diagnosis not present

## 2015-07-23 DIAGNOSIS — R262 Difficulty in walking, not elsewhere classified: Secondary | ICD-10-CM | POA: Diagnosis not present

## 2015-07-23 DIAGNOSIS — J441 Chronic obstructive pulmonary disease with (acute) exacerbation: Secondary | ICD-10-CM | POA: Diagnosis not present

## 2015-07-23 DIAGNOSIS — M6281 Muscle weakness (generalized): Secondary | ICD-10-CM | POA: Diagnosis not present

## 2015-07-23 DIAGNOSIS — R278 Other lack of coordination: Secondary | ICD-10-CM | POA: Diagnosis not present

## 2015-07-23 DIAGNOSIS — I639 Cerebral infarction, unspecified: Secondary | ICD-10-CM | POA: Diagnosis not present

## 2015-08-05 ENCOUNTER — Non-Acute Institutional Stay (SKILLED_NURSING_FACILITY): Payer: PPO | Admitting: Internal Medicine

## 2015-08-05 DIAGNOSIS — I10 Essential (primary) hypertension: Secondary | ICD-10-CM

## 2015-08-05 DIAGNOSIS — E119 Type 2 diabetes mellitus without complications: Secondary | ICD-10-CM | POA: Diagnosis not present

## 2015-08-05 DIAGNOSIS — R609 Edema, unspecified: Secondary | ICD-10-CM | POA: Diagnosis not present

## 2015-08-05 DIAGNOSIS — I2699 Other pulmonary embolism without acute cor pulmonale: Secondary | ICD-10-CM | POA: Diagnosis not present

## 2015-08-05 DIAGNOSIS — J441 Chronic obstructive pulmonary disease with (acute) exacerbation: Secondary | ICD-10-CM

## 2015-08-05 NOTE — Progress Notes (Signed)
Patient ID: George Cook, male   DOB: 1927/07/17, 82 y.o.   MRN: [id number]        This is a routine visitt Facility; [facility]  Chief complaint- Medical management of chronic medical issues including diabetes type 2-CVA-chronic lower extremity edema--COPD--dementia HPI;  this is an 82 year old man with the a history of COPD [receiving nebulizers at home]. He apparently also has some degree of dementia. Family states that they noted increasing respiratory distress at home and by the time he came to the hospital he was febrile with a lactic acid of 3.5. He was admitted for sepsis community-acquired pneumonia.  He was treated aggressively with antibiotics which he completed here-since then his status has been relatively stable.     In regards to anticoagulation he continues onEliquistt twice a day with a history of coronary artery disease.   He does have a history of chronic lower extremity edema has an order for Bumex 1 mg daily  Per chart review it appears he does have a history of grade 1 diastolic dysfunction per echo in July 2016 systolic function was normal  He is a type II diabetic he is on Amaryl 2 mg twice a day as well as Glucophage 500 mg twice a day-appear she has sugars are somewhat low at times in the morning recently ranging from 71 to the low 100s-at 4:30 appears to be somewhat higher ranging from the 80s to mid 100s.    His vital signs continued to be stable O2 saturations are in the 90s Weight appears to be relatively stable largely in the higher 180s it appears recently  Patient does have a history of anxiety is on 0.25 mg of Ativan twice a day when necessary nursing staff and family feels  He  would benefit from a routine dose in the evening secondary to apparently increased agitation and anxiety at those times   Labs  05/18/2016.  Sodium 138 potassium 4.6 BUN 16 creatinine 0.94 CO2 32.  .  05/07/2015.  Sodium 139 potassium 5.1 BUN 17 creatinine  0.84.  ALT 16 otherwise liver function tests within normal limits.  WBC 7.4 hemoglobin 13.1 platelets 216  04/23/2015.  WBC 11.0 hemoglobin 13.3 platelets 284.  03/24/2015.  Sodium 137 potassium 4.8 BUN 18 creatinine 0.83 CO2 level XXV  CBC Latest Ref Rng 03/24/2015 03/23/2015 12/13/2014  WBC 4.0 - 10.5 K/uL 17.7(H) 12.6(H) 6.3  Hemoglobin 13.0 - 17.0 g/dL 11.0(L) 12.5(L) 12.2(L)  Hematocrit 39.0 - 52.0 % 33.8(L) 38.0(L) 37.2(L)  Platelets 150 - 400 K/uL 178 197 183   BMP Latest Ref Rng 03/24/2015 03/23/2015 12/13/2014  Glucose 65 - 99 mg/dL 409(W) 72 119(J)  BUN 6 - 20 mg/dL 18 19 47(W)  Creatinine 0.61 - 1.24 mg/dL 2.95 6.21 3.08  Sodium 135 - 145 mmol/L 137 140 139  Potassium 3.5 - 5.1 mmol/L 4.8 3.7 4.3  Chloride 101 - 111 mmol/L 104 102 104  CO2 22 - 32 mmol/L 25 26 28   Calcium 8.9 - 10.3 mg/dL 8.1(L) 9.2 9.4    Past Medical History  Diagnosis Date  . HTN (hypertension)   . DM (diabetes mellitus) (HCC)   . Carotid artery occlusion     a. 05/2012 U/S Bilat ICA 20-39%.  . Obesity   . Stroke [facility])     a. 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 01/2009 TEE: EF 60-65%, mild MR, no LA/LAA thrombus.  . Macular degeneration   . Hiatal hernia   . Nephrolithiasis  Past Surgical History  Procedure Laterality Date  . Finger surgery Left     pinky, after saw cut it off  . Cataract extraction w/phaco Right 10/02/2012    Procedure: CATARACT EXTRACTION PHACO AND INTRAOCULAR LENS PLACEMENT (IOC);  Surgeon: Gemma Payor, MD;  Location: [facility];  Service: Ophthalmology;  Laterality: Right;  CDE 12.78  . Eye surgery    . Cataract extraction w/phaco Left 10/30/2012    Procedure: CATARACT EXTRACTION PHACO AND INTRAOCULAR LENS PLACEMENT (IOC);  Surgeon: Gemma Payor, MD;  Location: [facility];  Service: Ophthalmology;  Laterality: Left;  CDE: 19.10  . Left heart catheterization with coronary angiogram Bilateral 03/08/2013    Procedure: LEFT HEART CATHETERIZATION WITH CORONARY ANGIOGRAM;   Surgeon: Laurey Morale, MD;  Location: [facility] CATH LAB;  Service: Cardiovascular;  Laterality: Bilateral;  . Tee without cardioversion N/A 12/16/2014    Procedure: TRANSESOPHAGEAL ECHOCARDIOGRAM (TEE);  Surgeon: Antoine Poche, MD;  Location: [facility] ENDO SUITE;  Service: Cardiology;  Laterality: N/A;  will need 40 minutes to clean scope between procedures   Current Outpatient Prescriptions on File Prior to Visit  Medication Sig Dispense Refill  . albuterol (PROVENTIL) (2.5 MG/3ML) 0.083% nebulizer solution Take 3 mLs (2.5 mg total) by nebulization every 2 (two) hours as needed for wheezing or shortness of breath.    Marland Kitchen apixaban (ELIQUIS) 5 MG TABS tablet Take 5 mg by mouth 2 (two) times daily.    . Ascorbic Acid (VITAMIN C) 1000 MG tablet Take 1,500 mg by mouth daily.     Marland Kitchen atorvastatin (LIPITOR) 20 MG tablet Take 1 tablet (20 mg total) by mouth daily at 6 PM.    .       . bumetanide (BUMEX) 2 MG tablet Take 1 mg by mouth daily .     . carvedilol (COREG) 3.125 MG tablet TAKE 1 TABLET BY MOUTH TWICE DAILY WITH MEALS. 180 tablet 3  .       . Cholecalciferol (VITAMIN D) 2000 UNITS CAPS Take 1 capsule by mouth daily after supper.    . Cyanocobalamin (B-12) 1000 MCG TBCR Take 1 tablet by mouth 2 (two) times daily.    Marland Kitchen donepezil (ARICEPT) 10 MG tablet Take 10 mg by mouth daily after supper.     Marland Kitchen glimepiride (AMARYL) 2 MG tablet Take 2 mg by mouth 2 (two) times daily.     Marland Kitchen ipratropium-albuterol (DUONEB) 0.5-2.5 (3) MG/3ML SOLN Take 3 mLs by nebulization 2 (two) times daily.    Marland Kitchen lisinopril (PRINIVIL,ZESTRIL) 5 MG tablet Take 1 tablet (5 mg total) by mouth daily. 30 tablet 3  . loperamide (IMODIUM) 2 MG capsule Take 2 mg by mouth as needed for diarrhea or loose stools.    . metFORMIN (GLUCOPHAGE) 500 MG tablet TAKE (1) TABLET BY MOUTH TWICE DAILY. 180 tablet 0  . Multiple Vitamin (MULTIVITAMIN WITH MINERALS) TABS tablet Take 1 tablet by mouth daily.    . nitroGLYCERIN (NITROSTAT) 0.4 MG SL tablet  Place 1 tablet (0.4 mg total) under the tongue every 5 (five) minutes as needed for chest pain. 25 tablet 3  .       . traMADol (ULTRAM) 50 MG tablet Take 1 tablet (50 mg total) by mouth 3 (three) times daily as needed for moderate pain. For pain 20 tablet 0   SOCIAL; lives with his wife. Very limited activity at home. Uses a walker. Sounds as though he needed a lot of basic ADL assistance  reports that he quit smoking about  23 years ago. His smoking use included Cigarettes. He started smoking about 65 years ago. He has a 120 pack-year smoking history. He has never used smokeless tobacco. He reports that he does not drink alcohol or use illicit drugs.  family history includes Coronary artery disease in his brother and father; Diabetes in his mother and sister; Emphysema in his mother; Heart disease in his brother and father; Hypertension in his mother and sister; Stroke in his mother and sister.  Review of systems; mostly obtained from staff patient is a poor historian.  In general no reports of fever or chills.  Skin no complaints of rashes or itching.  Head ears eyes nose mouth and throat-no complaints of visual changes or nasal discharge today   Respiratory; does not complain of shortness of breath today Cardiac no complaints of chest pain--has chronic lower extremity edema  GI no abdominal pain or diarrhea GU no dysuria Musculoskeletal no joint or back pain Neurologic; history of lower extremity weakness and falls prior to admission skilled nursing Mental status; notable for memory loss-has anxiety more so at night  Physical examination   Temperature 96.9 pulse 78 respirations 20 blood pressure 125/61 weight is 190.2 baseline appears to be in the higher 180s  In general this is a pleasant elderly male in no distress   Skin is warm and dry.  Eyes pupils appear reactive to light sclerae are clear.  Oropharynx clear. .  Does have prolonged expiratory phase shallow air entry   Do not really see labored breathing today  Cardiac heart sounds are soft and regular with occasional irregular beats there is a 3/6 systolic ejection murmur    He appears to have chronic lower extremity edema somewhat more on the right versus the left  upper leave this has improved somewhat from previous exam    Abdomen;  Is soft nontender with positive bowel sounds  Extremities;  Continued lower extremity weakness--I do not note any deformities upper lower extremities--ambulates in a wheelchair  Neurologic no lateralizing findings -- speech is clear no focal deficits.  Psych--he is oriented to self pleasant follow simple verbal commands appears to have moderate dementia  Impression/plan  History of grade 1 diastolic CHF-this appears to be stable weights appear to be relatively stable although this will have to be watched will update a metabolic panel he continues on Bumex  Also will update a BMP      History of COPD and pneumonia-at this point appears relatively stable exam appears to be relatively baseline with prior exams at this point monitor he is on albuterol nebulizers 4 times a day- .     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  .  history of calcified aortic stenosis-this apparently is considered moderate to severe-he has been relatively asymptomatic during his stay here in this point will monitor  History diabetes type 2 somewhat concerned about his lower blood sugars in the 70s  At times in the morning will decrease his Amaryl to 1 mg twice a day and increase CBGs to before meals and at bedtime and monitor.--We will check a hemoglobin A1c  I history  of hyperlipidemia he is on  Lipitor will update liver function tests as well as a lipid panel.  History of anxiety-as noted above will make Ativan 0.25 mg routine in the evening secondary to anxiety-will continue a when necessary dose he can have earlier in the day as well.  Dementia.  This appears to be moderate will continue Aricept and continue supportive care he appears to be doing well with this.  History hypertension he is on Coreg as well asLisinopril-this appears to be stable recently 125/61-105/44 I do see 92/52 but this appears to be quite infrequent at this point will monitor  [id number]-of note greater than 40 minutes spent assessing patient-discussing his status with nursing staff-reviewing his chart-and coordinating formulating plan of care for numerous diagnoses-of note greater than 50% of time spent coordinating plan of care

## 2015-08-06 ENCOUNTER — Encounter (HOSPITAL_COMMUNITY)
Admission: RE | Admit: 2015-08-06 | Discharge: 2015-08-06 | Disposition: A | Discharge status: Home / Self Care | Payer: Medicare Other | Source: Skilled Nursing Facility | Attending: Internal Medicine | Admitting: Internal Medicine

## 2015-08-06 DIAGNOSIS — R262 Difficulty in walking, not elsewhere classified: Secondary | ICD-10-CM | POA: Diagnosis present

## 2015-08-06 DIAGNOSIS — R1312 Dysphagia, oropharyngeal phase: Secondary | ICD-10-CM | POA: Diagnosis present

## 2015-08-06 DIAGNOSIS — F0390 Unspecified dementia without behavioral disturbance: Secondary | ICD-10-CM | POA: Diagnosis not present

## 2015-08-06 DIAGNOSIS — R278 Other lack of coordination: Secondary | ICD-10-CM | POA: Diagnosis present

## 2015-08-06 DIAGNOSIS — M6281 Muscle weakness (generalized): Secondary | ICD-10-CM | POA: Insufficient documentation

## 2015-08-06 LAB — COMPREHENSIVE METABOLIC PANEL
ALT: 13 U/L — AB (ref 17–63)
AST: 17 U/L (ref 15–41)
Albumin: 2.9 g/dL — ABNORMAL LOW (ref 3.5–5.0)
Alkaline Phosphatase: 34 U/L — ABNORMAL LOW (ref 38–126)
Anion gap: 7 (ref 5–15)
BUN: 22 mg/dL — ABNORMAL HIGH (ref 6–20)
CHLORIDE: 102 mmol/L (ref 101–111)
CO2: 29 mmol/L (ref 22–32)
Calcium: 8.5 mg/dL — ABNORMAL LOW (ref 8.9–10.3)
Creatinine, Ser: 0.8 mg/dL (ref 0.61–1.24)
Glucose, Bld: 80 mg/dL (ref 65–99)
POTASSIUM: 4.4 mmol/L (ref 3.5–5.1)
Sodium: 138 mmol/L (ref 135–145)
Total Bilirubin: 0.5 mg/dL (ref 0.3–1.2)
Total Protein: 5.5 g/dL — ABNORMAL LOW (ref 6.5–8.1)

## 2015-08-06 LAB — CBC WITH DIFFERENTIAL/PLATELET
BASOS ABS: 0 10*3/uL (ref 0.0–0.1)
Basophils Relative: 0 %
EOS PCT: 3 %
Eosinophils Absolute: 0.3 10*3/uL (ref 0.0–0.7)
HEMATOCRIT: 37.2 % — AB (ref 39.0–52.0)
Hemoglobin: 12 g/dL — ABNORMAL LOW (ref 13.0–17.0)
LYMPHS ABS: 1.4 10*3/uL (ref 0.7–4.0)
LYMPHS PCT: 18 %
MCH: 28 pg (ref 26.0–34.0)
MCHC: 32.3 g/dL (ref 30.0–36.0)
MCV: 86.9 fL (ref 78.0–100.0)
MONO ABS: 0.7 10*3/uL (ref 0.1–1.0)
Monocytes Relative: 9 %
NEUTROS ABS: 5.4 10*3/uL (ref 1.7–7.7)
Neutrophils Relative %: 70 %
Platelets: 176 10*3/uL (ref 150–400)
RBC: 4.28 MIL/uL (ref 4.22–5.81)
RDW: 14.7 % (ref 11.5–15.5)
WBC: 7.7 10*3/uL (ref 4.0–10.5)

## 2015-08-06 LAB — LIPID PANEL
CHOL/HDL RATIO: 2.5 ratio
CHOLESTEROL: 68 mg/dL (ref 0–200)
HDL: 27 mg/dL — ABNORMAL LOW (ref >40)
LDL Cholesterol: 10 mg/dL (ref 0–99)
TRIGLYCERIDES: 154 mg/dL — AB (ref <150)
VLDL: 31 mg/dL (ref 0–40)

## 2015-08-07 LAB — HEMOGLOBIN A1C
HEMOGLOBIN A1C: 6.4 % — AB (ref 4.8–5.6)
Mean Plasma Glucose: 137 mg/dL

## 2015-08-16 ENCOUNTER — Encounter: Payer: Self-pay | Admitting: Internal Medicine

## 2015-08-18 ENCOUNTER — Encounter (HOSPITAL_COMMUNITY): Payer: Self-pay

## 2015-09-11 ENCOUNTER — Other Ambulatory Visit: Payer: Self-pay | Admitting: *Deleted

## 2015-09-11 MED ORDER — TRAMADOL HCL 50 MG PO TABS: 50.0000 mg | ORAL_TABLET | Freq: Three times a day (TID) | ORAL | Status: AC | PRN

## 2015-09-23 ENCOUNTER — Encounter: Payer: Self-pay | Admitting: Internal Medicine

## 2015-09-23 ENCOUNTER — Non-Acute Institutional Stay (SKILLED_NURSING_FACILITY): Payer: PPO | Admitting: Internal Medicine

## 2015-09-23 DIAGNOSIS — E119 Type 2 diabetes mellitus without complications: Secondary | ICD-10-CM | POA: Diagnosis not present

## 2015-09-23 DIAGNOSIS — Z961 Presence of intraocular lens: Secondary | ICD-10-CM | POA: Diagnosis not present

## 2015-09-23 DIAGNOSIS — I350 Nonrheumatic aortic (valve) stenosis: Secondary | ICD-10-CM | POA: Diagnosis not present

## 2015-09-23 DIAGNOSIS — H353131 Nonexudative age-related macular degeneration, bilateral, early dry stage: Secondary | ICD-10-CM | POA: Diagnosis not present

## 2015-09-23 DIAGNOSIS — I10 Essential (primary) hypertension: Secondary | ICD-10-CM

## 2015-09-23 DIAGNOSIS — Z8673 Personal history of transient ischemic attack (TIA), and cerebral infarction without residual deficits: Secondary | ICD-10-CM | POA: Diagnosis not present

## 2015-09-23 DIAGNOSIS — J42 Unspecified chronic bronchitis: Secondary | ICD-10-CM | POA: Diagnosis not present

## 2015-09-23 DIAGNOSIS — Z7901 Long term (current) use of anticoagulants: Secondary | ICD-10-CM | POA: Diagnosis not present

## 2015-09-23 NOTE — Progress Notes (Signed)
Patient ID: George Cook, male   DOB: 09-Mar-1928, 82 y.o.   MRN: [id number]  Location:  [facility]   Place of Service:  SNF [phone])  Dwana Melena, MD  Patient Care Team: Benita Stabile, MD as PCP - General (Internal Medicine) Roma Kayser, MD (Endocrinology) Laqueta Linden, MD as Attending Physician (Cardiology)  Extended Emergency Contact Information Primary Emergency Contact: Newbold,Doris J Address: [address]          [place], [place] [address] [place] Home Phone: [phone] Relation: Spouse Secondary Emergency Contact: Safi,Jason Address: [address]           [place], [place] [address] [place] Home Phone: [phone] Mobile Phone: [phone] Relation: Son   Goals of care: Advanced Directive information Advanced Directives 09/23/2015  Does patient have an advance directive? Yes  Type of Advance Directive Out of facility DNR (pink MOST or yellow form)  Does patient want to make changes to advanced directive? No - Patient declined  Copy of advanced directive(s) in chart? Yes     Chief Complaint  Patient presents with  . Medical Management of Chronic Issues    HPI:  Pt is a 82 y.o. male seen today for medical management of chronic diseases.   He continues to be quite stable.  He does have a history of COPD-continues on albuterol nebulizers as needed this has been quite stable.  He was treated the hospital for pneumonia there has been no reoccurrence during his stay here.  He does have a history diabetes type 2 she is on Amaryl 1 mg twice a day as well as Glucophage 500 mg twice a day.    Morning sugars appear to run from the 60s to low 100s-later in the day appears to run more in the low 100's --2 higher 100s.  Regards to lower extremity edema this appears baseline has compression hose on he is on Bumex 1 mg a day.  He does have a history of grade 1 diastolic dysfunction per echo in July 2016 his systolic function was  within normal limits.  Clinically he appears to be stable weight is 190 and this appears to be unstable here over the past month average appears to be in the higher 180s to 190.  Regards to anxiety continues on Xanax still 0.25 mg twice a day when necessary.  .  He does have a history CVA thought to be embolic she continues on Eliquis 5 mg twice a day.  Per cardiology notes every 216 does have a history of bilateral pulmonary emboli and right lower extremity DVT.  One point he had been on Xarelto but this was switched Eliquis I think this was a reimbursement issue.  He has no complaints not complaining of shortness of breath chest pain any abdominal discomfort he is doing quite well with supportive care here --his  wife is with him in the room today she is extremely supportive  Past Medical History  Diagnosis Date  . HTN (hypertension)   . DM (diabetes mellitus) (HCC)   . Carotid artery occlusion     a. 05/2012 U/S Bilat ICA 20-39%.  . Obesity   . Stroke [facility])     a. 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 01/2009 TEE: EF 60-65%, mild MR, no LA/LAA thrombus.  . Macular degeneration   . Hiatal hernia   . Nephrolithiasis    Past Surgical History  Procedure Laterality Date  . Finger surgery Left  pinky, after saw cut it off  . Cataract extraction w/phaco Right 10/02/2012    Procedure: CATARACT EXTRACTION PHACO AND INTRAOCULAR LENS PLACEMENT (IOC);  Surgeon: Gemma Payor, MD;  Location: [facility];  Service: Ophthalmology;  Laterality: Right;  CDE 12.78  . Eye surgery    . Cataract extraction w/phaco Left 10/30/2012    Procedure: CATARACT EXTRACTION PHACO AND INTRAOCULAR LENS PLACEMENT (IOC);  Surgeon: Gemma Payor, MD;  Location: [facility];  Service: Ophthalmology;  Laterality: Left;  CDE: 19.10  . Left heart catheterization with coronary angiogram Bilateral 03/08/2013    Procedure: LEFT HEART CATHETERIZATION WITH CORONARY ANGIOGRAM;  Surgeon: Laurey Morale, MD;  Location: [facility] CATH LAB;   Service: Cardiovascular;  Laterality: Bilateral;  . Tee without cardioversion N/A 12/16/2014    Procedure: TRANSESOPHAGEAL ECHOCARDIOGRAM (TEE);  Surgeon: Antoine Poche, MD;  Location: [facility] ENDO SUITE;  Service: Cardiology;  Laterality: N/A;  will need 40 minutes to clean scope between procedures    Allergies  Allergen Reactions  . Aspirin Hives and Rash      Medication List       This list is accurate as of: 09/23/15  2:26 PM.  Always use your most recent med list.               albuterol (2.5 MG/3ML) 0.083% nebulizer solution  Commonly known as:  PROVENTIL  1 inhalation four times a day 8 am, 12  Pm, 4 pm, 9 pm for wheezing and SOB     albuterol (2.5 MG/3ML) 0.083% nebulizer solution  Commonly known as:  PROVENTIL  Take 3 mLs (2.5 mg total) by nebulization every 2 (two) hours as needed for wheezing or shortness of breath.     atorvastatin 10 MG tablet  Commonly known as:  LIPITOR  Take 10 mg by mouth daily.     B-12 1000 MCG Tbcr  Take 1 tablet by mouth 2 (two) times daily.     bumetanide 2 MG tablet  Commonly known as:  BUMEX  Take 1 mg by mouth daily as needed (for fluid).     carvedilol 3.125 MG tablet  Commonly known as:  COREG  TAKE 1 TABLET BY MOUTH TWICE DAILY WITH MEALS.     donepezil 10 MG tablet  Commonly known as:  ARICEPT  Take 10 mg by mouth daily after supper.     ELIQUIS 5 MG Tabs tablet  Generic drug:  apixaban  Take 5 mg by mouth 2 (two) times daily.     glimepiride 2 MG tablet  Commonly known as:  AMARYL  Take 1 mg by mouth 2 (two) times daily.     lisinopril 5 MG tablet  Commonly known as:  PRINIVIL,ZESTRIL  Take 1 tablet (5 mg total) by mouth daily.     loperamide 2 MG capsule  Commonly known as:  IMODIUM  Take 2 mg by mouth as needed for diarrhea or loose stools.     LORazepam 0.5 MG tablet  Commonly known as:  ATIVAN  Take 0.25 mg by mouth 2 (two) times daily. 0.25 mg daily at bedtime PRN and 0.25 mg dailyPRN     metFORMIN 500  MG tablet  Commonly known as:  GLUCOPHAGE  TAKE (1) TABLET BY MOUTH TWICE DAILY.     multivitamin with minerals Tabs tablet  Take 1 tablet by mouth daily.     nitroGLYCERIN 0.4 MG SL tablet  Commonly known as:  NITROSTAT  Place 1 tablet (0.4 mg total) under the tongue  every 5 (five) minutes as needed for chest pain.     potassium chloride 10 MEQ CR capsule  Commonly known as:  MICRO-K  Take 10 mEq by mouth daily.     traMADol 50 MG tablet  Commonly known as:  ULTRAM  Take 1 tablet (50 mg total) by mouth 3 (three) times daily as needed for moderate pain. For pain     vitamin C 1000 MG tablet  Take 1,500 mg by mouth daily.     Vitamin D 2000 units Caps  Take 1 capsule by mouth daily after supper.        Review of Systems  Somewhat limited secondary to dementia provided by nursing patient and wife.  General no complaints of fever chills skin does not complain of rashes or itching.  Head ears eyes nose mouth throat is not complaining of any sore throat or visual changes.  Respiratory does not complain of shortness of breath history COPD but this is been quite stable does not complain of increased cough.  Cardiac does not complaining of any chest pain.  GI is not complaining of abdominal discomfort nausea vomiting diarrhea or constipation.  GU does not complain of dysuria.  Muscle skeletal has lower extremity weakness but does not complaining of joint pain.  Neurologic is not complaining of dizziness headache or syncopal-type episodes.  Psyche is oriented to self is pleasant and appropriate does well with supportive care I do not see any documentation of agitation.    There is no immunization history on file for this patient. Pertinent  Health Maintenance Due  Topic Date Due  . FOOT EXAM  02/05/1940  . OPHTHALMOLOGY EXAM  02/05/1940  . PNA vac Low Risk Adult (1 of 2 - PCV13) 02/05/1995  . INFLUENZA VACCINE  12/23/2015  . HEMOGLOBIN A1C  02/06/2016   No flowsheet  data found. Functional Status Survey:    Filed Vitals:   09/23/15 1401  BP: 107/63  Pulse: 73  Temp: 96.6 F (35.9 C)  TempSrc: Oral  Resp: 20  Height: 5\' 10"  (1.778 m)  Weight: 190 lb (86.183 kg)   Body mass index is 27.26 kg/(m^2). Physical Exam   In general this is a pleasant LE male in no distress resting comfortably in bed.  His skin is warm and dry.  Oropharynx clear mucous membranes moist.  Eyes are equal round reactive to light sclerae and conjunctivae clear visual acuity appears grossly intact.  Chest is clear to auscultation with prolonged respiratory phase with history of COPD there is no labored breathing.  Heart is regular rate and rhythm with occasional irregular beats there is a 3/6 systolic ejection murmur.  He appears to have baseline 1-2+ lower edema he has compression hose on.  Abdomen is soft nontender with positive bowel sounds.  Extremities he does have continual lower extremity weakness I did not note any changes from baseline upper extremity strength appears to be preserved.  Neurologic no lateralizing findings her speech is clear no focal deficits.   psych he is oriented to self -- is pleasant and follows simple verbal commands dementia I would classify  as moderate.    Labs reviewed:  Recent Labs  05/14/15 0715 05/19/15 0600 08/06/15 0745  NA 142 138 138  K 4.1 4.6 4.4  CL 103 100* 102  CO2 32 32 29  GLUCOSE 111* 108* 80  BUN 22* 16 22*  CREATININE 0.85 0.94 0.80  CALCIUM 8.5* 8.9 8.5*    Recent Labs  12/13/14  1149 05/07/15 0740 08/06/15 0745  AST 22 25 17   ALT 15* 16* 13*  ALKPHOS 36* 46 34*  BILITOT 0.6 0.8 0.5  PROT 6.5 6.4* 5.5*  ALBUMIN 3.7 3.5 2.9*    Recent Labs  03/27/15 0710 05/07/15 0740 08/06/15 0745  WBC 11.0* 7.4 7.7  NEUTROABS 8.3* 5.2 5.4  HGB 13.3 13.1 12.0*  HCT 40.1 41.1 37.2*  MCV 85.3 88.0 86.9  PLT 284 216 176   Lab Results  Component Value Date   TSH 1.803 03/07/2013   Lab Results    Component Value Date   HGBA1C 6.4* 08/06/2015   Lab Results  Component Value Date   CHOL 68 08/06/2015   HDL 27* 08/06/2015   LDLCALC 10 08/06/2015   TRIG 154* 08/06/2015   CHOLHDL 2.5 08/06/2015       Assessment/Plan  1 history of COPD pneumonia this has been quite stable now for sometime he is nebulizers 4 times a day routinely as well as when necessary nebulizers appears to be doing well in this regard.  #2 history CVA thought to be embolic he is on Eliquis 5 mg twice a day again he does have history of bilateral pulmonary emboli and right lower extremity DVT as well.  #3 history of calcified aortic stenosis-this is thought to be moderate to severe-she's been asymptomatic essentially during his stay here.  #4 history diabetes type 2 continues to have somewhat blood sugars in the morning will decrease his Amaryl to 1 mg by mouth every morning and monitor.  Hemoglobin A1c was 6.4 in March.  #5 history of hyperlipidemia he is on Lipitor-cholesterol 68 HDL 27 LDL 10 on recent panel  #6-history of anxiety twice a day when necessary as well as routine Xanax 0.25 mg at at bedtime this appears to be stabilized.  #7 history of dementia this appears to be monitored he is doing well with supportive care he continues on Aricept.  #8 history hypertension she continues on Coreg as well as lisinopril this appears to be stable recent blood pressures 107/63-136/61.  #9 history of diastolic CHF this appears stable weight is around 190 this is been quite consistent he is on Bumex-with potassium supplementation Will update a metabolic panel.  Of note also will obtain a CBC as well as liver function tests for evaluation-since he is on a statin.  [id number]-of note greater than 35 minutes spent assessing patient-reviewing his chart-discussing his status with nursing staff as well as with his wife in the room-and coordinating and formulating a plan of care-of note greater than 50% of time spent  coordinating plan of care with chart review-lab reviewed--and assessment of numerous conditions

## 2015-09-24 ENCOUNTER — Encounter (HOSPITAL_COMMUNITY)
Admission: RE | Admit: 2015-09-24 | Discharge: 2015-09-24 | Disposition: A | Discharge status: Home / Self Care | Payer: PPO | Source: Skilled Nursing Facility | Attending: Internal Medicine | Admitting: Internal Medicine

## 2015-09-24 ENCOUNTER — Non-Acute Institutional Stay (SKILLED_NURSING_FACILITY): Payer: PPO | Admitting: Internal Medicine

## 2015-09-24 DIAGNOSIS — R262 Difficulty in walking, not elsewhere classified: Secondary | ICD-10-CM | POA: Insufficient documentation

## 2015-09-24 DIAGNOSIS — F0390 Unspecified dementia without behavioral disturbance: Secondary | ICD-10-CM | POA: Insufficient documentation

## 2015-09-24 DIAGNOSIS — M6281 Muscle weakness (generalized): Secondary | ICD-10-CM | POA: Diagnosis not present

## 2015-09-24 DIAGNOSIS — Z7901 Long term (current) use of anticoagulants: Secondary | ICD-10-CM | POA: Insufficient documentation

## 2015-09-24 DIAGNOSIS — I5030 Unspecified diastolic (congestive) heart failure: Secondary | ICD-10-CM

## 2015-09-24 DIAGNOSIS — E875 Hyperkalemia: Secondary | ICD-10-CM | POA: Diagnosis not present

## 2015-09-24 DIAGNOSIS — R278 Other lack of coordination: Secondary | ICD-10-CM | POA: Insufficient documentation

## 2015-09-24 LAB — CBC WITH DIFFERENTIAL/PLATELET
BASOS PCT: 0 %
Basophils Absolute: 0 10*3/uL (ref 0.0–0.1)
Eosinophils Absolute: 0.3 10*3/uL (ref 0.0–0.7)
Eosinophils Relative: 4 %
HEMATOCRIT: 39.8 % (ref 39.0–52.0)
HEMOGLOBIN: 12.7 g/dL — AB (ref 13.0–17.0)
LYMPHS ABS: 1.5 10*3/uL (ref 0.7–4.0)
Lymphocytes Relative: 22 %
MCH: 27.5 pg (ref 26.0–34.0)
MCHC: 31.9 g/dL (ref 30.0–36.0)
MCV: 86.3 fL (ref 78.0–100.0)
MONO ABS: 0.4 10*3/uL (ref 0.1–1.0)
MONOS PCT: 5 %
NEUTROS ABS: 4.8 10*3/uL (ref 1.7–7.7)
Neutrophils Relative %: 69 %
Platelets: 195 10*3/uL (ref 150–400)
RBC: 4.61 MIL/uL (ref 4.22–5.81)
RDW: 14 % (ref 11.5–15.5)
WBC: 7 10*3/uL (ref 4.0–10.5)

## 2015-09-24 LAB — COMPREHENSIVE METABOLIC PANEL
ALBUMIN: 3.3 g/dL — AB (ref 3.5–5.0)
ALK PHOS: 43 U/L (ref 38–126)
ALT: 14 U/L — ABNORMAL LOW (ref 17–63)
ANION GAP: 7 (ref 5–15)
AST: 16 U/L (ref 15–41)
BILIRUBIN TOTAL: 0.7 mg/dL (ref 0.3–1.2)
BUN: 24 mg/dL — ABNORMAL HIGH (ref 6–20)
CO2: 30 mmol/L (ref 22–32)
Calcium: 9.1 mg/dL (ref 8.9–10.3)
Chloride: 102 mmol/L (ref 101–111)
Creatinine, Ser: 0.88 mg/dL (ref 0.61–1.24)
Glucose, Bld: 97 mg/dL (ref 65–99)
POTASSIUM: 5.6 mmol/L — AB (ref 3.5–5.1)
Sodium: 139 mmol/L (ref 135–145)
Total Protein: 6.1 g/dL — ABNORMAL LOW (ref 6.5–8.1)

## 2015-09-24 NOTE — Progress Notes (Signed)
Patient ID: George Cook, male   DOB: 02/03/1928, 82 y.o.   MRN: [id number]    

## 2015-09-24 NOTE — Progress Notes (Signed)
Patient ID: George Cook, male   DOB:  18, 1929, 82 y.o.   MRN: [id number]

## 2015-09-25 LAB — HEMOGLOBIN A1C
Hgb A1c MFr Bld: 6.2 % — ABNORMAL HIGH (ref 4.8–5.6)
Mean Plasma Glucose: 131 mg/dL

## 2015-09-26 ENCOUNTER — Encounter (HOSPITAL_COMMUNITY)
Admission: RE | Admit: 2015-09-26 | Discharge: 2015-09-26 | Disposition: A | Discharge status: Home / Self Care | Payer: PPO | Source: Skilled Nursing Facility | Attending: *Deleted | Admitting: *Deleted

## 2015-09-26 DIAGNOSIS — R262 Difficulty in walking, not elsewhere classified: Secondary | ICD-10-CM | POA: Diagnosis not present

## 2015-09-26 LAB — BASIC METABOLIC PANEL
ANION GAP: 7 (ref 5–15)
BUN: 24 mg/dL — ABNORMAL HIGH (ref 6–20)
CALCIUM: 9 mg/dL (ref 8.9–10.3)
CO2: 31 mmol/L (ref 22–32)
Chloride: 99 mmol/L — ABNORMAL LOW (ref 101–111)
Creatinine, Ser: 0.98 mg/dL (ref 0.61–1.24)
GFR calc non Af Amer: >60 mL/min (ref >60)
Glucose, Bld: 180 mg/dL — ABNORMAL HIGH (ref 65–99)
Potassium: 4.6 mmol/L (ref 3.5–5.1)
Sodium: 137 mmol/L (ref 135–145)

## 2015-09-28 NOTE — Progress Notes (Signed)
Patient ID: George Cook, male   DOB: 08-Jun-1927, 82 y.o.   MRN: [id number]    Location:  [facility] Room Number: 139 Place of Service:  SNF [phone])  Dwana Melena, MD  Patient Care Team: Benita Stabile, MD as PCP - General (Internal Medicine) Roma Kayser, MD (Endocrinology) Laqueta Linden, MD as Attending Physician (Cardiology)  Extended Emergency Contact Information Primary Emergency Contact: Lippert,Doris J Address: [address]          [place], [place] [address] Darden [place] of [place] Home Phone: [phone] Relation: Spouse Secondary Emergency Contact: Mitro,Jason Address: [address]           [place], [place] [address] D[place] of [place] Home Phone: [phone] Mobile Phone: [phone] Relation: Son   Goals of care: Advanced Directive information Advanced Directives 09/24/2015  Does patient have an advance directive? Yes  Type of Advance Directive Out of facility DNR (pink MOST or yellow form)  Does patient want to make changes to advanced directive? No - Patient declined  Copy of advanced directive(s) in chart? Yes  Pre-existing out of facility DNR order (yellow form or pink MOST form) Yellow form placed in chart (order not valid for inpatient use)     Chief Complaint  Patient presents with  . Acute Visit    Increase Potassium    HPI:  Pt is a 82 y.o. male who has been quite stable-she does have a history of grade 1 diastolic dysfunction is on Bumex 1 mg a day as well as potassium 10 mEq a day as well as lisinopril 5 mg a day and Coreg 3.125 mg twice a day.  Routine lab was ordered which is come back showing a potassium of 5.6  Currently he is stable-vital signs appear unremarkable previous potassiums have been within normal range     Past Medical History  Diagnosis Date  . HTN (hypertension)   . DM (diabetes mellitus) (HCC)   . Carotid artery occlusion     a. 05/2012 U/S Bilat ICA 20-39%.  . Obesity   . Stroke [facility])     a.  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 01/2009 TEE: EF 60-65%, mild MR, no LA/LAA thrombus.  . Macular degeneration   . Hiatal hernia   . Nephrolithiasis    Past Surgical History  Procedure Laterality Date  . Finger surgery Left     pinky, after saw cut it off  . Cataract extraction w/phaco Right 10/02/2012    Procedure: CATARACT EXTRACTION PHACO AND INTRAOCULAR LENS PLACEMENT (IOC);  Surgeon: Gemma Payor, MD;  Location: [facility] ORS;  Service: Ophthalmology;  Laterality: Right;  CDE 12.78  . Eye surgery    . Cataract extraction w/phaco Left 10/30/2012    Procedure: CATARACT EXTRACTION PHACO AND INTRAOCULAR LENS PLACEMENT (IOC);  Surgeon: Gemma Payor, MD;  Location: [facility] ORS;  Service: Ophthalmology;  Laterality: Left;  CDE: 19.10  . Left heart catheterization with coronary angiogram Bilateral 03/08/2013    Procedure: LEFT HEART CATHETERIZATION WITH CORONARY ANGIOGRAM;  Surgeon: Laurey Morale, MD;  Location: [facility] CATH LAB;  Service: Cardiovascular;  Laterality: Bilateral;  . Tee without cardioversion N/A 12/16/2014    Procedure: TRANSESOPHAGEAL ECHOCARDIOGRAM (TEE);  Surgeon: Antoine Poche, MD;  Location: [facility] ENDO SUITE;  Service: Cardiology;  Laterality: N/A;  will need 40 minutes to clean scope between procedures    Allergies  Allergen Reactions  . Aspirin Hives and Rash      Medication List  This list is accurate as of: 09/24/15 11:59 PM.  Always use your most recent med list.               albuterol (2.5 MG/3ML) 0.083% nebulizer solution  Commonly known as:  PROVENTIL  1 inhalation four times a day 8 am, 12  Pm, 4 pm, 9 pm for wheezing and SOB     albuterol (2.5 MG/3ML) 0.083% nebulizer solution  Commonly known as:  PROVENTIL  Take 3 mLs (2.5 mg total) by nebulization every 2 (two) hours as needed for wheezing or shortness of breath.     ALOE VESTA PROTECTIVE Oint  Apply to sacrum and bilateral buttock three times daily     atorvastatin 10 MG tablet  Commonly known as:   LIPITOR  Take 10 mg by mouth daily.     B-12 1000 MCG Tbcr  Take 1 tablet by mouth 2 (two) times daily.     bumetanide 1 MG tablet  Commonly known as:  BUMEX  Take 1 mg by mouth daily.     carvedilol 3.125 MG tablet  Commonly known as:  COREG  TAKE 1 TABLET BY MOUTH TWICE DAILY WITH MEALS.     donepezil 10 MG tablet  Commonly known as:  ARICEPT  Take 10 mg by mouth daily after supper.     ELIQUIS 5 MG Tabs tablet  Generic drug:  apixaban  Take 5 mg by mouth 2 (two) times daily.     ENSURE  Take one by mouth once daily     glimepiride 1 MG tablet  Commonly known as:  AMARYL  Take one tablet by mouth once daily     lisinopril 5 MG tablet  Commonly known as:  PRINIVIL,ZESTRIL  Take 1 tablet (5 mg total) by mouth daily.     loperamide 2 MG capsule  Commonly known as:  IMODIUM  Take 2 mg by mouth as needed for diarrhea or loose stools.     LORazepam 0.5 MG tablet  Commonly known as:  ATIVAN  Take 0.25 mg by mouth 2 (two) times daily. 0.25 mg daily at bedtime PRN and 0.25 mg dailyPRN     metFORMIN 500 MG tablet  Commonly known as:  GLUCOPHAGE  TAKE (1) TABLET BY MOUTH TWICE DAILY.     multivitamin with minerals Tabs tablet  Take 1 tablet by mouth daily.     nitroGLYCERIN 0.4 MG SL tablet  Commonly known as:  NITROSTAT  Place 1 tablet (0.4 mg total) under the tongue every 5 (five) minutes as needed for chest pain.     potassium chloride 10 MEQ CR capsule  Commonly known as:  MICRO-K  Take 10 mEq by mouth daily.     traMADol 50 MG tablet  Commonly known as:  ULTRAM  Take 1 tablet (50 mg total) by mouth 3 (three) times daily as needed for moderate pain. For pain     vitamin C 1000 MG tablet  Take 1,500 mg by mouth daily.     Vitamin D 2000 units Caps  Take 1 capsule by mouth daily after supper.        Review of Systems  Somewhat limited secondary to dementia provided by nursing patient   General no complaints of fever chills skin does not complain of  rashes or itching.  Head ears eyes nose mouth throat is not complaining of any sore throat or visual changes.  Respiratory does not complain of shortness of breath history COPD but this is  been quite stable does not complain of increased cough.  Cardiac does not complaining of any chest pain.  GI is not complaining of abdominal discomfort nausea vomiting diarrhea or constipation.  GU does not complain of dysuria.  Muscle skeletal has lower extremity weakness but does not complaining of joint pain.  Neurologic is not complaining of dizziness headache or syncopal-type episodes.  Psyche is oriented to self is pleasant and appropriate does well with supportive care     There is no immunization history on file for this patient. Pertinent  Health Maintenance Due  Topic Date Due  . FOOT EXAM  02/05/1940  . OPHTHALMOLOGY EXAM  02/05/1940  . PNA vac Low Risk Adult (1 of 2 - PCV13) 02/05/1995  . INFLUENZA VACCINE  12/23/2015  . HEMOGLOBIN A1C  03/26/2016   No flowsheet data found. Functional Status Survey:    Filed Vitals:   09/24/15 1504  BP: 136/81  Pulse: 73  Temp: 96.6 F (35.9 C)  TempSrc: Oral  Resp: 20  Height: 5\' 10"  (1.778 m)  Weight: 190 lb (86.183 kg)   Body mass index is 27.26 kg/(m^2). Physical Exam   In general this is a pleasant LE male in no distress   His skin is warm and dry.  Oropharynx clear mucous membranes moist.  Eyes are equal round reactive to light sclerae and conjunctivae clear visual acuity appears grossly intact.  Chest is clear to auscultation with prolonged respiratory phase with history of COPD there is no labored breathing.  Heart is regular rate and rhythm with occasional irregular beats there is a 3/6 systolic ejection murmur.  He appears to have baseline 1-2+ lower edema he has compression hose on.  Abdomen is soft nontender with positive bowel sounds.  Extremities he does have continual lower extremity weakness I did not note  any changes from baseline upper extremity strength appears to be preserved.  Neurologic no lateralizing findings --speech is clear no focal deficits.   psych he is oriented to self -- is pleasant and follows simple verbal commands dementia I would classify  as moderate.    Labs reviewed:  09/24/2015.  Sodium 139 potassium 5.6 BUN 24 creatinine 0.88.  ALT 14-albumin 3.3-otherwise liver function tests within normal limits   Recent Labs  05/07/15 0740 08/06/15 0745 09/24/15 0720  AST 25 17 16   ALT 16* 13* 14*  ALKPHOS 46 34* 43  BILITOT 0.8 0.5 0.7  PROT 6.4* 5.5* 6.1*  ALBUMIN 3.5 2.9* 3.3*    Recent Labs  05/07/15 0740 08/06/15 0745 09/24/15 0720  WBC 7.4 7.7 7.0  NEUTROABS 5.2 5.4 4.8  HGB 13.1 12.0* 12.7*  HCT 41.1 37.2* 39.8  MCV 88.0 86.9 86.3  PLT 216 176 195   Lab Results  Component Value Date   TSH 1.803 03/07/2013   Lab Results  Component Value Date   HGBA1C 6.2* 09/24/2015   Lab Results  Component Value Date   CHOL 68 08/06/2015   HDL 27* 08/06/2015   LDLCALC 10 08/06/2015   TRIG 154* 08/06/2015   CHOLHDL 2.5 08/06/2015       Assessment/Plan  History of hyperkalemia-with potassium 5.6 this appears [email or web address]  Previous labs he is on low-dose potassium 10 mEq a day as well as lisinopril 5 mg a day at this point will hold these pending an updated lab which will be done tomorrow--clinically he appears to be stable and has been stable now for some time.  Also will update a basic metabolic panel  early next week to ensure stability.  [id number]

## 2015-10-01 ENCOUNTER — Encounter (HOSPITAL_COMMUNITY)
Admission: RE | Admit: 2015-10-01 | Discharge: 2015-10-01 | Disposition: A | Discharge status: Home / Self Care | Payer: Medicare Other | Source: Skilled Nursing Facility | Attending: Internal Medicine | Admitting: Internal Medicine

## 2015-10-01 DIAGNOSIS — I10 Essential (primary) hypertension: Secondary | ICD-10-CM | POA: Diagnosis present

## 2015-10-01 LAB — BASIC METABOLIC PANEL
Anion gap: 11 (ref 5–15)
BUN: 22 mg/dL — AB (ref 6–20)
CHLORIDE: 98 mmol/L — AB (ref 101–111)
CO2: 27 mmol/L (ref 22–32)
Calcium: 9 mg/dL (ref 8.9–10.3)
Creatinine, Ser: 0.98 mg/dL (ref 0.61–1.24)
GFR calc Af Amer: >60 mL/min (ref >60)
Glucose, Bld: 207 mg/dL — ABNORMAL HIGH (ref 65–99)
POTASSIUM: 4.5 mmol/L (ref 3.5–5.1)
SODIUM: 136 mmol/L (ref 135–145)

## 2015-10-28 ENCOUNTER — Encounter: Payer: Self-pay | Admitting: Internal Medicine

## 2015-10-28 ENCOUNTER — Non-Acute Institutional Stay (SKILLED_NURSING_FACILITY): Payer: PPO | Admitting: Internal Medicine

## 2015-10-28 DIAGNOSIS — I10 Essential (primary) hypertension: Secondary | ICD-10-CM | POA: Diagnosis not present

## 2015-10-28 DIAGNOSIS — I82401 Acute embolism and thrombosis of unspecified deep veins of right lower extremity: Secondary | ICD-10-CM

## 2015-10-28 DIAGNOSIS — J42 Unspecified chronic bronchitis: Secondary | ICD-10-CM | POA: Diagnosis not present

## 2015-10-28 DIAGNOSIS — R609 Edema, unspecified: Secondary | ICD-10-CM | POA: Diagnosis not present

## 2015-10-28 DIAGNOSIS — R350 Frequency of micturition: Secondary | ICD-10-CM

## 2015-10-28 NOTE — Progress Notes (Signed)
Location:  [facility] Room Number: 139/D Place of Service:  SNF [phone]) Provider:  Phylis Bougie, MD  Patient Care Team: Benita Stabile, MD as PCP - General (Internal Medicine) Roma Kayser, MD (Endocrinology) Laqueta Linden, MD as Attending Physician (Cardiology)  Extended Emergency Contact Information Primary Emergency Contact: Hipple,Doris J Address: [address]          [place], [place] [address] D[place] of [place] Home Phone: [phone] Relation: Spouse Secondary Emergency Contact: Melander,Jason Address: [address]           [place], [place] [address] D[place] of [place] Home Phone: [phone] Mobile Phone: [phone] Relation: Son  Code Status:  DNR Goals of care: Advanced Directive information Advanced Directives 10/28/2015  Does patient have an advance directive? Yes  Type of Advance Directive Out of facility DNR (pink MOST or yellow form)  Does patient want to make changes to advanced directive? No - Patient declined  Copy of advanced directive(s) in chart? Yes  Pre-existing out of facility DNR order (yellow form or pink MOST form) -     Chief Complaint  Patient presents with  . Acute Visit    Patients c/o He wants something for urinairy  frequency   Medical management of chronic medical issues including diabetes type 2-history CVA-chronic lower extremity edema-COPD-dementia  HPI:  Pt is a 82 y.o. male seen today for an acute visit for complaints of increased urinary frequency he does not really complain of burning just says he's urinating more.  In regards to his chronic medical issues these appear to be relatively stable he does have a history COPD as well as a history of dementia.  He required hospitalization at one point for COPD exasperation but this has been stable for a considerable one time now.  Regards anticoagulation he continues on Eliquist twice a day does a history of pulmonary embolism right leg DVT and   coronary artery disease.  He continues with chronic lower extremity edema which appears to be relatively stable he does have a history of grade 1 diastolic dysfunction per echo in July 2016.  He is on Lasix 20 mg a day weights appear to be stable edema appears to be stable he does wear compression hose.  Regards to diabetes type 2 he is on Glucophage 500 mg twice a day--his Amaryl has been discontinued-blood sugars appear to be stable largely in the mid 100s in the morning-at at bedtime some more variability mid 100s to lower 200s at this point will monitor would like to avoid hypoglycemia--dated hemoglobin A1c has been ordered for August 2017  Hemoglobin A1c was 6.2 on 09/24/2015.    Past Medical History  Diagnosis Date  . HTN (hypertension)   . DM (diabetes mellitus) (HCC)   . Carotid artery occlusion     a. 05/2012 U/S Bilat ICA 20-39%.  . Obesity   . Stroke [facility])     a. 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 01/2009 TEE: EF 60-65%, mild MR, no LA/LAA thrombus.  . Macular degeneration   . Hiatal hernia   . Nephrolithiasis    Past Surgical History  Procedure Laterality Date  . Finger surgery Left     pinky, after saw cut it off  . Cataract extraction w/phaco Right 10/02/2012    Procedure: CATARACT EXTRACTION PHACO AND INTRAOCULAR LENS PLACEMENT (IOC);  Surgeon: Gemma Payor, MD;  Location: [facility] ORS;  Service: Ophthalmology;  Laterality: Right;  CDE 12.78  .  Eye surgery    . Cataract extraction w/phaco Left 10/30/2012    Procedure: CATARACT EXTRACTION PHACO AND INTRAOCULAR LENS PLACEMENT (IOC);  Surgeon: Gemma Payor, MD;  Location: [facility];  Service: Ophthalmology;  Laterality: Left;  CDE: 19.10  . Left heart catheterization with coronary angiogram Bilateral 03/08/2013    Procedure: LEFT HEART CATHETERIZATION WITH CORONARY ANGIOGRAM;  Surgeon: Laurey Morale, MD;  Location: [facility] CATH LAB;  Service: Cardiovascular;  Laterality: Bilateral;  . Tee without cardioversion N/A 12/16/2014     Procedure: TRANSESOPHAGEAL ECHOCARDIOGRAM (TEE);  Surgeon: Antoine Poche, MD;  Location: [facility] ENDO SU[facility];  Service: Cardiology;  Laterality: N/A;  will need 40 minutes to clean scope between procedures    Allergies  Allergen Reactions  . Aspirin Hives and Rash      Medication List       This list is accurate as of: 10/28/15  1:53 PM.  Always use your most recent med list.               albuterol (2.5 MG/3ML) 0.083% nebulizer solution  Commonly known as:  PROVENTIL  1 inhalation four times a day 8 am, 12  Pm, 4 pm, 9 pm for wheezing and SOB     ALOE VESTA PROTECTIVE Oint  Apply to sacrum and bilateral buttock three times daily     atorvastatin 10 MG tablet  Commonly known as:  LIPITOR  Take 10 mg by mouth daily.     B-12 1000 MCG Tbcr  Take 1 tablet by mouth 2 (two) times daily.     carvedilol 3.125 MG tablet  Commonly known as:  COREG  TAKE 1 TABLET BY MOUTH TWICE DAILY WITH MEALS.     donepezil 10 MG tablet  Commonly known as:  ARICEPT  Take 10 mg by mouth daily after supper.     ELIQUIS 5 MG Tabs tablet  Generic drug:  apixaban  Take 5 mg by mouth 2 (two) times daily.     ENSURE  Take one by mouth once daily     furosemide 20 MG tablet  Commonly known as:  LASIX  Take 20 mg by mouth daily.     lisinopril 5 MG tablet  Commonly known as:  PRINIVIL,ZESTRIL  Take 1 tablet (5 mg total) by mouth daily.     LORazepam 0.5 MG tablet  Commonly known as:  ATIVAN  Take 0.25 mg by mouth 2 (two) times daily.     metFORMIN 500 MG tablet  Commonly known as:  GLUCOPHAGE  TAKE (1) TABLET BY MOUTH TWICE DAILY.     multivitamin with minerals Tabs tablet  Take 1 tablet by mouth daily.     nitroGLYCERIN 0.4 MG SL tablet  Commonly known as:  NITROSTAT  Place 1 tablet (0.4 mg total) under the tongue every 5 (five) minutes as needed for chest pain.     OXYGEN  2L/ mn via N/C prn to maintain stas >90 % Every shift     traMADol 50 MG tablet  Commonly known as:  ULTRAM   Take 1 tablet (50 mg total) by mouth 3 (three) times daily as needed for moderate pain. For pain     vitamin C 1000 MG tablet  Take 1,500 mg by mouth daily.     Vitamin D 2000 units Caps  Take 1 capsule by mouth daily after supper.        Review of Systems   This is limited secondary to patient's dementia provided by patient  and nursing staff.  General no complaints of fever or chills weight appears to be stable.  Skin is not complaining of any rashes or itching.  Head ears eyes nose mouth and throat does not complain of any sore throat or visual changes.  Respiratory does have a history of COPD but has been stable here does not complain of increased cough or shortness of breath.  Cardiac does not complain of chest pain has chronic lower extremity edema which appears to be stable.  GI is not complaining of any abdominal discomfort nausea vomiting diarrhea or constipation.  GU he is complaining of increased frequency but does not complain of burning.  Muscle skeletal ambulates in a wheelchair he is not complaining of joint pain currently.  Neurologic does not complain of dizziness headache or syncopal-type feelings.  Psych does have his dementia but appears to be doing well with supportive care no behaviors have been noted he is  pleasantly confused at times.     There is no immunization history on file for this patient. Pertinent  Health Maintenance Due  Topic Date Due  . FOOT EXAM  10/27/2016 (Originally 02/05/1940)  . OPHTHALMOLOGY EXAM  10/27/2016 (Originally [birth year]05/1940)  . PNA vac Low Risk Adult (1 of 2 - PCV13) 10/27/2016 (Originally 02/05/1995)  . INFLUENZA VACCINE  12/23/2015  . HEMOGLOBIN A1C  03/26/2016   No flowsheet data found. Functional Status Survey:    Filed Vitals:   10/28/15 1323  BP: 122/57  Pulse: 64  Temp: 97.8 F (36.6 C)  TempSrc: Oral  Resp: 20  Height: 5\' 11"  (1.803 m)  Weight: 192 lb 9.6 oz (87.363 kg)  SpO2: 96%   Body mass index  is 26.87 kg/(m^2). Physical Exam   In general this is a pleasant elderly male in no distress resting comfortably in bed.  His skin is warm and dry. Eyes pupils appear reactive light sclera and conjunctiva are clear visual acuity is grossly intact.  Chest is clear to auscultation with a prolonged expiratory phase which is not new no labored breathing.  Heart is regular rate and rhythm with occasional irregular beats he does have a 3/6 systolic murmur which is baseline.  He has chronic lower extremity edema I would say one plus today he has compression hose on pedal pulses are intact.  His abdomen is soft nontender with positive bowel sounds.  GU could not really appreciate any suprapubic distention or tenderness cannot appreciate drainage or erythema or rash  Extremities has lower extremity weakness which appears to be baseline upper extremity strength appears to be preserved he ambulates in a wheelchair which is his baseline.  Neurologic is grossly intact speech is clear although he is somewhat confused pleasantly-no lateralizing findings.  Psych he is oriented to self pleasant conversant  Labs reviewed:  Recent Labs  09/24/15 0720 09/26/15 1700 10/01/15 1100  NA 139 137 136  K 5.6* 4.6 4.5  CL 102 99* 98*  CO2 30 31 27   GLUCOSE 97 180* 207*  BUN 24* 24* 22*  CREATININE 0.88 0.98 0.98  CALCIUM 9.1 9.0 9.0    Recent Labs  05/07/15 0740 08/06/15 0745 09/24/15 0720  AST 25 17 16   ALT 16* 13* 14*  ALKPHOS 46 34* 43  BILITOT 0.8 0.5 0.7  PROT 6.4* 5.5* 6.1*  ALBUMIN 3.5 2.9* 3.3*    Recent Labs  05/07/15 0740 08/06/15 0745 09/24/15 0720  WBC 7.4 7.7 7.0  NEUTROABS 5.2 5.4 4.8  HGB 13.1 12.0* 12.7*  HCT  41.1 37.2* 39.8  MCV 88.0 86.9 86.3  PLT 216 176 195   Lab Results  Component Value Date   TSH 1.803 03/07/2013   Lab Results  Component Value Date   HGBA1C 6.2* 09/24/2015   Lab Results  Component Value Date   CHOL 68 08/06/2015   HDL 27*  08/06/2015   LDLCALC 10 08/06/2015   TRIG 154* 08/06/2015   CHOLHDL 2.5 08/06/2015    Significant Diagnostic Results in last 30 days:  No results found.  Assessment/Plan  #1-history of urinary frequency-before adding a urinary agent would like to check a UA C&S to rule out infectious etiology here will await those results before starting any medication.  #2 history COPD with pneumonia this has been stable for a period of time now he continues on albuterol nebulizers 4 times a day.  #3 history of grade 1 diastolic CHF his weight appears to be stable he has recently been switched from Bumex to Lasix appears tolerated this well will update a metabolic panel.  #4 history CVA thought to be embolic he is on Eliquist 5 mg twice a day.  #5-history of calcified aortic stenosis-this is thought to be moderate to severe-continues to be asymptomatic again he is on anticoagulation.  #6 history of diabetes type 2k continues only on Glucophage his Amaryl has been discontinued secondary to concerns about low blood sugars-hemoglobin A1c was 6.2 in early May-CBGs as noted above at this point will monitor he has an updated hemoglobin A1c ordered for August 2017.  #7 history of hyperlipidemia continues on Lipitor LDL was noted to be 10 and lab done on March appears to be tolerating Lipitor well liver function tests recently were unremarkable at this point will monitor.  Dementia appears to be moderate he is on Aricept and was supportive care he appears to be in quite well with this has done quite well in this facility.  #9 history of hypertension continues on Coreg as well as lisinopril this is stable recent blood pressures systolics running 120s to 130s diastolics in the 50s to 70s-at this point will monitor.  [id number] note greater than 40 minutes spent assessing patient-discussing his concerns at bedside-reviewing his chart-his labs-and coordinating and formulating a plan of care for numerous  diagnoses-of note greater than 50% of time spent coordinating plan of care   .      London Sheer, [place] [phone]

## 2015-10-29 ENCOUNTER — Encounter (HOSPITAL_COMMUNITY)
Admission: RE | Admit: 2015-10-29 | Discharge: 2015-10-29 | Disposition: A | Discharge status: Home / Self Care | Payer: PPO | Source: Skilled Nursing Facility | Attending: Internal Medicine | Admitting: Internal Medicine

## 2015-10-29 DIAGNOSIS — F0390 Unspecified dementia without behavioral disturbance: Secondary | ICD-10-CM | POA: Insufficient documentation

## 2015-10-29 DIAGNOSIS — I2782 Chronic pulmonary embolism: Secondary | ICD-10-CM | POA: Insufficient documentation

## 2015-10-29 DIAGNOSIS — I10 Essential (primary) hypertension: Secondary | ICD-10-CM | POA: Diagnosis not present

## 2015-10-29 DIAGNOSIS — I2510 Atherosclerotic heart disease of native coronary artery without angina pectoris: Secondary | ICD-10-CM | POA: Insufficient documentation

## 2015-10-29 LAB — BASIC METABOLIC PANEL
ANION GAP: 5 (ref 5–15)
BUN: 17 mg/dL (ref 6–20)
CHLORIDE: 101 mmol/L (ref 101–111)
CO2: 32 mmol/L (ref 22–32)
Calcium: 8.8 mg/dL — ABNORMAL LOW (ref 8.9–10.3)
Creatinine, Ser: 0.84 mg/dL (ref 0.61–1.24)
GFR calc non Af Amer: >60 mL/min (ref >60)
GLUCOSE: 149 mg/dL — AB (ref 65–99)
Potassium: 4.2 mmol/L (ref 3.5–5.1)
Sodium: 138 mmol/L (ref 135–145)

## 2015-10-30 ENCOUNTER — Encounter: Payer: Self-pay | Admitting: Internal Medicine

## 2015-10-30 NOTE — Progress Notes (Signed)
Opened in error

## 2015-11-10 ENCOUNTER — Encounter: Payer: Self-pay | Admitting: Internal Medicine

## 2015-11-10 ENCOUNTER — Non-Acute Institutional Stay (SKILLED_NURSING_FACILITY): Payer: PPO | Admitting: Internal Medicine

## 2015-11-10 DIAGNOSIS — J42 Unspecified chronic bronchitis: Secondary | ICD-10-CM | POA: Diagnosis not present

## 2015-11-10 DIAGNOSIS — R0609 Other forms of dyspnea: Secondary | ICD-10-CM | POA: Diagnosis not present

## 2015-11-10 NOTE — Patient Instructions (Signed)
Nasal O2 @ 1-2 L/m if oxygen saturations are less than 90% with exertion. If exertional dyspnea progresses; chest x-ray and CBC and differential are indicated.

## 2015-11-10 NOTE — Progress Notes (Signed)
This is a nursing facility follow up for specific acute issue of exertional dyspnea  Interim medical record and care since last [facility] visit was updated with review of diagnostic studies and change in clinical status since last visit were documented.  Note: only family and social history pertinent to this assessment are included. According to his wife his father had emphysema. He quit smoking in 1993.  HPI: His nurse has noted some desaturation to 90% when he is mobilizing in the wheelchair. At rest his O2 sat was 97%. His wife is most attentive and observant. She states he's had shortness of breath for over a year & was taking "breathing treatments" 3-4 times a day at home prior to [facility]. He does have a history of COPD; past history of bilateral pulmonary thromboemboli and right lower extremity deep venous thrombosis. He is on Eliquis at this time. His last chest x-ray was 05/09/15; this revealed chronic bronchitis. His last hemoglobin and hematocrit were 12.7 and 39 on  09/24/15. This is stable to slightly improved.  Comprehensive review of systems: His wife stated that he did have snoring prior to admission. This is not an active issue. There has been no definite sleep apnea. He has a history of urinary frequency and has been seen by [facility]. His wife believes he has a diagnosis of BPH.   Constitutional: No fever,significant weight change, fatigue  Eyes: No redness, discharge, pain, vision change ENT/mouth: No nasal congestion,  purulent discharge, earache,change in hearing ,sore throat  Cardiovascular: No chest pain, palpitations,paroxysmal nocturnal dyspnea, claudication, edema  Respiratory: No cough, sputum production,hemoptysis, Gastrointestinal: No heartburn,dysphagia,abdominal pain, nausea / vomiting,rectal bleeding, melena,change in bowels Genitourinary: No dysuria,hematuria, pyuria,  incontinence, nocturia Endocrine: No change in hair/skin/ nails,  excessive thirst, excessive hunger  Hematologic/lymphatic: No significant bruising, lymphadenopathy,abnormal bleeding Allergy/immunology: No itchy/ watery eyes, significant sneezing, urticaria, angioedema  Physical exam:  Pertinent or positive findings: pattern alopecia is present. He has an upper plate. He is not wearing the lower plate. Loud carotid bruits are present. Grade 1. 5-2 systolic murmur is present. Breath sounds are generally decreased throughout the chest. Pedal pulses are decreased. He has linear pitting at the sock line. Clubbing of the nailbeds is suggested.  General appearance:Adequately nourished; no acute distress , increased work of breathing is present.   Lymphatic: No lymphadenopathy about the head, neck, axilla . Eyes: No conjunctival inflammation or lid edema is present. There is no scleral icterus. Ears:  External ear exam shows no significant lesions or deformities.   Nose:  External nasal examination shows no deformity or inflammation. Nasal mucosa are pink and moist without lesions ,exudates Oral exam: lips and gums are healthy appearing.There is no oropharyngeal erythema or exudate . Neck:  No thyromegaly, masses, tenderness noted.    Lungs:Chest clear to auscultation without wheezes, rhonchi,rales , rubs. Abdomen:Bowel sounds are normal. Abdomen is soft and nontender with no organomegaly, hernias,masses. GU: deferred as previously addressed. Extremities:  No cyanosis  Neurologic exam : He was able to give me his anniversary month and day but not year. Strength equal  in upper & lower extremities Balance,Rhomberg,finger to nose testing could not be completed due to clinical state Skin: Warm & dry w/o tenting. No significant lesions or rash.    See summary under each active problem in the Problem List with associated updated therapeutic plan

## 2015-11-10 NOTE — Assessment & Plan Note (Signed)
O2 sat 1-2 liters per minute will be prescribed if O2 sats are less than 90 % with exertion If symptoms progress;Chest x-ray and CBC will be performed. His wife has requested these be initially deferred due to financial strain related to medical bills. This is appropriate in view of absence of respiratory compromise on exam at this time with O2 sats of 97% on room air. CBC would be performed as he is on the novel oral anticoagulant. There is no suggestion of any bleeding dyscrasias at this time however

## 2015-11-10 NOTE — Assessment & Plan Note (Signed)
Continue pulmonary toilet Chest x-ray if symptoms progressive

## 2015-11-12 ENCOUNTER — Encounter: Payer: Self-pay | Admitting: Internal Medicine

## 2015-11-12 NOTE — Progress Notes (Signed)
This encounter was created in error - please disregard.  This encounter was created in error - please disregard.

## 2015-11-14 DIAGNOSIS — M6281 Muscle weakness (generalized): Secondary | ICD-10-CM | POA: Diagnosis not present

## 2015-11-14 DIAGNOSIS — F0390 Unspecified dementia without behavioral disturbance: Secondary | ICD-10-CM | POA: Diagnosis not present

## 2015-11-14 DIAGNOSIS — R1312 Dysphagia, oropharyngeal phase: Secondary | ICD-10-CM | POA: Diagnosis not present

## 2015-11-14 DIAGNOSIS — I639 Cerebral infarction, unspecified: Secondary | ICD-10-CM | POA: Diagnosis not present

## 2015-11-24 DIAGNOSIS — F0390 Unspecified dementia without behavioral disturbance: Secondary | ICD-10-CM | POA: Diagnosis not present

## 2015-11-24 DIAGNOSIS — R1312 Dysphagia, oropharyngeal phase: Secondary | ICD-10-CM | POA: Diagnosis not present

## 2015-11-24 DIAGNOSIS — M6281 Muscle weakness (generalized): Secondary | ICD-10-CM | POA: Diagnosis not present

## 2015-11-24 DIAGNOSIS — I639 Cerebral infarction, unspecified: Secondary | ICD-10-CM | POA: Diagnosis not present

## 2015-12-01 DIAGNOSIS — M79671 Pain in right foot: Secondary | ICD-10-CM | POA: Diagnosis not present

## 2015-12-01 DIAGNOSIS — E119 Type 2 diabetes mellitus without complications: Secondary | ICD-10-CM | POA: Diagnosis not present

## 2015-12-01 DIAGNOSIS — B351 Tinea unguium: Secondary | ICD-10-CM | POA: Diagnosis not present

## 2015-12-01 DIAGNOSIS — M79672 Pain in left foot: Secondary | ICD-10-CM | POA: Diagnosis not present

## 2015-12-24 LAB — BASIC METABOLIC PANEL
GLUCOSE: 162 mg/dL
Glucose: 200 mg/dL

## 2015-12-25 ENCOUNTER — Non-Acute Institutional Stay (SKILLED_NURSING_FACILITY): Payer: PPO | Admitting: Internal Medicine

## 2015-12-25 DIAGNOSIS — I2584 Coronary atherosclerosis due to calcified coronary lesion: Secondary | ICD-10-CM | POA: Diagnosis not present

## 2015-12-25 DIAGNOSIS — I2510 Atherosclerotic heart disease of native coronary artery without angina pectoris: Secondary | ICD-10-CM | POA: Diagnosis not present

## 2015-12-25 DIAGNOSIS — J42 Unspecified chronic bronchitis: Secondary | ICD-10-CM

## 2015-12-25 DIAGNOSIS — E1159 Type 2 diabetes mellitus with other circulatory complications: Secondary | ICD-10-CM

## 2015-12-25 DIAGNOSIS — I2699 Other pulmonary embolism without acute cor pulmonale: Secondary | ICD-10-CM

## 2015-12-25 DIAGNOSIS — I10 Essential (primary) hypertension: Secondary | ICD-10-CM

## 2015-12-25 DIAGNOSIS — I350 Nonrheumatic aortic (valve) stenosis: Secondary | ICD-10-CM

## 2015-12-25 LAB — BASIC METABOLIC PANEL: Glucose: 182 mg/dL

## 2015-12-25 NOTE — Progress Notes (Signed)
Location:   [facility] Room Number: 139 Place of Service:  SNF (31) Provider:  Dr. Chales Abrahams   Patient Care Team: Pecola Lawless, MD as PCP - General (Internal Medicine) Roma Kayser, MD (Endocrinology) Laqueta Linden, MD as Attending Physician (Cardiology)  Extended Emergency Contact Information Primary Emergency Contact: Biagini,Doris J Address: [address]          [place], [place] [address] [place] Home Phone: [phone] Relation: Spouse Secondary Emergency Contact: Wahlert,Jason Address: [address]           [place], [place] [address] [place] of [place] Home Phone: [phone] Mobile Phone: [phone] Relation: Son  Code Status:  DNR Goals of care: Advanced Directive information Advanced Directives 12/25/2015  Does patient have an advance directive? Yes  Type of Advance Directive Out of facility DNR (pink MOST or yellow form)  Does patient want to make changes to advanced directive? No - Patient declined  Copy of advanced directive(s) in chart? Yes  Would patient like information on creating an advanced directive? -  Pre-existing out of facility DNR order (yellow form or pink MOST form) -     Chief Complaint  Patient presents with  . Medical Management of Chronic Issues    Medical Management of Chronic Issues    HPI:  Pt is a 82 y.o. male seen today for medical management of chronic diseases.  He is a very pleasant white male. Most of the history was obtained by his wife. Patient is doing well. He still does get SOB on exertion. He was also saying he has hard time sleeping at night. Other wise he had no C/o SOB , Cough and chest pain. His appetite is good and he is has no constipation or diarrhea.   Past Medical History:  Diagnosis Date  . Carotid artery occlusion    a. 05/2012 U/S Bilat ICA 20-39%.  . DM (diabetes mellitus) (HCC)   . Hiatal hernia   . HTN (hypertension)   . Macular degeneration   . Nephrolithiasis   .  Obesity   . Stroke [facility])    a. 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 01/2009 TEE: EF 60-65%, mild MR, no LA/LAA thrombus.   Past Surgical History:  Procedure Laterality Date  . CATARACT EXTRACTION W/PHACO Right 10/02/2012   Procedure: CATARACT EXTRACTION PHACO AND INTRAOCULAR LENS PLACEMENT (IOC);  Surgeon: Gemma Payor, MD;  Location: [facility] ORS;  Service: Ophthalmology;  Laterality: Right;  CDE 12.78  . CATARACT EXTRACTION W/PHACO Left 10/30/2012   Procedure: CATARACT EXTRACTION PHACO AND INTRAOCULAR LENS PLACEMENT (IOC);  Surgeon: Gemma Payor, MD;  Location: [facility] ORS;  Service: Ophthalmology;  Laterality: Left;  CDE: 19.10  . EYE SURGERY    . FINGER SURGERY Left    pinky, after saw cut it off  . LEFT HEART CATHETERIZATION WITH CORONARY ANGIOGRAM Bilateral 03/08/2013   Procedure: LEFT HEART CATHETERIZATION WITH CORONARY ANGIOGRAM;  Surgeon: Laurey Morale, MD;  Location: [facility] CATH LAB;  Service: Cardiovascular;  Laterality: Bilateral;  . TEE WITHOUT CARDIOVERSION N/A 12/16/2014   Procedure: TRANSESOPHAGEAL ECHOCARDIOGRAM (TEE);  Surgeon: Antoine Poche, MD;  Location: [facility] ENDO SUITE;  Service: Cardiology;  Laterality: N/A;  will need 40 minutes to clean scope between procedures    Allergies  Allergen Reactions  . Aspirin Hives and Rash      Medication List       Accurate as of 12/25/15  2:59 PM. Always use your most recent med list.  albuterol (2.5 MG/3ML) 0.083% nebulizer solution Commonly known as:  PROVENTIL 1 inhalation four times a day 8 am, 12  Pm, 4 pm, 9 pm for wheezing and SOB   ALOE VESTA PROTECTIVE Oint Apply to sacrum and bilateral buttock three times daily   atorvastatin 10 MG tablet Commonly known as:  LIPITOR Take 10 mg by mouth daily.   B-12 1000 MCG Tbcr Take 1 tablet by mouth 2 (two) times daily.   carvedilol 3.125 MG tablet Commonly known as:  COREG TAKE 1 TABLET BY MOUTH TWICE DAILY WITH MEALS.   donepezil 10 MG tablet Commonly known as:   ARICEPT Take 10 mg by mouth daily after supper.   ELIQUIS 5 MG Tabs tablet Generic drug:  apixaban Take 5 mg by mouth 2 (two) times daily.   ENSURE Take one by mouth once daily   FLOMAX 0.4 MG Caps capsule Generic drug:  tamsulosin Take 0.4 mg by mouth at bedtime.   furosemide 20 MG tablet Commonly known as:  LASIX Take 20 mg by mouth daily.   lisinopril 5 MG tablet Commonly known as:  PRINIVIL,ZESTRIL Take 1 tablet (5 mg total) by mouth daily.   LORazepam 0.5 MG tablet Commonly known as:  ATIVAN Take 0.25 mg by mouth daily.   LORazepam 0.5 MG tablet Commonly known as:  ATIVAN Take 0.25 mg by mouth at bedtime.   metFORMIN 500 MG tablet Commonly known as:  GLUCOPHAGE TAKE (1) TABLET BY MOUTH TWICE DAILY.   multivitamin with minerals Tabs tablet Take 1 tablet by mouth daily.   nitroGLYCERIN 0.4 MG SL tablet Commonly known as:  NITROSTAT Place 1 tablet (0.4 mg total) under the tongue every 5 (five) minutes as needed for chest pain.   OXYGEN 2L/ mn via N/C prn to maintain stas >90 % Every shift   traMADol 50 MG tablet Commonly known as:  ULTRAM Take 1 tablet (50 mg total) by mouth 3 (three) times daily as needed for moderate pain. For pain   vitamin C 1000 MG tablet Take 1,500 mg by mouth daily.   Vitamin D 2000 units Caps Take 1 capsule by mouth daily after supper.       Review of Systems  Constitutional: Negative for appetite change, chills and fatigue.  HENT: Negative.   Respiratory: Positive for cough and shortness of breath. Negative for chest tightness and wheezing.   Cardiovascular: Negative for chest pain and leg swelling.  Gastrointestinal: Negative for abdominal distention, abdominal pain, constipation and diarrhea.  Neurological: Negative.   Psychiatric/Behavioral: Negative.     Functional Status Survey:    Vitals:   12/25/15 1439  BP: 117/63  Pulse: 87  Resp: (!) 22  Temp: 97.2 F (36.2 C)  TempSrc: Oral  Weight: 192 lb (87.1 kg)   Height: 5\' 10"  (1.778 m)   Body mass index is 27.55 kg/m. Physical Exam  Constitutional: He appears well-developed.  HENT:  Head: Normocephalic.  Neck: Neck supple.  Cardiovascular: Normal rate.   Murmur heard. Pulmonary/Chest: Breath sounds normal. No respiratory distress. He has no wheezes.  Abdominal: Soft. Bowel sounds are normal.  Musculoskeletal: He exhibits no edema.  Patient was wearing Compression socks.  Neurological: He is alert.    Labs reviewed:  Recent Labs  09/26/15 1700 10/01/15 1100 10/29/15 0630  NA 137 136 138  K 4.6 4.5 4.2  CL 99* 98* 101  CO2 31 27 32  GLUCOSE 180* 207* 149*  BUN 24* 22* 17  CREATININE 0.98 0.98 0.84  CALCIUM 9.0 9.0 8.8*    Recent Labs  05/07/15 0740 08/06/15 0745 09/24/15 0720  AST 25 17 16   ALT 16* 13* 14*  ALKPHOS 46 34* 43  BILITOT 0.8 0.5 0.7  PROT 6.4* 5.5* 6.1*  ALBUMIN 3.5 2.9* 3.3*    Recent Labs  05/07/15 0740 08/06/15 0745 09/24/15 0720  WBC 7.4 7.7 7.0  NEUTROABS 5.2 5.4 4.8  HGB 13.1 12.0* 12.7*  HCT 41.1 37.2* 39.8  MCV 88.0 86.9 86.3  PLT 216 176 195   Lab Results  Component Value Date   TSH 1.803 03/07/2013   Lab Results  Component Value Date   HGBA1C 6.2 (H) 09/24/2015   Lab Results  Component Value Date   CHOL 68 08/06/2015   HDL 27 (L) 08/06/2015   LDLCALC 10 08/06/2015   TRIG 154 (H) 08/06/2015   CHOLHDL 2.5 08/06/2015    Significant Diagnostic Results in last 30 days:  No results found.  Assessment/Plan  1. COPD Patient is doing well. Would continue nebulizer. Patient would not be able to use inhaler. 2.CAD Stable Continue Same treatment. LDL very low. Cont same low dose of lipitor. 3.Dementia Aricept is also helping with behavior. Cont same treatment 4.History of PE and DVT. Will continue Eliquis. 5 Essential hypertension Doing well. No change right now. 6 Type 2 Diabetes. Last HBA1C was 6.2.  Continue Metformin.

## 2016-01-06 ENCOUNTER — Encounter (HOSPITAL_COMMUNITY)
Admission: RE | Admit: 2016-01-06 | Discharge: 2016-01-06 | Disposition: A | Discharge status: Home / Self Care | Payer: PPO | Source: Skilled Nursing Facility | Attending: Internal Medicine | Admitting: Internal Medicine

## 2016-01-06 DIAGNOSIS — M6281 Muscle weakness (generalized): Secondary | ICD-10-CM | POA: Diagnosis not present

## 2016-01-06 DIAGNOSIS — Z7901 Long term (current) use of anticoagulants: Secondary | ICD-10-CM | POA: Diagnosis present

## 2016-01-06 DIAGNOSIS — R262 Difficulty in walking, not elsewhere classified: Secondary | ICD-10-CM | POA: Diagnosis not present

## 2016-01-06 DIAGNOSIS — F0390 Unspecified dementia without behavioral disturbance: Secondary | ICD-10-CM | POA: Diagnosis not present

## 2016-01-06 DIAGNOSIS — R278 Other lack of coordination: Secondary | ICD-10-CM | POA: Diagnosis present

## 2016-01-07 ENCOUNTER — Non-Acute Institutional Stay (SKILLED_NURSING_FACILITY): Payer: PPO | Admitting: Internal Medicine

## 2016-01-07 ENCOUNTER — Encounter: Payer: Self-pay | Admitting: Internal Medicine

## 2016-01-07 DIAGNOSIS — E118 Type 2 diabetes mellitus with unspecified complications: Secondary | ICD-10-CM | POA: Diagnosis not present

## 2016-01-07 LAB — HEMOGLOBIN A1C
HEMOGLOBIN A1C: 8 % — AB (ref 4.8–5.6)
MEAN PLASMA GLUCOSE: 183 mg/dL

## 2016-01-07 NOTE — Progress Notes (Signed)
This is an acute visit.  Level care skilled.  [facility]  Chief complaint-acute visit follow-up elevated hemoglobin A1c.       HPI:   Patient is a pleasant 82 year old male who was seen here for diabetic management.  He does have a history of diabetes type 2 at one point been on Glucophage and Amaryl but Amaryl was discontinued secondary to some low blood sugars.  At one point was on Glucophage 500 mg twice a day but this was decreased to once a day apparently because of diarrhea concerns.  Hemoglobin A1c done yesterday was up at 8.0 it had been 6.23 months ago.  Recent blood sugars in the morning appear to run largely in the mid 100s-she gets them at 9 PM as well and these are somewhat more elevated in the higher 100s lower 200s generally.  He does not report any diarrhea he is sitting in his wheelchair comfortably nursing staff does not report any acute issues       Past Medical History:  Diagnosis Date  . Carotid artery occlusion    a. 05/2012 U/S Bilat ICA 20-39%.  . DM (diabetes mellitus) (HCC)   . Hiatal hernia   . HTN (hypertension)   . Macular degeneration   . Nephrolithiasis   . Obesity   . Stroke [facility](HCC)    a. 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 01/2009 TEE: EF 60-65%, mild MR, no LA/LAA thrombus.        Past Surgical History:  Procedure Laterality Date  . CATARACT EXTRACTION W/PHACO Right 10/02/2012   Procedure: CATARACT EXTRACTION PHACO AND INTRAOCULAR LENS PLACEMENT (IOC);  Surgeon: Gemma PayorKerry Hunt, MD;  Location: [facility]S;  Service: Ophthalmology;  Laterality: Right;  CDE 12.78  . CATARACT EXTRACTION W/PHACO Left 10/30/2012   Procedure: CATARACT EXTRACTION PHACO AND INTRAOCULAR LENS PLACEMENT (IOC);  Surgeon: Gemma PayorKerry Hunt, MD;  Location: [facility] ORS;  Service: Ophthalmology;  Laterality: Left;  CDE: 19.10  . EYE SURGERY    . FINGER SURGERY Left    pinky, after saw cut it off  . LEFT HEART CATHETERIZATION WITH CORONARY ANGIOGRAM  Bilateral 03/08/2013   Procedure: LEFT HEART CATHETERIZATION WITH CORONARY ANGIOGRAM;  Surgeon: Laurey Moralealton S McLean, MD;  [facility];  Service: Cardiovascular;  Laterality: Bilateral;  . TEE WITHOUT CARDIOVERSION N/A 12/16/2014   Procedure: TRANSESOPHAGEAL ECHOCARDIOGRAM (TEE);  Surgeon: Antoine PocheJonathan F Branch, MD;  Location: [facility] ENDO SUITE;  Service: Cardiology;  Laterality: N/A;  will need 40 minutes to clean scope between procedures        Allergies  Allergen Reactions  . Aspirin Hives and Rash          Medication List           Accurate as of 12/25/15  2:59 PM. Always use your most recent med list.           albuterol (2.5 MG/3ML) 0.083% nebulizer solution Commonly known as:  PROVENTIL 1 inhalation four times a day 8 am, 12  Pm, 4 pm, 9 pm for wheezing and SOB  ALOE VESTA PROTECTIVE Oint Apply to sacrum and bilateral buttock three times daily  atorvastatin 10 MG tablet Commonly known as:  LIPITOR Take 10 mg by mouth daily.  B-12 1000 MCG Tbcr Take 1 tablet by mouth 2 (two) times daily.  carvedilol 3.125 MG tablet Commonly known as:  COREG TAKE 1 TABLET BY MOUTH TWICE DAILY WITH MEALS.  donepezil 10 MG tablet Commonly known as:  ARICEPT Take 10 mg by  mouth daily after supper.  ELIQUIS 5 MG Tabs tablet Generic drug:  apixaban Take 5 mg by mouth 2 (two) times daily.  ENSURE Take one by mouth once daily  FLOMAX 0.4 MG Caps capsule Generic drug:  tamsulosin Take 0.4 mg by mouth at bedtime.  furosemide 20 MG tablet Commonly known as:  LASIX Take 20 mg by mouth daily.  lisinopril 5 MG tablet Commonly known as:  PRINIVIL,ZESTRIL Take 1 tablet (5 mg total) by mouth daily.  LORazepam 0.5 MG tablet Commonly known as:  ATIVAN Take 0.25 mg by mouth daily.  LORazepam 0.5 MG tablet Commonly known as:  ATIVAN Take 0.25 mg by mouth at bedtime.  metFORMIN 500 MG tablet Commonly known as:  GLUCOPHAGE TAKE (1) TABLET BY MOUTH TWICE DAILY.  multivitamin with  minerals Tabs tablet Take 1 tablet by mouth daily.  nitroGLYCERIN 0.4 MG SL tablet Commonly known as:  NITROSTAT Place 1 tablet (0.4 mg total) under the tongue every 5 (five) minutes as needed for chest pain.  OXYGEN 2L/ mn via N/C prn to maintain stas >90 % Every shift  traMADol 50 MG tablet Commonly known as:  ULTRAM Take 1 tablet (50 mg total) by mouth 3 (three) times daily as needed for moderate pain. For pain  vitamin C 1000 MG tablet Take 1,500 mg by mouth daily.  Vitamin D 2000 units Caps Take 1 capsule by mouth daily after supper.      Review of Systems  Constitutional: Negative for appetite change, chills and fatigue.  HENT: Negative.   Respiratory: Negative for chest tightness and wheezing At times does have a cough.   Cardiovascular: Negative for chest pain  chronic  mild  leg swelling.  Gastrointestinal: Negative for abdominal distention, abdominal pain, constipation and diarrhea.  Neurological: Negative.   Psychiatric/Behavioral: Negative.  No history of behaviors doing well with supportive care   Functional Status Survey:     Body mass index is 27.55 kg/m. Physical Exam   He is afebrile pulse 80 respirations 18 blood pressure ranging 146/80-129/82-appears to have some variability  Constitutional: He appears well-developed sitting comfortably in his wheelchair.  HENT:  Head: Normocephalic.  Neck: Neck supple.  Cardiovascular: Normal rate.   Murmur heard.Has trace-1+ lower extremity edema compression hose are on  Pulmonary/Chest: Breath sounds normal. No respiratory distress. He has no wheezes.  Abdominal: Soft. Bowel sounds are normal.  vitamin is obese  Musculoskeletal: He exhibitsmild baseline  edema.  Patient was wearing Compression socks.  Neurological: He is alert. Oriented to self does follow simple verbal commands without difficulty   Labs reviewed: 01/06/2016.  Hemoglobin A1c 8.0.  10/29/2015.  Sodium 138 potassium 4.2 BUN 17  creatinine 0.84.     Recent Labs (within last 365 days)   Recent Labs  09/26/15 1700 10/01/15 1100 10/29/15 0630  NA 137 136 138  K 4.6 4.5 4.2  CL 99* 98* 101  CO2 31 27 32  GLUCOSE 180* 207* 149*  BUN 24* 22* 17  CREATININE 0.98 0.98 0.84  CALCIUM 9.0 9.0 8.8*      Recent Labs (within last 365 days)   Recent Labs  05/07/15 0740 08/06/15 0745 09/24/15 0720  AST 25 17 16   ALT 16* 13* 14*  ALKPHOS 46 34* 43  BILITOT 0.8 0.5 0.7  PROT 6.4* 5.5* 6.1*  ALBUMIN 3.5 2.9* 3.3*      Recent Labs (within last 365 days)   Recent Labs  05/07/15 0740 08/06/15 0745 09/24/15 0720  WBC 7.4  7.7 7.0  NEUTROABS 5.2 5.4 4.8  HGB 13.1 12.0* 12.7*  HCT 41.1 37.2* 39.8  MCV 88.0 86.9 86.3  PLT 216 176 195     Recent Labs       Lab Results  Component Value Date   TSH 1.803 03/07/2013     Recent Labs       Lab Results  Component Value Date   HGBA1C 6.2 (H) 09/24/2015     Recent Labs       Lab Results  Component Value Date   CHOL 68 08/06/2015   HDL 27 (L) 08/06/2015   LDLCALC 10 08/06/2015   TRIG 154 (H) 08/06/2015   CHOLHDL 2.5 08/06/2015      Significant Diagnostic Results in last 30 days:  Imaging Results  No results found.   Assessment and plan.  #1 diabetes 2-her hemoglobin A1c has gone up here up to 8.0-it appears blood sugars in the morning are fairly well controlled with elevations later in the day would like to get at least temporarily some increased readings at before meals and at bedtime to see where the spikes are occurring-we will cautiously increase  Glucophage to 500 mg in the morning and 250 mg every afternoon-monitor for any diarrhea and again will await blood sugar results before making any additional changes.  If diarrhea occurs c will have to cut back on the Glucophage I suspect.  [id number] of note greater than 25 minutes spent assessing patient-discussing his status with nursing staff-reviewing his chart-his  labs-blood sugars-and coordinating plan of care

## 2016-01-21 ENCOUNTER — Non-Acute Institutional Stay (SKILLED_NURSING_FACILITY): Payer: PPO | Admitting: Internal Medicine

## 2016-01-21 ENCOUNTER — Encounter: Payer: Self-pay | Admitting: Internal Medicine

## 2016-01-21 DIAGNOSIS — J42 Unspecified chronic bronchitis: Secondary | ICD-10-CM

## 2016-01-21 DIAGNOSIS — F0390 Unspecified dementia without behavioral disturbance: Secondary | ICD-10-CM

## 2016-01-21 DIAGNOSIS — I2584 Coronary atherosclerosis due to calcified coronary lesion: Secondary | ICD-10-CM | POA: Diagnosis not present

## 2016-01-21 DIAGNOSIS — I2510 Atherosclerotic heart disease of native coronary artery without angina pectoris: Secondary | ICD-10-CM

## 2016-01-21 DIAGNOSIS — I2699 Other pulmonary embolism without acute cor pulmonale: Secondary | ICD-10-CM

## 2016-01-21 DIAGNOSIS — E1159 Type 2 diabetes mellitus with other circulatory complications: Secondary | ICD-10-CM | POA: Diagnosis not present

## 2016-01-21 NOTE — Progress Notes (Signed)
Location:   [facility] Room Number: 139/D Place of Service:  SNF [phone]) Provider:  Abigail Miyamoto, MD  Patient Care Team: Pecola Lawless, MD as PCP - General (Internal Medicine) Roma Kayser, MD (Endocrinology) Laqueta Linden, MD as Attending Physician (Cardiology)  Extended Emergency Contact Information Primary Emergency Contact: Skarda,Doris J Address: [address]          [place], [place] [address] D[place] of [place] Home Phone: [phone] Relation: Spouse Secondary Emergency Contact: Montano,Jason Address: [address]           [place], [place] [address] [place] of [place] Home Phone: [phone] Mobile Phone: [phone] Relation: Son  Code Status:  DNR Goals of care: Advanced Directive information Advanced Directives 01/21/2016  Does patient have an advance directive? Yes  Type of Advance Directive Out of facility DNR (pink MOST or yellow form)  Does patient want to make changes to advanced directive? No - Patient declined  Copy of advanced directive(s) in chart? Yes  Would patient like information on creating an advanced directive? -  Pre-existing out of facility DNR order (yellow form or pink MOST form) -     Chief Complaint  Patient presents with  . Medical Management of Chronic Issues    Routine Visit   Her medical management of chronic issues including diabetes type 2-history CVA-chronic lower extremity edema-COPD-dementia-history of pulmonary embolism DVT.   HPI:  Pt is a 82 y.o. male seen today for medical management of chronic diseases as noted above.  . He appears to be doing well-1. his Glucophage was decreased secondary to concerns of diarrhea-however we have been gradually titrating this back up secondary to elevated blood sugars he is currently on 500 mg every morning 250 mg every afternoon.  His blood sugars in the morning appear to be largely in the mid 100s however later in the day appear to rise  with Sugars in the high 100s to mid 200s generally.  This appears to be the case at 4:30 as well as at bedtime as well.  His hemoglobin A1c did show an elevation at 8.0 most recently previously was 6.2  Has have a history of COPD which appears to be well controlled currently on routine nebulizers 4 times a day.  He also has dementia which appears to be stable with supportive care he is on Aricept.  He does have a previous history of pulmonary embolism and DVT and is on  Eliquist  appears to have tolerated this well.  He also has a history hypertension continues on Coreg and lisinopril this appears stable I got a blood pressure 126/70 manually today I see previous listed once 129/62-108/68-146/80.  He does have a history of BPH he is on Flomax and this has not really been an issue recently as well.  He has some chronic lower extremity edema which appears to be stable-he does have a history of grade 1 diastolic dysfunction per echo done in July 2016.  Continues on Lasix 20 mg a day    Regards coronary artery disease again he continues on anticoagulation as noted above he is also on a statin    Currently he is lying in bed comfortably his wife is in the room as well she is extremely supportive and I suspect one of the reasons he has done so well here.     Past Medical History:  Diagnosis Date  . Carotid artery occlusion    a. 05/2012 U/S Bilat  ICA 20-39%.  . DM (diabetes mellitus) (HCC)   . Hiatal hernia   . HTN (hypertension)   . Macular degeneration   . Nephrolithiasis   . Obesity   . Stroke [facility](HCC)    a. 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 01/2009 TEE: EF 60-65%, mild MR, no LA/LAA thrombus.   Past Surgical History:  Procedure Laterality Date  . CATARACT EXTRACTION W/PHACO Right 10/02/2012   Procedure: CATARACT EXTRACTION PHACO AND INTRAOCULAR LENS PLACEMENT (IOC);  Surgeon: Gemma PayorKerry Hunt, MD;  Location: [facility];  Service: Ophthalmology;  Laterality: Right;  CDE 12.78  .  CATARACT EXTRACTION W/PHACO Left 10/30/2012   Procedure: CATARACT EXTRACTION PHACO AND INTRAOCULAR LENS PLACEMENT (IOC);  Surgeon: Gemma PayorKerry Hunt, MD;  Location: [facility]S;  Service: Ophthalmology;  Laterality: Left;  CDE: 19.10  . EYE SURGERY    . FINGER SURGERY Left    pinky, after saw cut it off  . LEFT HEART CATHETERIZATION WITH CORONARY ANGIOGRAM Bilateral 03/08/2013   Procedure: LEFT HEART CATHETERIZATION WITH CORONARY ANGIOGRAM;  Surgeon: Laurey Moralealton S McLean, MD;  Location: [facility] CATH LAB;  Service: Cardiovascular;  Laterality: Bilateral;  . TEE WITHOUT CARDIOVERSION N/A 12/16/2014   Procedure: TRANSESOPHAGEAL ECHOCARDIOGRAM (TEE);  Surgeon: Antoine PocheJonathan F Branch, MD;  Location: [facility] ENDO SUITE;  Service: Cardiology;  Laterality: N/A;  will need 40 minutes to clean scope between procedures    Allergies  Allergen Reactions  . Aspirin Hives and Rash      Medication List       Accurate as of 01/21/16  3:42 PM. Always use your most recent med list.          albuterol (2.5 MG/3ML) 0.083% nebulizer solution Commonly known as:  PROVENTIL 1 inhalation four times a day 8 am, 12  Pm, 4 pm, 9 pm for wheezing and SOB   ALOE VESTA PROTECTIVE Oint Apply to sacrum and bilateral buttock three times daily   atorvastatin 10 MG tablet Commonly known as:  LIPITOR Take 10 mg by mouth daily.   B-12 1000 MCG Tbcr Take 1 tablet by mouth 2 (two) times daily.   carvedilol 3.125 MG tablet Commonly known as:  COREG TAKE 1 TABLET BY MOUTH TWICE DAILY WITH MEALS.   donepezil 10 MG tablet Commonly known as:  ARICEPT Take 10 mg by mouth daily after supper.   ELIQUIS 5 MG Tabs tablet Generic drug:  apixaban Take 5 mg by mouth 2 (two) times daily.   ENSURE Take one by mouth once daily   FLOMAX 0.4 MG Caps capsule Generic drug:  tamsulosin Take 0.4 mg by mouth at bedtime.   furosemide 20 MG tablet Commonly known as:  LASIX Take 20 mg by mouth daily.   lisinopril 5 MG tablet Commonly known as:   PRINIVIL,ZESTRIL Take 1 tablet (5 mg total) by mouth daily.   LORazepam 0.5 MG tablet Commonly known as:  ATIVAN Take 0.25 mg by mouth daily.   LORazepam 0.5 MG tablet Commonly known as:  ATIVAN Take 0.25 mg by mouth at bedtime.   metFORMIN 500 MG tablet Commonly known as:  GLUCOPHAGE Take 250 mg by mouth every evening.   metFORMIN 500 MG tablet Commonly known as:  GLUCOPHAGE Take 500 mg by mouth daily.   multivitamin with minerals Tabs tablet Take 1 tablet by mouth daily.   nitroGLYCERIN 0.4 MG SL tablet Commonly known as:  NITROSTAT Place 1 tablet (0.4 mg total) under the tongue every 5 (five) minutes as needed for chest pain.   OXYGEN  2L/ mn via N/C prn to maintain stas >90 % Every shift   traMADol 50 MG tablet Commonly known as:  ULTRAM Take 1 tablet (50 mg total) by mouth 3 (three) times daily as needed for moderate pain. For pain   vitamin C 1000 MG tablet Take 1,500 mg by mouth daily.   Vitamin D 2000 units Caps Take 1 capsule by mouth daily after supper.       Review of Systems   This is limited secondary to patient's dementia provided by patient and nursing staff.  General no complaints of fever or chills weight appears to be stable.  Skin is not complaining of any rashes or itching.  Head ears eyes nose mouth and throat does not complain of any sore throat or visual changes.  Respiratory does have a history of COPD but has been stable here does not complain of increased cough or shortness of breath.  Cardiac does not complain of chest pain has chronic lower extremity edema which appears to be stable.  GI is not complaining of any abdominal discomfort nausea vomiting diarrhea or constipation.  GU Not complaining of dysuria today.  Muscle skeletal ambulates in a wheelchair he is not complaining of joint pain currently.  Neurologic does not complain of dizziness headache or syncopal-type feelings.  Psych does have his dementia but  appears to be doing well with supportive care no behaviors have been noted he is  pleasantly confused at times.   There is no immunization history on file for this patient. Pertinent  Health Maintenance Due  Topic Date Due  . INFLUENZA VACCINE  04/23/2016 (Originally 12/23/2015)  . FOOT EXAM  10/27/2016 (Originally 02/05/1938)  . OPHTHALMOLOGY EXAM  10/27/2016 (Originally 02/05/1938)  . PNA vac Low Risk Adult (1 of 2 - PCV13) 10/27/2016 (Originally 02[birth year]05/1995)  . HEMOGLOBIN A1C  07/08/2016   No flowsheet data found. Functional Status Survey:    Vitals:   01/21/16 1527  BP: (!) 150/78  Pulse: 70  Resp: 20  Temp: 97.7 F (36.5 C)  TempSrc: Oral  SpO2: 95%  No blood pressure taken manually today was 126/70 pulse was 56 There is no height or weight on file to calculate BMI. Physical Exam   In general this is a pleasant elderly male in no distress resting comfortably in bed.  His skin is warm and dry. Eyes pupils appear reactive light sclera and conjunctiva are clear visual acuity is grossly intact.  Chest is clear to auscultation with a prolonged expiratory phase which is not new no labored breathing.  Heart is regular irregular rate and rhythm  he does have a 3/6 systolic murmur which is baseline.  He has chronic lower extremity edema one plus he has compression hose on pedal pulses are intact.  His abdomen is soft nontender with positive bowel sounds.  GU could not really appreciate any suprapubic distention or tenderness cannot appreciate drainage or erythema or rash  Extremities has lower extremity weakness which appears to be baseline upper extremity strength appears to be preserved he ambulates in a wheelchair which is his baseline.  Neurologic is grossly intact speech is clear although he is somewhat confused pleasantly-no lateralizing findings.  Psych he is oriented to self pleasant conversant he seems to always be smiling when I see him in his wheelchair and  waving Labs reviewed:  Recent Labs  09/26/15 1700 10/01/15 1100 10/29/15 0630  NA 137 136 138  K 4.6 4.5 4.2  CL 99* 98* 101  CO2  31 27 32  GLUCOSE 180* 207* 149*  BUN 24* 22* 17  CREATININE 0.98 0.98 0.84  CALCIUM 9.0 9.0 8.8*    Recent Labs  05/07/15 0740 08/06/15 0745 09/24/15 0720  AST 25 17 16   ALT 16* 13* 14*  ALKPHOS 46 34* 43  BILITOT 0.8 0.5 0.7  PROT 6.4* 5.5* 6.1*  ALBUMIN 3.5 2.9* 3.3*    Recent Labs  05/07/15 0740 08/06/15 0745 09/24/15 0720  WBC 7.4 7.7 7.0  NEUTROABS 5.2 5.4 4.8  HGB 13.1 12.0* 12.7*  HCT 41.1 37.2* 39.8  MCV 88.0 86.9 86.3  PLT 216 176 195   Lab Results  Component Value Date   TSH 1.803 03/07/2013   Lab Results  Component Value Date   HGBA1C 8.0 (H) 01/06/2016   Lab Results  Component Value Date   CHOL 68 08/06/2015   HDL 27 (L) 08/06/2015   LDLCALC 10 08/06/2015   TRIG 154 (H) 08/06/2015   CHOLHDL 2.5 08/06/2015    Significant Diagnostic Results in last 30 days:  No results found.  Assessment/Plan  #1 history of diabetes type 2-again hemoglobin A1c appears to have risen fairly significantly will change his Glucophage to 750 mg every morning and continue 2 50   mgevery PM   and monitor for any diarrhea also continue to monitor blood sugars hopefully this will have a beneficial effect- Glucophage was discontinued secondary to some question that this may be giving the patient diarrhea this will have to be watched diarrhea has not recently been an issue  #2 COPD with history of pneumonia this is been stable for a considerable period of time he is on albuterol nebs 4 times a day.  #3 history of grade 1 diastolic CHF- he is  on Lasix and appears to be tolerating this well will update a metabolic panel.  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  #5 history of calcified aortic stenosis-this is thought to be moderate to severe continues with a significant murmur  nonetheless she appears to be asymptomatic he is on anticoagulation.  #6 history of hyperlipidemia he is on Lipitor will update a lipid panel at one point LDL was noted be 10 back in March.  Liver function tests in the past unremarkable but we will have to keep an eye on this.  #7 dementia this appears to be fairly significant he is on Aricept and is doing very well with supportive care.  #8 history of hypertension he is on Coreg as well as lisinopril and this is stable as noted above.  #9-history of pain this appears quite well controlled he does receive tramadol as needed.  #10 history of anxiety continues on Ativan at bedtime and this appears stable as well--nursing does not report any issues apparently is sleeping fairly well  Again will update a CBC for updated values as well as a CMP and fasting lipid panel-and will again change his Glucophage to 750 mg every morning and 250 mg every afternoon.  [id number] note greater than 35 minutes spent assessing patient-discussing his status with nursing staff-reviewing his chart-reviewing his labs including blood sugars-and coordinating and formulating a plan of care for numerous diagnoses-of note greater than 50% of time spent coordinating plan of care

## 2016-02-02 DIAGNOSIS — F0390 Unspecified dementia without behavioral disturbance: Secondary | ICD-10-CM | POA: Diagnosis not present

## 2016-02-02 DIAGNOSIS — F329 Major depressive disorder, single episode, unspecified: Secondary | ICD-10-CM | POA: Diagnosis not present

## 2016-02-02 DIAGNOSIS — F419 Anxiety disorder, unspecified: Secondary | ICD-10-CM | POA: Diagnosis not present

## 2016-02-19 DIAGNOSIS — F0390 Unspecified dementia without behavioral disturbance: Secondary | ICD-10-CM | POA: Diagnosis not present

## 2016-02-19 DIAGNOSIS — F329 Major depressive disorder, single episode, unspecified: Secondary | ICD-10-CM | POA: Diagnosis not present

## 2016-02-19 DIAGNOSIS — F419 Anxiety disorder, unspecified: Secondary | ICD-10-CM | POA: Diagnosis not present

## 2016-02-25 ENCOUNTER — Encounter: Payer: Self-pay | Admitting: Internal Medicine

## 2016-02-25 ENCOUNTER — Emergency Department (HOSPITAL_COMMUNITY)
Admission: EM | Admit: 2016-02-25 | Discharge: 2016-02-25 | Disposition: A | Discharge status: Home / Self Care | Payer: PPO | Attending: Emergency Medicine | Admitting: Emergency Medicine

## 2016-02-25 ENCOUNTER — Non-Acute Institutional Stay: Payer: PPO | Admitting: Internal Medicine

## 2016-02-25 ENCOUNTER — Encounter (HOSPITAL_COMMUNITY): Payer: Self-pay | Admitting: Emergency Medicine

## 2016-02-25 DIAGNOSIS — T148XXA Other injury of unspecified body region, initial encounter: Secondary | ICD-10-CM

## 2016-02-25 DIAGNOSIS — Z7984 Long term (current) use of oral hypoglycemic drugs: Secondary | ICD-10-CM | POA: Insufficient documentation

## 2016-02-25 DIAGNOSIS — S3992XA Unspecified injury of lower back, initial encounter: Secondary | ICD-10-CM | POA: Diagnosis not present

## 2016-02-25 DIAGNOSIS — M545 Low back pain: Secondary | ICD-10-CM

## 2016-02-25 DIAGNOSIS — Y929 Unspecified place or not applicable: Secondary | ICD-10-CM | POA: Insufficient documentation

## 2016-02-25 DIAGNOSIS — S39012A Strain of muscle, fascia and tendon of lower back, initial encounter: Secondary | ICD-10-CM | POA: Insufficient documentation

## 2016-02-25 DIAGNOSIS — Y999 Unspecified external cause status: Secondary | ICD-10-CM | POA: Insufficient documentation

## 2016-02-25 DIAGNOSIS — Z87891 Personal history of nicotine dependence: Secondary | ICD-10-CM | POA: Insufficient documentation

## 2016-02-25 DIAGNOSIS — Z8673 Personal history of transient ischemic attack (TIA), and cerebral infarction without residual deficits: Secondary | ICD-10-CM | POA: Diagnosis not present

## 2016-02-25 DIAGNOSIS — J449 Chronic obstructive pulmonary disease, unspecified: Secondary | ICD-10-CM | POA: Diagnosis not present

## 2016-02-25 DIAGNOSIS — Z79899 Other long term (current) drug therapy: Secondary | ICD-10-CM | POA: Diagnosis not present

## 2016-02-25 DIAGNOSIS — X58XXXA Exposure to other specified factors, initial encounter: Secondary | ICD-10-CM | POA: Diagnosis not present

## 2016-02-25 DIAGNOSIS — Y939 Activity, unspecified: Secondary | ICD-10-CM | POA: Diagnosis not present

## 2016-02-25 DIAGNOSIS — E119 Type 2 diabetes mellitus without complications: Secondary | ICD-10-CM | POA: Diagnosis not present

## 2016-02-25 LAB — URINALYSIS, ROUTINE W REFLEX MICROSCOPIC
BILIRUBIN URINE: NEGATIVE
Glucose, UA: NEGATIVE mg/dL
Leukocytes, UA: NEGATIVE
NITRITE: NEGATIVE
PH: 6 (ref 5.0–8.0)
Protein, ur: NEGATIVE mg/dL
Specific Gravity, Urine: 1.015 (ref 1.005–1.030)

## 2016-02-25 LAB — URINE MICROSCOPIC-ADD ON

## 2016-02-25 NOTE — Discharge Instructions (Signed)
Urinalysis showed no infection.  Follow-up your primary care doctor

## 2016-02-25 NOTE — Progress Notes (Signed)
Location:   [facility] Room Number: 139/D Place of Service:  SNF (31) Provider:  Edmon Crape  No primary care provider on file.  Patient Care Team: Roma Kayser, MD (Endocrinology) Laqueta Linden, MD as Attending Physician (Cardiology) Mahlon Gammon, MD as Consulting Physician [facility])  Extended Emergency Contact Information Primary Emergency Contact: Sabourin,Doris J Address: [address]          [place], [place] [address] [place] of [place] Home Phone: [phone] Relation: Spouse Secondary Emergency Contact: Rosano,Jason Address: [address]           [place], [place] [address] [place] of [place] Home Phone: [phone] Mobile Phone: [phone] Relation: Son  Code Status:  DNR Goals of care: Advanced Directive information Advanced Directives 02/25/2016  Does patient have an advance directive? Yes  Type of Advance Directive Out of facility DNR (pink MOST or yellow form)  Does patient want to make changes to advanced directive? No - Patient declined  Copy of advanced directive(s) in chart? Yes  Would patient like information on creating an advanced directive? -  Pre-existing out of facility DNR order (yellow form or pink MOST form) -     Chief Complaint  Patient presents with  . Acute Visit    Acute Back Pain    HPI:  Pt is a 82 y.o. male seen today for an acute visit for Acute onset of low back pain.  Apparently patient was in his usual state of health earlier today-then was noted to be slumping over complaining of excruciating back pain.  Normally he ambulates with his walker with therapy and does quite well but apparently had to stop numerous times today saying he could not go on because of the pain-slumping over and  --per staff__appeared to have leg weakness.  According the patient and staff pain appears to be centered in his low back-does not appear to have radiation of the pain however with patient's dementia this is  difficult to fully appraise  Currently he is resting in bed comfortably actually denying pain  lying down-his vital signs are stable he does not complain of any numbness headache nursing staff has not noted any slurred speech or strength deficits  He does have a history of chronic lower extremity edema has compression on.   It does not appear he has a significant orthopedic history so this is quite unusual with the pain complaints   especially in its acute onset       Past Medical History:  Diagnosis Date  . Carotid artery occlusion    a. 05/2012 U/S Bilat ICA 20-39%.  . DM (diabetes mellitus) (HCC)   . Hiatal hernia   . HTN (hypertension)   . Macular degeneration   . Nephrolithiasis   . Obesity   . Stroke [facility])    a. 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 01/2009 TEE: EF 60-65%, mild MR, no LA/LAA thrombus.   Past Surgical History:  Procedure Laterality Date  . CATARACT EXTRACTION W/PHACO Right 10/02/2012   Procedure: CATARACT EXTRACTION PHACO AND INTRAOCULAR LENS PLACEMENT (IOC);  Surgeon: Gemma Payor, MD;  Location: [facility] ORS;  Service: Ophthalmology;  Laterality: Right;  CDE 12.78  . CATARACT EXTRACTION W/PHACO Left 10/30/2012   Procedure: CATARACT EXTRACTION PHACO AND INTRAOCULAR LENS PLACEMENT (IOC);  Surgeon: Gemma Payor, MD;  Location: [facility] ORS;  Service: Ophthalmology;  Laterality: Left;  CDE: 19.10  . EYE SURGERY    . FINGER SURGERY Left    pinky, after  saw cut it off  . LEFT HEART CATHETERIZATION WITH CORONARY ANGIOGRAM Bilateral 03/08/2013   Procedure: LEFT HEART CATHETERIZATION WITH CORONARY ANGIOGRAM;  Surgeon: Laurey Morale, MD;  Location: [facility] CATH LAB;  Service: Cardiovascular;  Laterality: Bilateral;  . TEE WITHOUT CARDIOVERSION N/A 12/16/2014   Procedure: TRANSESOPHAGEAL ECHOCARDIOGRAM (TEE);  Surgeon: Antoine Poche, MD;  Location: [facility] ENDO SUITE;  Service: Cardiology;  Laterality: N/A;  will need 40 minutes to clean scope between procedures    Allergies    Allergen Reactions  . Aspirin Hives and Rash      Medication List       Accurate as of 02/25/16  2:32 PM. Always use your most recent med list.          albuterol (2.5 MG/3ML) 0.083% nebulizer solution Commonly known as:  PROVENTIL 1 inhalation four times a day 8 am, 12  Pm, 4 pm, 9 pm for wheezing and SOB   ALOE VESTA PROTECTIVE Oint Apply to sacrum and bilateral buttock three times daily   atorvastatin 10 MG tablet Commonly known as:  LIPITOR Take 10 mg by mouth daily.   B-12 1000 MCG Tbcr Take 1 tablet by mouth 2 (two) times daily.   carvedilol 3.125 MG tablet Commonly known as:  COREG TAKE 1 TABLET BY MOUTH TWICE DAILY WITH MEALS.   donepezil 10 MG tablet Commonly known as:  ARICEPT Take 10 mg by mouth daily after supper.   ELIQUIS 5 MG Tabs tablet Generic drug:  apixaban Take 5 mg by mouth 2 (two) times daily.   ENSURE Take one by mouth once daily   escitalopram 5 MG tablet Commonly known as:  LEXAPRO Take 5 mg by mouth every evening.   FLOMAX 0.4 MG Caps capsule Generic drug:  tamsulosin Take 0.4 mg by mouth at bedtime.   furosemide 20 MG tablet Commonly known as:  LASIX Take 20 mg by mouth daily.   lisinopril 5 MG tablet Commonly known as:  PRINIVIL,ZESTRIL Take 1 tablet (5 mg total) by mouth daily.   LORazepam 0.5 MG tablet Commonly known as:  ATIVAN Take 0.25 mg by mouth at bedtime.   metFORMIN 500 MG tablet Commonly known as:  GLUCOPHAGE Take 250 mg by mouth every evening.   metFORMIN 500 MG tablet Commonly known as:  GLUCOPHAGE Take 500 mg by mouth daily.   multivitamin with minerals Tabs tablet Take 1 tablet by mouth daily.   nitroGLYCERIN 0.4 MG SL tablet Commonly known as:  NITROSTAT Place 1 tablet (0.4 mg total) under the tongue every 5 (five) minutes as needed for chest pain.   OXYGEN 2L/ mn via N/C prn to maintain stas >90 % Every shift   traMADol 50 MG tablet Commonly known as:  ULTRAM Take 1 tablet (50 mg total) by  mouth 3 (three) times daily as needed for moderate pain. For pain   vitamin C 1000 MG tablet Take 1,500 mg by mouth daily.   Vitamin D 2000 units Caps Take 1 capsule by mouth daily after supper.       Review of Systems   This is limited secondary to dementia provided as well by his wife who is in the room and nursing staff.  In general no complaints of fever or chills.  Skin does not complain of itching or rashes.  Head ears eyes nose mouth and throat does not complain of visual changes or difficulty swallowing.  Respiratory is not complaining of shortness breath or cough.  Cardiac denies chest pain  has chronic lower extremity edema.  GU does not complain of dysuria.  GI is not complaining of abdominal discomfort nausea or vomiting.  Musculoskeletal again has acute low back pain this appears to be when he is standing up ambulating or sitting down he does not appear to have pain when he is lying down.  Neurologic is not complaining of dizziness numbness or headache.  In psych does have a history of dementia continues to be pleasant somewhat confused.     There is no immunization history on file for this patient. Pertinent  Health Maintenance Due  Topic Date Due  . INFLUENZA VACCINE  04/23/2016 (Originally 12/23/2015)  . FOOT EXAM  10/27/2016 (Originally 02/05/1938)  . OPHTHALMOLOGY EXAM  10/27/2016 (Originally 02/05/1938)  . PNA vac Low Risk Adult (1 of 2 - PCV13) 10/27/2016 (Originally [birth year]05/1995)  . HEMOGLOBIN A1C  07/08/2016   No flowsheet data found. Functional Status Survey:    Vitals:   02/25/16 1402  BP: 122/62  Pulse: 69  Resp: 20  Temp: (!) 96.8 F (36 C)  TempSrc: Oral   There is no height or weight on file to calculate BMI. Physical Exam  In general this is a pleasant elderly male he does not appear to be in distress currently lying in bed but apparently has significant distress when he attempts to ambulate.  Skin is warm and dry.  Eyes pupils  appear reactive to light sclerae and conjunctivae clear visual acuity appears grossly intact.  Chest is clear to auscultation with prolonged expiratory phase which is chronic he is not having any labored breathing.  His heart is regular irregular rate and rhythm with a 3/6 systolic murmur he does have chronic lower extremity edema I would say 1-2 plus he has compression hose on.  His abdomen is obese soft nontender positive bowel sounds.   GU could not really appreciate suprapubic tenderness.  Musculoskeletal is able to move all extremities 4-does have strong grip strength bilaterally is able to lift both of his legs he does not appear to have pain in the hip area when his legs are flexed and extended while he is lying down.  I do not note any deformities.  I could not  appreciate  Acute  tenderness palpation of his back he denied pain  lying down but apparently this changes significantly if he attempts to ambulate.  Neurologic his speech is clear remains confused--could not really appreciate any significant lateralizing findings-touch sensation is intact of his lower extremities and feet bilaterally.  Psych he is oriented to self is pleasant was able to identify his wife could not name the President of the [place] I do not really see anything grossly changed from his baseline although nursing staff feels he's had some increased confusion this morning    Labs reviewed:  Recent Labs  09/26/15 1700 10/01/15 1100 10/29/15 0630  NA 137 136 138  K 4.6 4.5 4.2  CL 99* 98* 101  CO2 31 27 32  GLUCOSE 180* 207* 149*  BUN 24* 22* 17  CREATININE 0.98 0.98 0.84  CALCIUM 9.0 9.0 8.8*    Recent Labs  05/07/15 0740 08/06/15 0745 09/24/15 0720  AST 25 17 16   ALT 16* 13* 14*  ALKPHOS 46 34* 43  BILITOT 0.8 0.5 0.7  PROT 6.4* 5.5* 6.1*  ALBUMIN 3.5 2.9* 3.3*    Recent Labs  05/07/15 0740 08/06/15 0745 09/24/15 0720  WBC 7.4 7.7 7.0  NEUTROABS 5.2 5.4 4.8  HGB 13.1  12.0* 12.7*  HCT 41.1 37.2* 39.8  MCV 88.0 86.9 86.3  PLT 216 176 195   Lab Results  Component Value Date   TSH 1.803 03/07/2013   Lab Results  Component Value Date   HGBA1C 8.0 (H) 01/06/2016   Lab Results  Component Value Date   CHOL 68 08/06/2015   HDL 27 (L) 08/06/2015   LDLCALC 10 08/06/2015   TRIG 154 (H) 08/06/2015   CHOLHDL 2.5 08/06/2015    Significant Diagnostic Results in last 30 days:  No results found.  Assessment/Plan   #1-history of acute low back pain-this is quite unusual for patient apparently he cannot really ambulate at all now secondary to pain-lying down appears to relieve this-will send him to the ER secondary to be acute change to rule out any acute process.  He is not complaining of any neurologic changes headache dizziness or chest pain or abdominal discomfort which is reassuring-again will await ER evaluation  [id number]      London SheerLuster, Sally C, CMA [phone]

## 2016-02-25 NOTE — ED Triage Notes (Signed)
PT from [facility]. Staff states pt c/o sudden lower back pain after lunch today. Denies fall or injury.l pt has hx of Alzheimer. Pt alert/laughing. nad at this time

## 2016-02-25 NOTE — ED Notes (Signed)
Per RN at [facility]. Pt was leaning this am stating he cant sit up straight bc his back hurts. Tramadol was given. RN states pt complaining again after lunch and was leaning forward again, then was sent to ED. RN denies any urinary changes. EDP aware.

## 2016-02-25 NOTE — ED Notes (Signed)
In and out cath attempted with no success. Attempted then with coude and still meeting resistance. EDP aware. Pt has urinal placed at this time. Pt tolerated well with minimal pain.

## 2016-02-25 NOTE — ED Provider Notes (Signed)
[facility] DE[facility] Provider Note   CSN: [id number] Arrival date & time: 02/25/16  1353     History   Chief Complaint Chief Complaint  Patient presents with  . Back Pain    HPI George Cook is a 82 y.o. male.  Level V caveat for dementia. Apparently, patient stated his back hurt at the nursing home. No fall or injury. No abdominal pain or syncope. Pain is now disappeared. No dysuria, hematuria, fever, sweats, chills, nausea, vomiting, diarrhea. Son reports normal behavior.      Past Medical History:  Diagnosis Date  . Carotid artery occlusion    a. 05/2012 U/S Bilat ICA 20-39%.  . DM (diabetes mellitus) (HCC)   . Hiatal hernia   . HTN (hypertension)   . Macular degeneration   . Nephrolithiasis   . Obesity   . Stroke [facility])    a. 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 01/2009 TEE: EF 60-65%, mild MR, no LA/LAA thrombus.    Patient Active Problem List   Diagnosis Date Noted  . Exertional dyspnea 11/10/2015  . Edema 05/09/2015  . CAP (community acquired pneumonia) 03/23/2015  . COPD (chronic obstructive pulmonary disease) (HCC) 03/23/2015  . Acute encephalopathy 03/23/2015  . CVA (cerebral infarction) 12/14/2014  . Type 2 diabetes mellitus (HCC) 03/11/2013  . CAD (coronary artery disease) 03/11/2013  . Moderate aortic stenosis 03/11/2013  . Right leg DVT (HCC) 03/11/2013  . Pulmonary embolism, bilateral (HCC) 03/09/2013  . Essential hypertension 03/07/2013  . Occlusion and stenosis of carotid artery without mention of cerebral infarction 06/07/2012  . FULL INCONTINENCE OF FECES 04/03/2010  . HEMATOCHEZIA 06/20/2009    Past Surgical History:  Procedure Laterality Date  . CATARACT EXTRACTION W/PHACO Right 10/02/2012   Procedure: CATARACT EXTRACTION PHACO AND INTRAOCULAR LENS PLACEMENT (IOC);  Surgeon: Gemma Payor, MD;  Location: [facility] ORS;  Service: Ophthalmology;  Laterality: Right;  CDE 12.78  . CATARACT EXTRACTION W/PHACO Left 10/30/2012   Procedure: CATARACT  EXTRACTION PHACO AND INTRAOCULAR LENS PLACEMENT (IOC);  Surgeon: Gemma Payor, MD;  Location: [facility] ORS;  Service: Ophthalmology;  Laterality: Left;  CDE: 19.10  . EYE SURGERY    . FINGER SURGERY Left    pinky, after saw cut it off  . LEFT HEART CATHETERIZATION WITH CORONARY ANGIOGRAM Bilateral 03/08/2013   Procedure: LEFT HEART CATHETERIZATION WITH CORONARY ANGIOGRAM;  Surgeon: Laurey Morale, MD;  Location: [facility] CATH LAB;  Service: Cardiovascular;  Laterality: Bilateral;  . TEE WITHOUT CARDIOVERSION N/A 12/16/2014   Procedure: TRANSESOPHAGEAL ECHOCARDIOGRAM (TEE);  Surgeon: Antoine Poche, MD;  Location: [facility] ENDO SUITE;  Service: Cardiology;  Laterality: N/A;  will need 40 minutes to clean scope between procedures       Home Medications    Prior to Admission medications   Medication Sig Start Date End Date Taking? Authorizing Provider  albuterol (PROVENTIL) (2.5 MG/3ML) 0.083% nebulizer solution 1 inhalation four times a day 8 am, 12  Pm, 4 pm, 9 pm for wheezing and SOB   Yes Historical Provider, MD  apixaban (ELIQUIS) 5 MG TABS tablet Take 5 mg by mouth 2 (two) times daily.   Yes Historical Provider, MD  Ascorbic Acid (VITAMIN C) 1000 MG tablet Take 1,500 mg by mouth daily.    Yes Historical Provider, MD  atorvastatin (LIPITOR) 10 MG tablet Take 10 mg by mouth daily.   Yes Historical Provider, MD  carvedilol (COREG) 3.125 MG tablet TAKE 1 TABLET BY MOUTH TWICE DAILY WITH MEALS. 10/25/14  Yes Laqueta Linden,  MD  Cholecalciferol (VITAMIN D) 2000 UNITS CAPS Take 1 capsule by mouth daily after supper.   Yes Historical Provider, MD  Cyanocobalamin (B-12) 1000 MCG TBCR Take 1 tablet by mouth 2 (two) times daily.   Yes Historical Provider, MD  donepezil (ARICEPT) 10 MG tablet Take 10 mg by mouth daily after supper.    Yes Historical Provider, MD  ENSURE (ENSURE) Take one by mouth once daily   Yes Historical Provider, MD  escitalopram (LEXAPRO) 5 MG tablet Take 5 mg by mouth every evening.   Yes  Historical Provider, MD  furosemide (LASIX) 20 MG tablet Take 20 mg by mouth daily.   Yes Historical Provider, MD  lisinopril (PRINIVIL,ZESTRIL) 5 MG tablet Take 1 tablet (5 mg total) by mouth daily. 03/13/13  Yes Antoine PocheJonathan F Branch, MD  LORazepam (ATIVAN) 0.5 MG tablet Take 0.25 mg by mouth at bedtime.    Yes Historical Provider, MD  metFORMIN (GLUCOPHAGE) 500 MG tablet Take 250 mg by mouth every evening.   Yes Historical Provider, MD  metFORMIN (GLUCOPHAGE) 500 MG tablet Take 500 mg by mouth daily.   Yes Historical Provider, MD  Multiple Vitamin (MULTIVITAMIN WITH MINERALS) TABS tablet Take 1 tablet by mouth daily.   Yes Historical Provider, MD  nitroGLYCERIN (NITROSTAT) 0.4 MG SL tablet Place 1 tablet (0.4 mg total) under the tongue every 5 (five) minutes as needed for chest pain. 03/13/13  Yes Antoine PocheJonathan F Branch, MD  OXYGEN 2L/ mn via N/C prn to maintain stas >90 % Every shift   Yes Historical Provider, MD  Skin Protectants, Misc. (ALOE VESTA PROTECTIVE) OINT Apply to sacrum and bilateral buttock three times daily   Yes Historical Provider, MD  tamsulosin (FLOMAX) 0.4 MG CAPS capsule Take 0.4 mg by mouth at bedtime.   Yes Historical Provider, MD  traMADol (ULTRAM) 50 MG tablet Take 1 tablet (50 mg total) by mouth 3 (three) times daily as needed for moderate pain. For pain 09/11/15  Yes Kermit Baloiffany L Reed, DO    Family History Family History  Problem Relation Age of Onset  . Diabetes Mother   . Stroke Mother   . Emphysema Mother   . Hypertension Mother   . Coronary artery disease Father   . Heart disease Father     Beefore age 660  . Stroke Sister   . Diabetes Sister   . Hypertension Sister   . Coronary artery disease Brother   . Heart disease Brother     Before age 80- CABG    Social History Social History  Substance Use Topics  . Smoking status: Former Smoker    Packs/day: 3.00    Years: 40.00    Types: Cigarettes    Start date: 05/24/1949    Quit date: 05/25/1991  . Smokeless  tobacco: Never Used  . Alcohol use No     Allergies   Aspirin   Review of Systems Review of Systems  Reason unable to perform ROS: dementia.     Physical Exam Updated Vital Signs BP 163/78 (BP Location: Left Arm)   Pulse 67   Temp 98.4 F (36.9 C) (Oral)   Resp 21   Ht 5' 11.5" (1.816 m)   Wt 204 lb (92.5 kg)   SpO2 99%   BMI 28.06 kg/m   Physical Exam  Constitutional: He is oriented to person, place, and time.  Pleasant; NAD  HENT:  Head: Normocephalic and atraumatic.  Eyes: Conjunctivae are normal.  Neck: Neck supple.  Cardiovascular: Normal rate  and regular rhythm.   Pulmonary/Chest: Effort normal and breath sounds normal.  Abdominal: Soft. Bowel sounds are normal.  nontender  Musculoskeletal: Normal range of motion.  No obvious back tenderness  Neurological: He is alert and oriented to person, place, and time.  Skin: Skin is warm and dry.  Psychiatric: He has a normal mood and affect. His behavior is normal.  Nursing note and vitals reviewed.    ED Treatments / Results  Labs (all labs ordered are listed, but only abnormal results are displayed) Labs Reviewed  URINALYSIS, ROUTINE W REFLEX MICROSCOPIC (NOT AT [facility]) - Abnormal; Notable for the following:       Result Value   Hgb urine dipstick TRACE (*)    Ketones, ur TRACE (*)    All other components within normal limits  URINE MICROSCOPIC-ADD ON - Abnormal; Notable for the following:    Squamous Epithelial / LPF 0-5 (*)    Bacteria, UA RARE (*)    All other components within normal limits    EKG  EKG Interpretation None       Radiology No results found.  Procedures Procedures (including critical care time)  Medications Ordered in ED Medications - No data to display   Initial Impression / Assessment and Plan / ED Course  I have reviewed the triage vital signs and the nursing notes.  Pertinent labs & imaging results that were available during my care of the patient were reviewed by  me and considered in my medical decision making (see chart for details).  Clinical Course    Son reports normal behavior. Urinalysis showed a small amount of hemoglobin, but there was also a traumatic attempt to catheterize his urethra. He has no specific complaints at discharge  Final Clinical Impressions(s) / ED Diagnoses   Final diagnoses:  Muscle strain    New Prescriptions New Prescriptions   No medications on file     Donnetta Hutching, MD 02/25/16 1731

## 2016-02-27 ENCOUNTER — Encounter: Payer: Self-pay | Admitting: Internal Medicine

## 2016-02-27 ENCOUNTER — Non-Acute Institutional Stay: Payer: PPO | Admitting: Internal Medicine

## 2016-02-27 DIAGNOSIS — I350 Nonrheumatic aortic (valve) stenosis: Secondary | ICD-10-CM

## 2016-02-27 DIAGNOSIS — M545 Low back pain, unspecified: Secondary | ICD-10-CM

## 2016-02-27 DIAGNOSIS — I5032 Chronic diastolic (congestive) heart failure: Secondary | ICD-10-CM

## 2016-02-28 NOTE — Progress Notes (Signed)
This is an acute visit.  Level care skilled.  Facility [facility] nursing.  Chief complaint-acute visit follow-up back pain ER visit.  History of present illness.  Patient is a pleasant 82 year old male wheezing seen today for follow-up of acute back pain.  When I saw him earlier this week nursing staff noted that he could not really stand up straight while trying to ambulate and appeared to have significant acute onset back pain.  When he was put to bed the pain was significantly improved he appeared to be actually comfortable-however the pain while ambulating apparently was an acute change  he was sent to the ER for evaluation.  ER evaluation was quite benign he was thought to have muscle sprain he does have tramadol as needed when necessary and actually today he is doing much better he is ambulating in his wheelchair currently and has no complaints.  There was assistance possibly had a urinary tract infection but urinalysis looks quite benign.  Past Medical History:  Diagnosis Date  . Carotid artery occlusion    a. 05/2012 U/S Bilat ICA 20-39%.  . DM (diabetes mellitus) (HCC)   . Hiatal hernia   . HTN (hypertension)   . Macular degeneration   . Nephrolithiasis   . Obesity   . Stroke [facility](HCC)    a. 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 01/2009 TEE: EF 60-65%, mild MR, no LA/LAA thrombus.        Past Surgical History:  Procedure Laterality Date  . CATARACT EXTRACTION W/PHACO Right 10/02/2012   Procedure: CATARACT EXTRACTION PHACO AND INTRAOCULAR LENS PLACEMENT (IOC);  Surgeon: Gemma PayorKerry Hunt, MD;  Location: [facility] ORS;  Service: Ophthalmology;  Laterality: Right;  CDE 12.78  . CATARACT EXTRACTION W/PHACO Left 10/30/2012   Procedure: CATARACT EXTRACTION PHACO AND INTRAOCULAR LENS PLACEMENT (IOC);  Surgeon: Gemma PayorKerry Hunt, MD;  Location: [facility] ORS;  Service: Ophthalmology;  Laterality: Left;  CDE: 19.10  . EYE SURGERY    . FINGER SURGERY Left    pinky, after saw cut it off  .  LEFT HEART CATHETERIZATION WITH CORONARY ANGIOGRAM Bilateral 03/08/2013   Procedure: LEFT HEART CATHETERIZATION WITH CORONARY ANGIOGRAM;  Surgeon: Laurey Moralealton S McLean, MD;  Location[facility] CATH LAB;  Service: Cardiovascular;  Laterality: Bilateral;  . TEE WITHOUT CARDIOVERSION N/A 12/16/2014   Procedure: TRANSESOPHAGEAL ECHOCARDIOGRAM (TEE);  Surgeon: Antoine PocheJonathan F Branch, MD;  Location: [facility] ENDO SUITE;  Service: Cardiology;  Laterality: N/A;  will need 40 minutes to clean scope between procedures        Allergies  Allergen Reactions  . Aspirin Hives and Rash          Medication List           Accurate as of 02/25/16  2:32 PM. Always use your most recent med list.           albuterol (2.5 MG/3ML) 0.083% nebulizer solution Commonly known as:  PROVENTIL 1 inhalation four times a day 8 am, 12  Pm, 4 pm, 9 pm for wheezing and SOB  ALOE VESTA PROTECTIVE Oint Apply to sacrum and bilateral buttock three times daily  atorvastatin 10 MG tablet Commonly known as:  LIPITOR Take 10 mg by mouth daily.  B-12 1000 MCG Tbcr Take 1 tablet by mouth 2 (two) times daily.  carvedilol 3.125 MG tablet Commonly known as:  COREG TAKE 1 TABLET BY MOUTH TWICE DAILY WITH MEALS.  donepezil 10 MG tablet Commonly known as:  ARICEPT Take 10 mg by mouth daily after supper.  ELIQUIS  5 MG Tabs tablet Generic drug:  apixaban Take 5 mg by mouth 2 (two) times daily.  ENSURE Take one by mouth once daily  escitalopram 5 MG tablet Commonly known as:  LEXAPRO Take 5 mg by mouth every evening.  FLOMAX 0.4 MG Caps capsule Generic drug:  tamsulosin Take 0.4 mg by mouth at bedtime.  furosemide 20 MG tablet Commonly known as:  LASIX Take 20 mg by mouth daily.  lisinopril 5 MG tablet Commonly known as:  PRINIVIL,ZESTRIL Take 1 tablet (5 mg total) by mouth daily.  LORazepam 0.5 MG tablet Commonly known as:  ATIVAN Take 0.25 mg by mouth at bedtime.  metFORMIN 500 MG tablet Commonly known as:   GLUCOPHAGE Take 250 mg by mouth every evening.  metFORMIN 500 MG tablet Commonly known as:  GLUCOPHAGE Take 500 mg by mouth daily.  multivitamin with minerals Tabs tablet Take 1 tablet by mouth daily.  nitroGLYCERIN 0.4 MG SL tablet Commonly known as:  NITROSTAT Place 1 tablet (0.4 mg total) under the tongue every 5 (five) minutes as needed for chest pain.  OXYGEN 2L/ mn via N/C prn to maintain stas >90 % Every shift  traMADol 50 MG tablet Commonly known as:  ULTRAM Take 1 tablet (50 mg total) by mouth 3 (three) times daily as needed for moderate pain. For pain  vitamin C 1000 MG tablet Take 1,500 mg by mouth daily.  Vitamin D 2000 units Caps Take 1 capsule by mouth daily after supper.      Review of Systems   This is limited secondary to dementia provided as well by his wife who is in the room and nursing staff.  In general no complaints of fever or chills.  Skin does not complain of itching or rashes.  Head ears eyes nose mouth and throat does not complain of visual changes or difficulty swallowing.  Respiratory is not complaining of shortness breath or cough.  Cardiac denies chest pain has chronic lower extremity edema.  GU does not complain of dysuria.  GI is not complaining of abdominal discomfort nausea or vomiting.  Musculoskeletal a Actually is not complaining of back pain today he is ambulating in the wheelchair  Neurologic is not complaining of dizziness numbness or headache.   psych does have a history of dementia continues to be pleasant somewhat confused.     There is no immunization history on file for this patient.     Pertinent  Health Maintenance Due  Topic Date Due  . INFLUENZA VACCINE  04/23/2016 (Originally 12/23/2015)  . FOOT EXAM  10/27/2016 (Originally 02/05/1938)  . OPHTHALMOLOGY EXAM  10/27/2016 (Originally 02/05/1938)  . PNA vac Low Risk Adult (1 of 2 - PCV13) 10/27/2016 (Originally 02/05/1995)  . HEMOGLOBIN A1C   07/08/2016   No flowsheet data found. Functional Status Survey:   He is afebrile pulse is 68 respirations of 16 blood pressure is pending  Physical Exam  In general this is a pleasant elderly male he does not appear to be in distress  Currently sitting comfortably in his wheelchair   Skin is warm and dry.  Eyes pupils appear reactive to light sclerae and conjunctivae clear visual acuity appears grossly intact.  Chest generally   clear to auscultation with prolonged expiratory phase which is chronic he is not having any labored breathing. There are some mild bronchial sounds  His heart is regular irregular rate and rhythm with a 3/6 systolic murmur he does have chronic lower extremity edema -- 1-2 plus he  has compression hose on.  His abdomen is obese soft nontender positive bowel sounds.  .  Musculoskeletal is able to move all extremities 4-does have strong grip strength bilaterally is able to lift both of his legs he does not appear to have pain in the hip area Could not really appreciate tenderness to palpation of his back I do not note any deformities he appears to be ambulating well with his wheelchair.  I do not note any deformities.    Neurologic his speech is clear remains confused--could not really appreciate any significant lateralizing findings-touch sensation is intact of his lower extremities and feet bilaterally.  Psych he is oriented to self is pleasant with some confusion which is baseline    Labs.  10/23/2015.  Sodium 138 potassium 4.2 BUN 17 creatinine 0.84.  09/24/2015.  WBC 7.0 hemoglobin 12.7 platelets 195.  Assessment plan.  #1 back pain-this was thought to be muscle strain per ER evaluation-he is no longer complaining of pain but this will have to watchshe does have tramadol as needed 3 times a day when necessary he appears to be back at his baseline  #2-history of grade 1 diastolic CHFshe is on Lasix-will update a metabolic  panel make sure renal function and electrolytes are stable last creatinine was 0.4 BUN of 17 this will warrant updating.  His edema appears to be at baseline he does not have increased shortness of breath or chest congestion from baseline  #3-history of calcified aortic stenosis-this appears to be stable he continues with a murmur which appears to be relatively unchanged-he continues on anticoagulation with Eliquistt will check a CBC to ensure stability of hemoglobin  [phone]

## 2016-03-02 ENCOUNTER — Encounter: Payer: Self-pay | Admitting: Internal Medicine

## 2016-03-02 NOTE — Progress Notes (Signed)
Location:   [facility] Room Number: 139/D Place of Service:  SNF (31) Provider:  Einar Crow  No PCP Per Patient  Patient Care Team: No Pcp Per Patient as PCP - General (General Practice) Roma Kayser, MD (Endocrinology) Laqueta Linden, MD as Attending Physician (Cardiology) Mahlon Gammon, MD as Consulting Physician (Geriatric Medicine)  Extended Emergency Contact Information Primary Emergency Contact: Gurganus,Doris J Address: [address]          [place], [place] [address] [place] Home Phone: [phone] Relation: Spouse Secondary Emergency Contact: Rajan,Jason Address: [address]           [place], [place] [address] [place] Home Phone: [phone] Mobile Phone: [phone] Relation: Son  Code Status:  Anjali,Gupta Goals of care: Advanced Directive information Advanced Directives 03/02/2016  Does patient have an advance directive? Yes  Type of Advance Directive Out of facility DNR (pink MOST or yellow form)  Does patient want to make changes to advanced directive? No - Patient declined  Copy of advanced directive(s) in chart? Yes  Would patient like information on creating an advanced directive? -  Pre-existing out of facility DNR order (yellow form or pink MOST form) -     Chief Complaint  Patient presents with  . Medical Management of Chronic Issues    Routine Visit    HPI:  Pt is a 82 y.o. male seen today for medical management of chronic diseases.     Past Medical History:  Diagnosis Date  . Carotid artery occlusion    a. 05/2012 U/S Bilat ICA 20-39%.  . DM (diabetes mellitus) (HCC)   . Hiatal hernia   . HTN (hypertension)   . Macular degeneration   . Nephrolithiasis   . Obesity   . Stroke [facility])    a. 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 01/2009 TEE: EF 60-65%, mild MR, no LA/LAA thrombus.   Past Surgical History:  Procedure Laterality Date  . CATARACT EXTRACTION W/PHACO Right  10/02/2012   Procedure: CATARACT EXTRACTION PHACO AND INTRAOCULAR LENS PLACEMENT (IOC);  Surgeon: Gemma Payor, MD;  Location: [facility] ORS;  Service: Ophthalmology;  Laterality: Right;  CDE 12.78  . CATARACT EXTRACTION W/PHACO Left 10/30/2012   Procedure: CATARACT EXTRACTION PHACO AND INTRAOCULAR LENS PLACEMENT (IOC);  Surgeon: Gemma Payor, MD;  Location: [facility] ORS;  Service: Ophthalmology;  Laterality: Left;  CDE: 19.10  . EYE SURGERY    . FINGER SURGERY Left    pinky, after saw cut it off  . LEFT HEART CATHETERIZATION WITH CORONARY ANGIOGRAM Bilateral 03/08/2013   Procedure: LEFT HEART CATHETERIZATION WITH CORONARY ANGIOGRAM;  Surgeon: Laurey Morale, MD;  Location: [facility] CATH LAB;  Service: Cardiovascular;  Laterality: Bilateral;  . TEE WITHOUT CARDIOVERSION N/A 12/16/2014   Procedure: TRANSESOPHAGEAL ECHOCARDIOGRAM (TEE);  Surgeon: Antoine Poche, MD;  Location: [facility] ENDO SUITE;  Service: Cardiology;  Laterality: N/A;  will need 40 minutes to clean scope between procedures    Allergies  Allergen Reactions  . Aspirin Hives and Rash      Medication List       Accurate as of 03/02/16 12:21 PM. Always use your most recent med list.          albuterol (2.5 MG/3ML) 0.083% nebulizer solution Commonly known as:  PROVENTIL 1 inhalation four times a day 8 am, 12  Pm, 4 pm, 9 pm for wheezing and SOB   ALOE VESTA PROTECTIVE Oint Apply to sacrum and bilateral buttock three times  daily   atorvastatin 10 MG tablet Commonly known as:  LIPITOR Take 10 mg by mouth daily.   B-12 1000 MCG Tbcr Take 1 tablet by mouth 2 (two) times daily.   carvedilol 3.125 MG tablet Commonly known as:  COREG TAKE 1 TABLET BY MOUTH TWICE DAILY WITH MEALS.   donepezil 10 MG tablet Commonly known as:  ARICEPT Take 10 mg by mouth daily after supper.   ELIQUIS 5 MG Tabs tablet Generic drug:  apixaban Take 5 mg by mouth 2 (two) times daily.   ENSURE Take one by mouth once daily   escitalopram 5 MG tablet Commonly  known as:  LEXAPRO Take 5 mg by mouth every evening.   FLOMAX 0.4 MG Caps capsule Generic drug:  tamsulosin Take 0.4 mg by mouth at bedtime.   furosemide 20 MG tablet Commonly known as:  LASIX Take 20 mg by mouth daily.   lisinopril 5 MG tablet Commonly known as:  PRINIVIL,ZESTRIL Take 1 tablet (5 mg total) by mouth daily.   LORazepam 0.5 MG tablet Commonly known as:  ATIVAN Take 0.25 mg by mouth 2 (two) times daily.   metFORMIN 500 MG tablet Commonly known as:  GLUCOPHAGE Take 500 mg by mouth every evening. Along with 250 mg to equal 750 mg   metFORMIN 500 MG tablet Commonly known as:  GLUCOPHAGE Take 250 mg by mouth daily. Take along with 500 mg to equal 750 mg   multivitamin with minerals Tabs tablet Take 1 tablet by mouth daily.   nitroGLYCERIN 0.4 MG SL tablet Commonly known as:  NITROSTAT Place 1 tablet (0.4 mg total) under the tongue every 5 (five) minutes as needed for chest pain.   OXYGEN 2L/ mn via N/C prn to maintain stas >90 % Every shift   traMADol 50 MG tablet Commonly known as:  ULTRAM Take 1 tablet (50 mg total) by mouth 3 (three) times daily as needed for moderate pain. For pain   vitamin C 1000 MG tablet Take 1,500 mg by mouth daily.   Vitamin D 2000 units Caps Take 1 capsule by mouth daily after supper.       Review of Systems  Immunization History  Administered Date(s) Administered  . Influenza-Unspecified 02/20/2016   Pertinent  Health Maintenance Due  Topic Date Due  . FOOT EXAM  10/27/2016 (Originally 02/05/1938)  . OPHTHALMOLOGY EXAM  10/27/2016 (Originally 02/05/1938)  . PNA vac Low Risk Adult (1 of 2 - PCV13) 10/27/2016 (Originally 02/05/1995)  . HEMOGLOBIN A1C  07/08/2016  . INFLUENZA VACCINE  Completed   No flowsheet data found. Functional Status Survey:    Vitals:   03/02/16 1153  SpO2: 95%   There is no height or weight on file to calculate BMI. Physical Exam  Labs reviewed:  Recent Labs  09/26/15 1700  10/01/15 1100 10/29/15 0630  NA 137 136 138  K 4.6 4.5 4.2  CL 99* 98* 101  CO2 31 27 32  GLUCOSE 180* 207* 149*  BUN 24* 22* 17  CREATININE 0.98 0.98 0.84  CALCIUM 9.0 9.0 8.8*    Recent Labs  05/07/15 0740 08/06/15 0745 09/24/15 0720  AST 25 17 16   ALT 16* 13* 14*  ALKPHOS 46 34* 43  BILITOT 0.8 0.5 0.7  PROT 6.4* 5.5* 6.1*  ALBUMIN 3.5 2.9* 3.3*    Recent Labs  05/07/15 0740 08/06/15 0745 09/24/15 0720  WBC 7.4 7.7 7.0  NEUTROABS 5.2 5.4 4.8  HGB 13.1 12.0* 12.7*  HCT 41.1 37.2* 39.8  MCV 88.0 86.9 86.3  PLT 216 176 195   Lab Results  Component Value Date   TSH 1.803 03/07/2013   Lab Results  Component Value Date   HGBA1C 8.0 (H) 01/06/2016   Lab Results  Component Value Date   CHOL 68 08/06/2015   HDL 27 (L) 08/06/2015   LDLCALC 10 08/06/2015   TRIG 154 (H) 08/06/2015   CHOLHDL 2.5 08/06/2015    Significant Diagnostic Results in last 30 days:  No results found.  Assessment/Plan There are no diagnoses linked to this encounter.   Family/ staff Communication:   Labs/tests ordered:

## 2016-03-06 IMAGING — MR MR HEAD W/O CM
6 of 10 series · 21 of 48 positions shown · non-contrast
Comparison: 12/13/2014 CT.  02/22/2014 and 02/12/2009 MR.

CLINICAL DATA: 86-year-old diabetic hypertensive male with new
onset right-sided numbness. Prior stroke. Subsequent encounter.

EXAM:
MRI HEAD WITHOUT CONTRAST
MRA HEAD WITHOUT CONTRAST
TECHNIQUE: Multiplanar, multiecho pulse sequences of the brain and surrounding
structures were obtained without intravenous contrast. Angiographic
images of the head were obtained using MRA technique without
contrast.

[Series 5: T2 · axial · 5.0mm · 0.50mm/px · z∈[-52,+91]mm · 2 of 23 slices shown (1 of 2)]
[im 1/23]
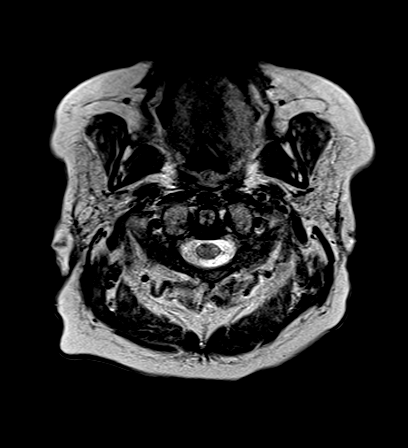
[im 23/23]
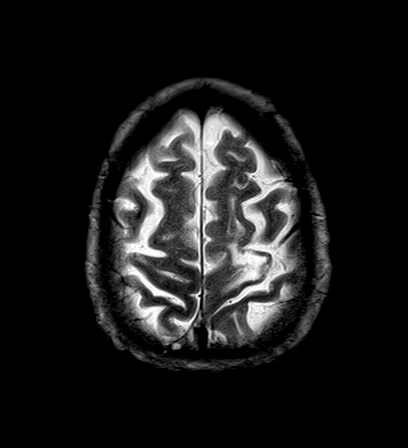

[Series 6: T1 · sagittal · 5.0mm · 0.89mm/px · 2 of 19 slices shown (1 of 2)]
[im 1/19]
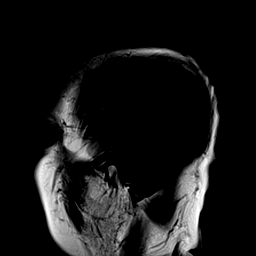
[im 19/19]
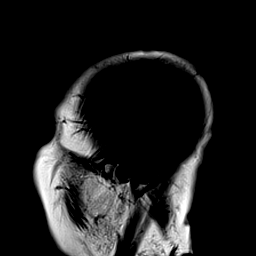

[Series 7: FLAIR · axial · 5.0mm · 0.36mm/px · z∈[-52,+91]mm · 3 of 23 slices shown]
[im 1/23]
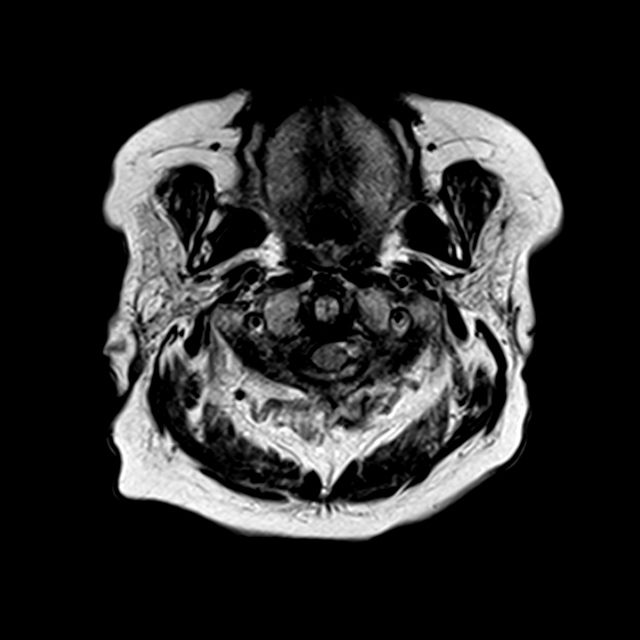
[im 12/23]
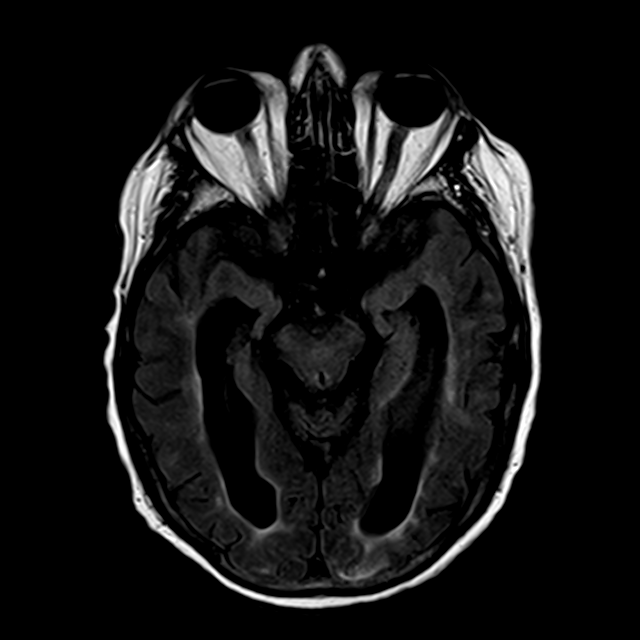
[im 23/23]
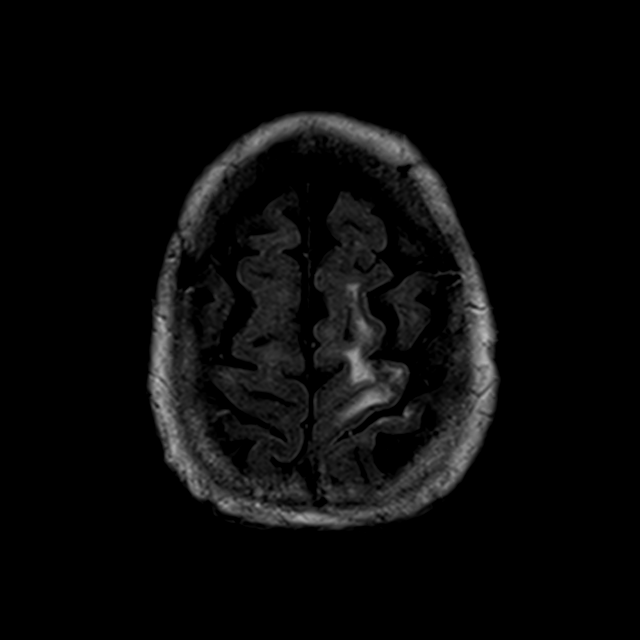

[Series 8: T1 · axial · 2.0mm · 0.43mm/px · z∈[-45,+110]mm · 8 of 79 slices shown (2 of 2)]
[im 1/79]
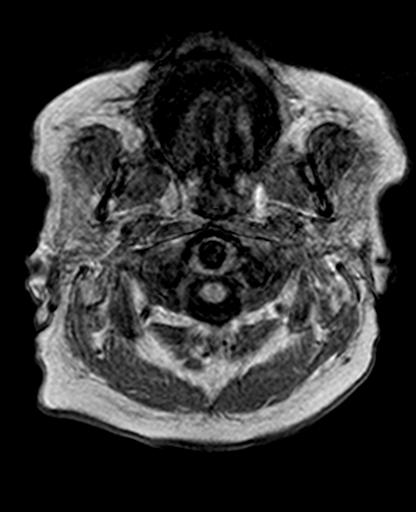
[im 10/79]
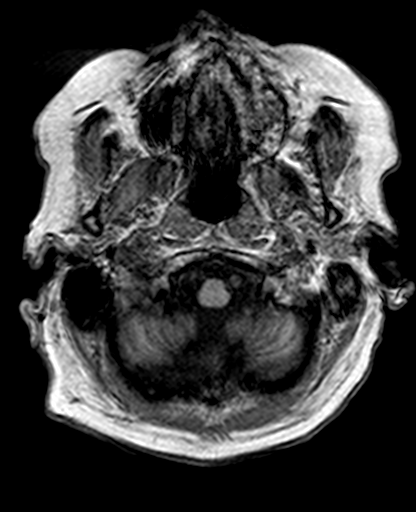
[im 20/79]
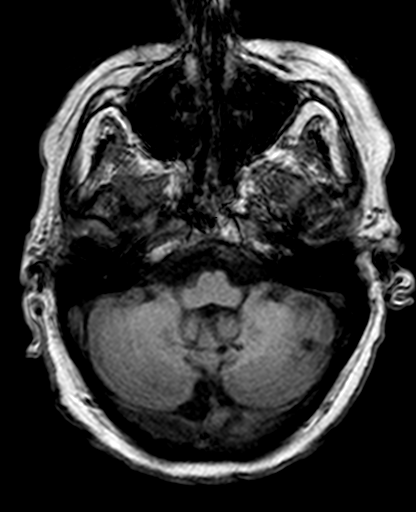
[im 30/79]
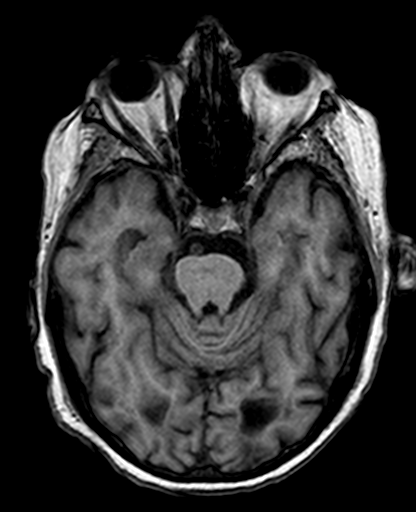
[im 49/79]
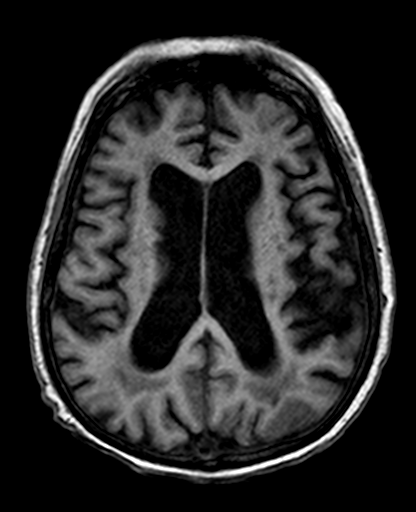
[im 59/79]
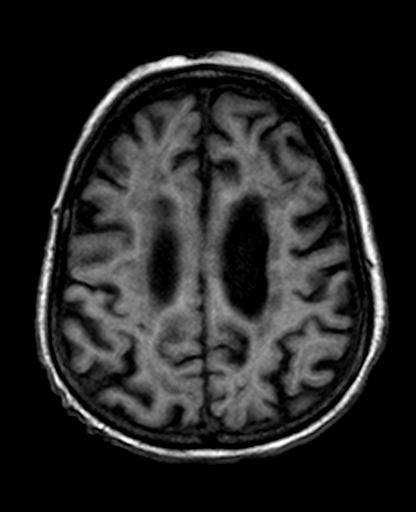
[im 69/79]
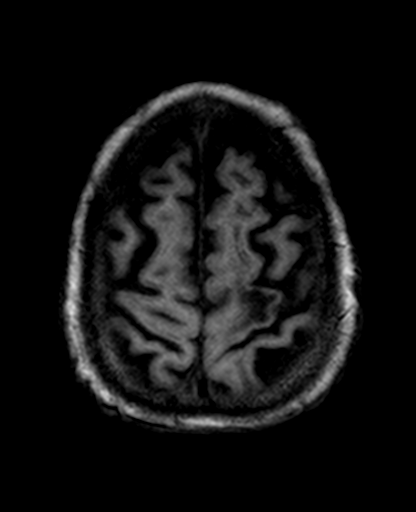
[im 79/79]
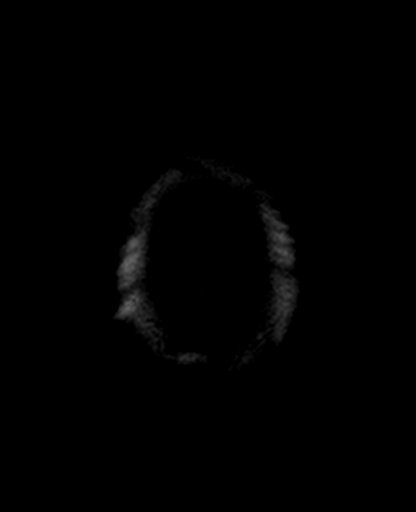

[Series 9: trauma axial · axial · 5.0mm · 0.43mm/px · z∈[-52,+91]mm · 3 of 23 slices shown]
[im 1/23]
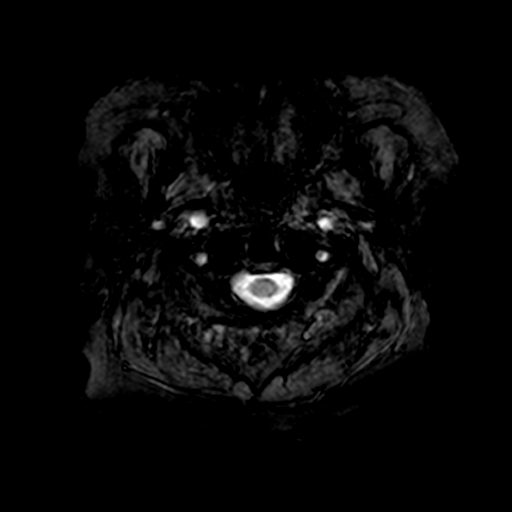
[im 12/23]
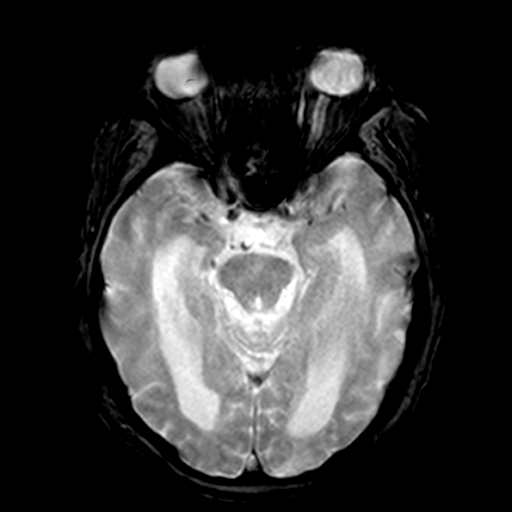
[im 23/23]
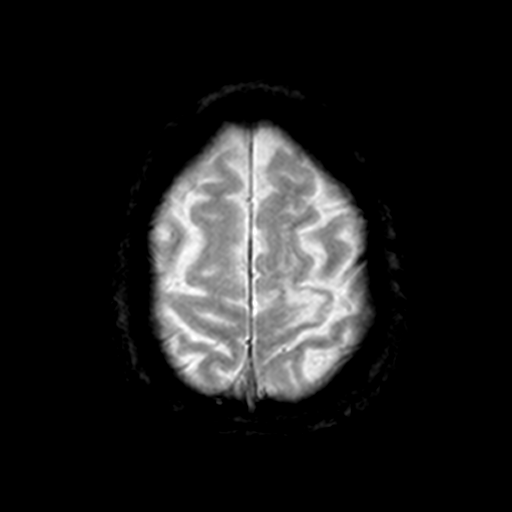

[Series 10: T2 · coronal · 5.0mm · 0.46mm/px · 3 of 28 slices shown (2 of 2)]
[im 1/28]
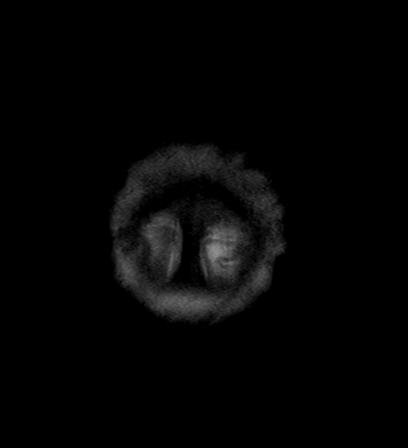
[im 14/28]
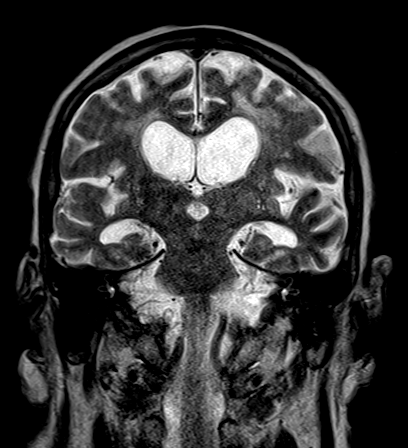
[im 28/28]
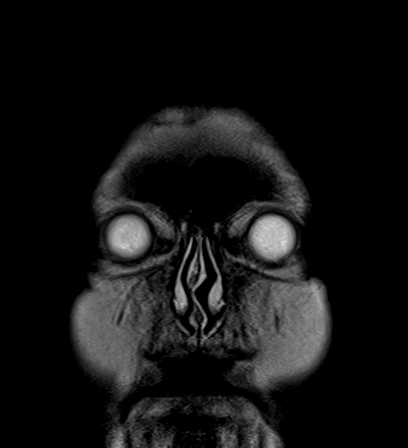

[21 of 48 positions shown; findings below may reference images not displayed]

FINDINGS: MRI HEAD FINDINGS

Small acute nonhemorrhagic infarcts anterior left parietal lobe and
posterior left periventricular region.

Small acute nonhemorrhagic infarct right parietal lobe.

Remote infarcts left frontal -parietal parasagittal position. Remote
infarcts basal ganglia bilaterally and right thalamus. Remote right
corona radiata infarcts. Remote left cerebellar infarct.

Prominent small vessel disease type changes.

Small region of blood breakdown products inferior medial left
cerebellum most consistent with result of prior hemorrhagic
ischemia.

Global atrophy. Ventricular prominence has progressed over time and
may reflect central atrophy although there may also be a component
of superimposed mild hydrocephalus.

Cervical spondylotic changes C3-4.

Post lens replacement with mild exophthalmos.

Cervical medullary junction, pituitary region and pineal region
unremarkable.

Polypoid opacification right maxillary sinus with minimal mucosal
thickening left maxillary sinus.

No intracranial mass lesion noted on this unenhanced exam.

MRA HEAD FINDINGS

Artifact petrous segment of the internal carotid artery bilaterally.

Artifact cavernous segment left internal carotid artery.

Limited evaluation at these levels.

Hypoplastic A1 segment left anterior cerebral artery.

Fenestrated appearance at the junction of the A1 -2 segment of the
right anterior cerebral artery. Azygous configuration anterior
cerebral artery.

Middle cerebral artery moderate to marked branch vessel narrowing
and irregularity.

Moderate narrowing distal left vertebral artery.

Nonvisualized right posterior inferior cerebellar artery. Moderate
tandem stenosis left posterior inferior cerebellar artery.

Mild narrowing mid aspect basilar artery.

Nonvisualized anterior inferior cerebellar artery bilaterally.

Moderate to marked narrowing mid to distal aspect of the posterior
cerebral arteries bilaterally.

Poor delineation of the superior cerebellar arteries beyond the
origin.

No aneurysm noted.
IMPRESSION: MRI HEAD

Small acute nonhemorrhagic infarcts anterior left parietal lobe and
posterior left periventricular region.

Small acute nonhemorrhagic infarct right parietal lobe.

Remote infarcts left frontal -parietal parasagittal position. Remote
infarcts basal ganglia bilaterally and right thalamus. Remote right
corona radiata infarcts. Remote left cerebellar infarct.

Prominent small vessel disease type changes.

Small region of blood breakdown products inferior medial left
cerebellum most consistent with result of prior hemorrhagic
ischemia.

Global atrophy. Ventricular prominence has progressed over time and
may reflect central atrophy although there may also be a component
of superimposed mild hydrocephalus.

MRA HEAD FINDINGS

Intracranial atherosclerotic type changes as detailed above most
notable involving branch vessels.

## 2016-03-25 ENCOUNTER — Emergency Department (HOSPITAL_COMMUNITY): Payer: PPO

## 2016-03-25 ENCOUNTER — Non-Acute Institutional Stay (SKILLED_NURSING_FACILITY): Payer: PPO | Admitting: Internal Medicine

## 2016-03-25 ENCOUNTER — Encounter (HOSPITAL_COMMUNITY): Payer: Self-pay

## 2016-03-25 ENCOUNTER — Inpatient Hospital Stay (HOSPITAL_COMMUNITY)
Admission: EM | Admit: 2016-03-25 | Discharge: 2016-03-31 | DRG: 871 | Disposition: A | Discharge status: Skilled Nursing Facility | Payer: PPO | Attending: Internal Medicine | Admitting: Internal Medicine

## 2016-03-25 DIAGNOSIS — A419 Sepsis, unspecified organism: Secondary | ICD-10-CM | POA: Diagnosis not present

## 2016-03-25 DIAGNOSIS — J810 Acute pulmonary edema: Secondary | ICD-10-CM

## 2016-03-25 DIAGNOSIS — I110 Hypertensive heart disease with heart failure: Secondary | ICD-10-CM | POA: Diagnosis not present

## 2016-03-25 DIAGNOSIS — I160 Hypertensive urgency: Secondary | ICD-10-CM | POA: Diagnosis present

## 2016-03-25 DIAGNOSIS — F0390 Unspecified dementia without behavioral disturbance: Secondary | ICD-10-CM | POA: Diagnosis present

## 2016-03-25 DIAGNOSIS — H3530 Unspecified macular degeneration: Secondary | ICD-10-CM | POA: Diagnosis present

## 2016-03-25 DIAGNOSIS — R0602 Shortness of breath: Secondary | ICD-10-CM

## 2016-03-25 DIAGNOSIS — I5033 Acute on chronic diastolic (congestive) heart failure: Secondary | ICD-10-CM | POA: Diagnosis present

## 2016-03-25 DIAGNOSIS — R000 Tachycardia, unspecified: Secondary | ICD-10-CM | POA: Diagnosis not present

## 2016-03-25 DIAGNOSIS — I82401 Acute embolism and thrombosis of unspecified deep veins of right lower extremity: Secondary | ICD-10-CM | POA: Diagnosis present

## 2016-03-25 DIAGNOSIS — Z86711 Personal history of pulmonary embolism: Secondary | ICD-10-CM

## 2016-03-25 DIAGNOSIS — Z7901 Long term (current) use of anticoagulants: Secondary | ICD-10-CM

## 2016-03-25 DIAGNOSIS — J9600 Acute respiratory failure, unspecified whether with hypoxia or hypercapnia: Secondary | ICD-10-CM | POA: Diagnosis present

## 2016-03-25 DIAGNOSIS — J690 Pneumonitis due to inhalation of food and vomit: Secondary | ICD-10-CM | POA: Diagnosis not present

## 2016-03-25 DIAGNOSIS — E1159 Type 2 diabetes mellitus with other circulatory complications: Secondary | ICD-10-CM

## 2016-03-25 DIAGNOSIS — I248 Other forms of acute ischemic heart disease: Secondary | ICD-10-CM | POA: Diagnosis not present

## 2016-03-25 DIAGNOSIS — R1310 Dysphagia, unspecified: Secondary | ICD-10-CM | POA: Diagnosis not present

## 2016-03-25 DIAGNOSIS — I959 Hypotension, unspecified: Secondary | ICD-10-CM | POA: Diagnosis present

## 2016-03-25 DIAGNOSIS — R0603 Acute respiratory distress: Secondary | ICD-10-CM | POA: Diagnosis not present

## 2016-03-25 DIAGNOSIS — E119 Type 2 diabetes mellitus without complications: Secondary | ICD-10-CM

## 2016-03-25 DIAGNOSIS — R0682 Tachypnea, not elsewhere classified: Secondary | ICD-10-CM | POA: Diagnosis not present

## 2016-03-25 DIAGNOSIS — I2699 Other pulmonary embolism without acute cor pulmonale: Secondary | ICD-10-CM

## 2016-03-25 DIAGNOSIS — Z825 Family history of asthma and other chronic lower respiratory diseases: Secondary | ICD-10-CM

## 2016-03-25 DIAGNOSIS — Z7189 Other specified counseling: Secondary | ICD-10-CM

## 2016-03-25 DIAGNOSIS — I10 Essential (primary) hypertension: Secondary | ICD-10-CM | POA: Diagnosis present

## 2016-03-25 DIAGNOSIS — I350 Nonrheumatic aortic (valve) stenosis: Secondary | ICD-10-CM | POA: Diagnosis present

## 2016-03-25 DIAGNOSIS — I509 Heart failure, unspecified: Secondary | ICD-10-CM | POA: Diagnosis not present

## 2016-03-25 DIAGNOSIS — Z87891 Personal history of nicotine dependence: Secondary | ICD-10-CM | POA: Diagnosis not present

## 2016-03-25 DIAGNOSIS — I69321 Dysphasia following cerebral infarction: Secondary | ICD-10-CM | POA: Diagnosis not present

## 2016-03-25 DIAGNOSIS — J449 Chronic obstructive pulmonary disease, unspecified: Secondary | ICD-10-CM | POA: Diagnosis present

## 2016-03-25 DIAGNOSIS — Z66 Do not resuscitate: Secondary | ICD-10-CM | POA: Diagnosis not present

## 2016-03-25 DIAGNOSIS — F05 Delirium due to known physiological condition: Secondary | ICD-10-CM | POA: Diagnosis present

## 2016-03-25 DIAGNOSIS — Z515 Encounter for palliative care: Secondary | ICD-10-CM

## 2016-03-25 DIAGNOSIS — Z86718 Personal history of other venous thrombosis and embolism: Secondary | ICD-10-CM

## 2016-03-25 DIAGNOSIS — Z833 Family history of diabetes mellitus: Secondary | ICD-10-CM

## 2016-03-25 DIAGNOSIS — R6510 Systemic inflammatory response syndrome (SIRS) of non-infectious origin without acute organ dysfunction: Secondary | ICD-10-CM

## 2016-03-25 DIAGNOSIS — I2510 Atherosclerotic heart disease of native coronary artery without angina pectoris: Secondary | ICD-10-CM | POA: Diagnosis present

## 2016-03-25 DIAGNOSIS — R1312 Dysphagia, oropharyngeal phase: Secondary | ICD-10-CM

## 2016-03-25 DIAGNOSIS — R001 Bradycardia, unspecified: Secondary | ICD-10-CM | POA: Diagnosis present

## 2016-03-25 DIAGNOSIS — J9602 Acute respiratory failure with hypercapnia: Secondary | ICD-10-CM | POA: Diagnosis not present

## 2016-03-25 DIAGNOSIS — J9601 Acute respiratory failure with hypoxia: Secondary | ICD-10-CM | POA: Diagnosis not present

## 2016-03-25 DIAGNOSIS — Z79899 Other long term (current) drug therapy: Secondary | ICD-10-CM | POA: Diagnosis not present

## 2016-03-25 DIAGNOSIS — Z8249 Family history of ischemic heart disease and other diseases of the circulatory system: Secondary | ICD-10-CM

## 2016-03-25 DIAGNOSIS — E872 Acidosis: Secondary | ICD-10-CM | POA: Diagnosis not present

## 2016-03-25 DIAGNOSIS — Z823 Family history of stroke: Secondary | ICD-10-CM

## 2016-03-25 DIAGNOSIS — J811 Chronic pulmonary edema: Secondary | ICD-10-CM | POA: Diagnosis present

## 2016-03-25 HISTORY — DX: Other pulmonary embolism without acute cor pulmonale: I26.99

## 2016-03-25 HISTORY — DX: Chronic obstructive pulmonary disease, unspecified: J44.9

## 2016-03-25 LAB — I-STAT CG4 LACTIC ACID, ED: LACTIC ACID, VENOUS: 4.77 mmol/L — AB (ref 0.5–1.9)

## 2016-03-25 LAB — GLUCOSE, CAPILLARY: Glucose-Capillary: 197 mg/dL — ABNORMAL HIGH (ref 65–99)

## 2016-03-25 LAB — BLOOD GAS, ARTERIAL
ALLENS TEST (PASS/FAIL): POSITIVE — AB
Acid-Base Excess: 0.9 mmol/L (ref 0.0–2.0)
Bicarbonate: 23.5 mmol/L (ref 20.0–28.0)
DRAWN BY: 382351
FIO2: 100
O2 Saturation: 92.2 %
PCO2 ART: 67.4 mmHg — AB (ref 32.0–48.0)
PH ART: 7.237 — AB (ref 7.350–7.450)
Patient temperature: 37
pO2, Arterial: 80.1 mmHg — ABNORMAL LOW (ref 83.0–108.0)

## 2016-03-25 LAB — COMPREHENSIVE METABOLIC PANEL
ALT: 16 U/L — AB (ref 17–63)
AST: 36 U/L (ref 15–41)
Albumin: 3.5 g/dL (ref 3.5–5.0)
Alkaline Phosphatase: 54 U/L (ref 38–126)
Anion gap: 9 (ref 5–15)
BUN: 23 mg/dL — AB (ref 6–20)
CO2: 27 mmol/L (ref 22–32)
CREATININE: 0.99 mg/dL (ref 0.61–1.24)
Calcium: 8.7 mg/dL — ABNORMAL LOW (ref 8.9–10.3)
Chloride: 99 mmol/L — ABNORMAL LOW (ref 101–111)
GFR calc Af Amer: >60 mL/min (ref >60)
Glucose, Bld: 261 mg/dL — ABNORMAL HIGH (ref 65–99)
Potassium: 4.6 mmol/L (ref 3.5–5.1)
Sodium: 135 mmol/L (ref 135–145)
TOTAL PROTEIN: 6.8 g/dL (ref 6.5–8.1)
Total Bilirubin: 0.7 mg/dL (ref 0.3–1.2)

## 2016-03-25 LAB — CBC WITH DIFFERENTIAL/PLATELET
BASOS ABS: 0.1 10*3/uL (ref 0.0–0.1)
Basophils Relative: 0 %
EOS PCT: 3 %
Eosinophils Absolute: 0.5 10*3/uL (ref 0.0–0.7)
HEMATOCRIT: 41.6 % (ref 39.0–52.0)
Hemoglobin: 13.6 g/dL (ref 13.0–17.0)
LYMPHS ABS: 3.9 10*3/uL (ref 0.7–4.0)
LYMPHS PCT: 23 %
MCH: 28.7 pg (ref 26.0–34.0)
MCHC: 32.7 g/dL (ref 30.0–36.0)
MCV: 87.8 fL (ref 78.0–100.0)
MONO ABS: 1 10*3/uL (ref 0.1–1.0)
Monocytes Relative: 6 %
NEUTROS ABS: 11.3 10*3/uL — AB (ref 1.7–7.7)
Neutrophils Relative %: 68 %
PLATELETS: 271 10*3/uL (ref 150–400)
RBC: 4.74 MIL/uL (ref 4.22–5.81)
RDW: 13.3 % (ref 11.5–15.5)
WBC: 16.8 10*3/uL — ABNORMAL HIGH (ref 4.0–10.5)

## 2016-03-25 LAB — I-STAT TROPONIN, ED: Troponin i, poc: 0.01 ng/mL (ref 0.00–0.08)

## 2016-03-25 LAB — TROPONIN I: TROPONIN I: 0.06 ng/mL — AB (ref <0.03)

## 2016-03-25 LAB — LACTIC ACID, PLASMA: LACTIC ACID, VENOUS: 2.7 mmol/L — AB (ref 0.5–1.9)

## 2016-03-25 LAB — BRAIN NATRIURETIC PEPTIDE: B Natriuretic Peptide: 340 pg/mL — ABNORMAL HIGH (ref 0.0–100.0)

## 2016-03-25 MED ORDER — IPRATROPIUM-ALBUTEROL 0.5-2.5 (3) MG/3ML IN SOLN
3.0000 mL | Freq: Once | RESPIRATORY_TRACT | Status: AC
  Administered 2016-03-25: 3 mL via RESPIRATORY_TRACT

## 2016-03-25 MED ORDER — FUROSEMIDE 10 MG/ML IJ SOLN: 40.0000 mg | Freq: Two times a day (BID) | INTRAMUSCULAR | Status: DC

## 2016-03-25 MED ORDER — ACETAMINOPHEN 325 MG PO TABS: 650.0000 mg | ORAL_TABLET | ORAL | Status: DC | PRN

## 2016-03-25 MED ORDER — IPRATROPIUM-ALBUTEROL 0.5-2.5 (3) MG/3ML IN SOLN
3.0000 mL | RESPIRATORY_TRACT | Status: DC | PRN
  Administered 2016-03-26 (×2): 3 mL via RESPIRATORY_TRACT
  Filled 2016-03-25: qty 3

## 2016-03-25 MED ORDER — VANCOMYCIN HCL IN DEXTROSE 750-5 MG/150ML-% IV SOLN
750.0000 mg | Freq: Two times a day (BID) | INTRAVENOUS | Status: DC
  Administered 2016-03-26 – 2016-03-29 (×8): 750 mg via INTRAVENOUS
  Filled 2016-03-25 (×11): qty 150

## 2016-03-25 MED ORDER — SODIUM CHLORIDE 0.9% FLUSH: 3.0000 mL | INTRAVENOUS | Status: DC | PRN

## 2016-03-25 MED ORDER — ONDANSETRON HCL 4 MG/2ML IJ SOLN: 4.0000 mg | Freq: Four times a day (QID) | INTRAMUSCULAR | Status: DC | PRN

## 2016-03-25 MED ORDER — HYDRALAZINE HCL 20 MG/ML IJ SOLN
10.0000 mg | INTRAMUSCULAR | Status: DC | PRN
  Administered 2016-03-25: 10 mg via INTRAVENOUS
  Filled 2016-03-25: qty 1

## 2016-03-25 MED ORDER — IPRATROPIUM-ALBUTEROL 0.5-2.5 (3) MG/3ML IN SOLN
3.0000 mL | RESPIRATORY_TRACT | Status: DC | PRN
  Administered 2016-03-26 – 2016-03-30 (×3): 3 mL via RESPIRATORY_TRACT
  Filled 2016-03-25 (×4): qty 3

## 2016-03-25 MED ORDER — FUROSEMIDE 10 MG/ML IJ SOLN
40.0000 mg | Freq: Once | INTRAMUSCULAR | Status: AC
  Administered 2016-03-25: 40 mg via INTRAVENOUS
  Filled 2016-03-25: qty 4

## 2016-03-25 MED ORDER — INSULIN ASPART 100 UNIT/ML ~~LOC~~ SOLN
0.0000 [IU] | SUBCUTANEOUS | Status: DC
  Administered 2016-03-26: 2 [IU] via SUBCUTANEOUS
  Administered 2016-03-26: 5 [IU] via SUBCUTANEOUS
  Administered 2016-03-27 (×2): 3 [IU] via SUBCUTANEOUS
  Administered 2016-03-27: 1 [IU] via SUBCUTANEOUS
  Administered 2016-03-27: 2 [IU] via SUBCUTANEOUS

## 2016-03-25 MED ORDER — ENOXAPARIN SODIUM 80 MG/0.8ML ~~LOC~~ SOLN
1.0000 mg/kg | Freq: Two times a day (BID) | SUBCUTANEOUS | Status: DC
  Administered 2016-03-25 – 2016-03-26 (×3): 80 mg via SUBCUTANEOUS
  Filled 2016-03-25 (×3): qty 0.8

## 2016-03-25 MED ORDER — SODIUM CHLORIDE 0.9% FLUSH
3.0000 mL | Freq: Two times a day (BID) | INTRAVENOUS | Status: DC
  Administered 2016-03-25: 3 mL via INTRAVENOUS

## 2016-03-25 MED ORDER — NITROGLYCERIN IN D5W 200-5 MCG/ML-% IV SOLN: 300.0000 ug/min | Freq: Once | INTRAVENOUS | Status: DC

## 2016-03-25 MED ORDER — PIPERACILLIN-TAZOBACTAM 3.375 G IVPB
3.3750 g | Freq: Three times a day (TID) | INTRAVENOUS | Status: DC
  Administered 2016-03-26 – 2016-03-30 (×13): 3.375 g via INTRAVENOUS
  Filled 2016-03-25 (×13): qty 50

## 2016-03-25 MED ORDER — SODIUM CHLORIDE 0.9 % IV SOLN: 250.0000 mL | INTRAVENOUS | Status: DC | PRN

## 2016-03-25 MED ORDER — VANCOMYCIN HCL IN DEXTROSE 1-5 GM/200ML-% IV SOLN
1000.0000 mg | Freq: Once | INTRAVENOUS | Status: AC
  Administered 2016-03-25: 1000 mg via INTRAVENOUS
  Filled 2016-03-25: qty 200

## 2016-03-25 MED ORDER — FUROSEMIDE 10 MG/ML IJ SOLN
20.0000 mg | Freq: Once | INTRAMUSCULAR | Status: AC
  Administered 2016-03-25: 20 mg via INTRAVENOUS
  Filled 2016-03-25: qty 2

## 2016-03-25 MED ORDER — ONDANSETRON HCL 4 MG/2ML IJ SOLN
4.0000 mg | Freq: Once | INTRAMUSCULAR | Status: AC
  Administered 2016-03-25: 4 mg via INTRAVENOUS
  Filled 2016-03-25: qty 2

## 2016-03-25 MED ORDER — PIPERACILLIN-TAZOBACTAM 3.375 G IVPB 30 MIN
3.3750 g | Freq: Once | INTRAVENOUS | Status: AC
  Administered 2016-03-25: 3.375 g via INTRAVENOUS
  Filled 2016-03-25: qty 50

## 2016-03-25 NOTE — Progress Notes (Signed)
Pharmacy Antibiotic Note  George Cook is a 82 y.o. male admitted on 03/25/2016 with sepsis.  Pharmacy has been consulted for Vancomycin and Zosyn  dosing.  Plan: Vancomycin 750 mg IV every 12 hours.  Goal trough 15-20 mcg/mL. Zosyn 3.375g IV q8h (4 hour infusion).  Monitor labs, micro and vitals.   Height: 5\' 11"  (180.3 cm) Weight: 180 lb (81.6 kg) IBW/kg (Calculated) : 75.3  No data recorded.   Recent Labs Lab 03/25/16 1856 03/25/16 1916  WBC 16.8*  --   CREATININE 0.99  --   LATICACIDVEN  --  4.77*    Estimated Creatinine Clearance: 57 mL/min (by C-G formula based on SCr of 0.99 mg/dL).    Allergies  Allergen Reactions  . Aspirin Hives and Rash    Antimicrobials this admission: Zosyn 11/2 >>  Vanc 11/2 >>   Dose adjustments this admission: n/a  Microbiology results: 11/2 BCx: pending 11/2 UCx: pending  11/2 MRSA PCR: pending  Thank you for allowing pharmacy to be a part of this patient's care.  George Cook, George Cook 03/25/2016 10:25 PM

## 2016-03-25 NOTE — H&P (Signed)
History and Physical    George Cook VHQ:[id number] DOB: 1927-07-15 DOA: 03/25/2016  Referring MD/NP/PA: Dr. Verdie MosherLiu PCP: Dwana MelenaZack Hall, MD  Patient coming from: [facility]  Chief Complaint:  Respiratory distress  HPI: George Cook is a 82 y.o. male with medical history significant of dementia, HTN, CAD, COPD, CVA with residual dysphagia, DM type II, and PE on chronic anticoagulation therapy; who presents from nursing facility with complaints of respiratory distress. History is obtained mostly from report as the patient has dementia and is in acute distress. Patient's daughter and daughter-in-law present at bedside. Patient was noted to have significant respiratory distress at the nursing facility acutely this afternoon. There was possible concern for the patient choking. Upon EMS arrival patient was noted to be tachypneic, hypertensive, and O2 saturations in 80's on room air. Per review of patient's previous notes it appears that his last echocardiogram was completed in 11/2014 showing a normal EF, and prior to that EF was noted to be 55-60% with grade 1 diastolic dysfunction in 07/2014.  ED Course: Patient was initially placed to nonrebreather mask by EMS. On arrival vitals pulse 73-101, respirations 24, blood pressure up to 211/114, and O2 saturations as low as 80%. ABG revealed pH of 7.237, PCO2 67.4, PCO2 80 on nonrebreather mask Patient was switched to BiPAP mask initially, but had an episode of vomiting and subsequently had to be switched back to nonrebreather mask. Chest x-ray revealed mild intervall CHF. Blood work revealed WBC 16.8, sodium 135, potassium 4.6, chloride 99, CO2 27, BUN 23, creatinine 0.9, glucose 261, BNP 340, and lactic acid 4.77. Patient was given 40 mg of Lasix IV, 3 nitroglycerin tablets for BP, and empirically treated with vancomycin and Zosyn.   Review of Systems: Unable to obtain secondary to caveat of dementia and acute respiratory extremis  Past Medical History:  Diagnosis  Date  . Carotid artery occlusion    a. 05/2012 U/S Bilat ICA 20-39%.  Marland Kitchen. COPD (chronic obstructive pulmonary disease) (HCC)   . DM (diabetes mellitus) (HCC)   . Hiatal hernia   . HTN (hypertension)   . Macular degeneration   . Nephrolithiasis   . Obesity   . Pulmonary emboli (HCC)   . Stroke [facility](HCC)    a. 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 01/2009 TEE: EF 60-65%, mild MR, no LA/LAA thrombus.    Past Surgical History:  Procedure Laterality Date  . CATARACT EXTRACTION W/PHACO Right 10/02/2012   Procedure: CATARACT EXTRACTION PHACO AND INTRAOCULAR LENS PLACEMENT (IOC);  Surgeon: Gemma PayorKerry Hunt, MD;  Location: [facility] ORS;  Service: Ophthalmology;  Laterality: Right;  CDE 12.78  . CATARACT EXTRACTION W/PHACO Left 10/30/2012   Procedure: CATARACT EXTRACTION PHACO AND INTRAOCULAR LENS PLACEMENT (IOC);  Surgeon: Gemma PayorKerry Hunt, MD;  Location: [facility];  Service: Ophthalmology;  Laterality: Left;  CDE: 19.10  . EYE SURGERY    . FINGER SURGERY Left    pinky, after saw cut it off  . LEFT HEART CATHETERIZATION WITH CORONARY ANGIOGRAM Bilateral 03/08/2013   Procedure: LEFT HEART CATHETERIZATION WITH CORONARY ANGIOGRAM;  Surgeon: Laurey Moralealton S McLean, MD;  [facility];  Service: Cardiovascular;  Laterality: Bilateral;  . TEE WITHOUT CARDIOVERSION N/A 12/16/2014   Procedure: TRANSESOPHAGEAL ECHOCARDIOGRAM (TEE);  Surgeon: Antoine PocheJonathan F Branch, MD;  Location: [facility] ENDO SUITE;  Service: Cardiology;  Laterality: N/A;  will need 40 minutes to clean scope between procedures     reports that he quit smoking about 24 years ago. His smoking use included Cigarettes. He  started smoking about 66 years ago. He has a 120.00 pack-year smoking history. He has never used smokeless tobacco. He reports that he does not drink alcohol or use drugs.  Allergies  Allergen Reactions  . Aspirin Hives and Rash    Family History  Problem Relation Age of Onset  . Diabetes Mother   . Stroke Mother   . Emphysema Mother   . Hypertension  Mother   . Coronary artery disease Father   . Heart disease Father     Beefore age 8  . Stroke Sister   . Diabetes Sister   . Hypertension Sister   . Coronary artery disease Brother   . Heart disease Brother     Before age 7- CABG    Prior to Admission medications   Medication Sig Start Date End Date Taking? Authorizing Provider  albuterol (PROVENTIL) (2.5 MG/3ML) 0.083% nebulizer solution 1 inhalation four times a day 8 am, 12  Pm, 4 pm, 9 pm for wheezing and SOB    Historical Provider, MD  apixaban (ELIQUIS) 5 MG TABS tablet Take 5 mg by mouth 2 (two) times daily.    Historical Provider, MD  Ascorbic Acid (VITAMIN C) 1000 MG tablet Take 1,500 mg by mouth daily.     Historical Provider, MD  atorvastatin (LIPITOR) 10 MG tablet Take 10 mg by mouth daily.    Historical Provider, MD  carvedilol (COREG) 3.125 MG tablet TAKE 1 TABLET BY MOUTH TWICE DAILY WITH MEALS. 10/25/14   Laqueta Linden, MD  Cholecalciferol (VITAMIN D) 2000 UNITS CAPS Take 1 capsule by mouth daily after supper.    Historical Provider, MD  Cyanocobalamin (B-12) 1000 MCG TBCR Take 1 tablet by mouth 2 (two) times daily.    Historical Provider, MD  donepezil (ARICEPT) 10 MG tablet Take 10 mg by mouth daily after supper.     Historical Provider, MD  ENSURE (ENSURE) Take one by mouth once daily    Historical Provider, MD  escitalopram (LEXAPRO) 5 MG tablet Take 5 mg by mouth every evening.    Historical Provider, MD  furosemide (LASIX) 20 MG tablet Take 20 mg by mouth daily.    Historical Provider, MD  lisinopril (PRINIVIL,ZESTRIL) 5 MG tablet Take 1 tablet (5 mg total) by mouth daily. 03/13/13   Antoine Poche, MD  LORazepam (ATIVAN) 0.5 MG tablet Take 0.25 mg by mouth 2 (two) times daily.     Historical Provider, MD  metFORMIN (GLUCOPHAGE) 500 MG tablet Take 500 mg by mouth every evening. Along with 250 mg to equal 750 mg    Historical Provider, MD  metFORMIN (GLUCOPHAGE) 500 MG tablet Take 250 mg by mouth daily. Take  along with 500 mg to equal 750 mg    Historical Provider, MD  Multiple Vitamin (MULTIVITAMIN WITH MINERALS) TABS tablet Take 1 tablet by mouth daily.    Historical Provider, MD  nitroGLYCERIN (NITROSTAT) 0.4 MG SL tablet Place 1 tablet (0.4 mg total) under the tongue every 5 (five) minutes as needed for chest pain. 03/13/13   Antoine Poche, MD  OXYGEN 2L/ mn via N/C prn to maintain stas >90 % Every shift    Historical Provider, MD  Skin Protectants, Misc. (ALOE VESTA PROTECTIVE) OINT Apply to sacrum and bilateral buttock three times daily    Historical Provider, MD  tamsulosin (FLOMAX) 0.4 MG CAPS capsule Take 0.4 mg by mouth at bedtime.    Historical Provider, MD  traMADol (ULTRAM) 50 MG tablet Take 1 tablet (50  mg total) by mouth 3 (three) times daily as needed for moderate pain. For pain 09/11/15   Kermit Baloiffany L Reed, DO    Physical Exam: Constitutional:  Elderly male in moderate respiratory distress able to follow simple commands Vitals:   03/25/16 1930 03/25/16 1945 03/25/16 2000 03/25/16 2030  BP: 183/94 145/63 134/62 97/56  Pulse: 77 77 77 73  Resp:      SpO2: (!) 80% 93% 94% 92%  Weight:      Height:       Eyes: PERRL, lids and conjunctivae normal ENMT: Mucous membranes are dry. Posterior pharynx clear of any exudate or lesions.Normal dentition.  Neck: normal, supple, no masses, no thyromegaly Respiratory: Tachypneic with bilateral rhonchi and crackles appreciated. / rubs / gallops. No extremity edema. 2+ pedal pulses. No carotid bruits.  Abdomen: no tenderness, no masses palpated. No hepatosplenomegaly. Bowel sounds positive.  Musculoskeletal: no clubbing / cyanosis. No joint deformity upper and lower extremities. Good ROM, no contractures. Normal muscle tone.  Skin: Bruising/ecchymoses on the left arm noted. Specific skin lesions of the bilateral lower extremities. Neurologic: CN 2-12 grossly intact. Sensation intact, DTR normal. Strength 5/5 in all 4.  Psychiatric: Lethargic,  but arousable to verbal stimuli.   Labs on Admission: I have personally reviewed following labs and imaging studies  CBC:  Recent Labs Lab 03/25/16 1856  WBC 16.8*  NEUTROABS 11.3*  HGB 13.6  HCT 41.6  MCV 87.8  PLT 271   Basic Metabolic Panel:  Recent Labs Lab 03/25/16 1856  NA 135  K 4.6  CL 99*  CO2 27  GLUCOSE 261*  BUN 23*  CREATININE 0.99  CALCIUM 8.7*   GFR: Estimated Creatinine Clearance: 57 mL/min (by C-G formula based on SCr of 0.99 mg/dL). Liver Function Tests:  Recent Labs Lab 03/25/16 1856  AST 36  ALT 16*  ALKPHOS 54  BILITOT 0.7  PROT 6.8  ALBUMIN 3.5   No results for input(s): LIPASE, AMYLASE in the last 168 hours. No results for input(s): AMMONIA in the last 168 hours. Coagulation Profile: No results for input(s): INR, PROTIME in the last 168 hours. Cardiac Enzymes: No results for input(s): CKTOTAL, CKMB, CKMBINDEX, TROPONINI in the last 168 hours. BNP (last 3 results) No results for input(s): PROBNP in the last 8760 hours. HbA1C: No results for input(s): HGBA1C in the last 72 hours. CBG: No results for input(s): GLUCAP in the last 168 hours. Lipid Profile: No results for input(s): CHOL, HDL, LDLCALC, TRIG, CHOLHDL, LDLDIRECT in the last 72 hours. Thyroid Function Tests: No results for input(s): TSH, T4TOTAL, FREET4, T3FREE, THYROIDAB in the last 72 hours. Anemia Panel: No results for input(s): VITAMINB12, FOLATE, FERRITIN, TIBC, IRON, RETICCTPCT in the last 72 hours. Urine analysis:    Component Value Date/Time   COLORURINE YELLOW 02/25/2016 1433   APPEARANCEUR CLEAR 02/25/2016 1433   LABSPEC 1.015 02/25/2016 1433   PHURINE 6.0 02/25/2016 1433   GLUCOSEU NEGATIVE 02/25/2016 1433   HGBUR TRACE (A) 02/25/2016 1433   BILIRUBINUR NEGATIVE 02/25/2016 1433   KETONESUR TRACE (A) 02/25/2016 1433   PROTEINUR NEGATIVE 02/25/2016 1433   UROBILINOGEN 0.2 03/24/2015 0030   NITRITE NEGATIVE 02/25/2016 1433   LEUKOCYTESUR NEGATIVE  02/25/2016 1433   Sepsis Labs: No results found for this or any previous visit (from the past 240 hour(s)).   Radiological Exams on Admission: Dg Chest Portable 1 View  Result Date: 03/25/2016 CLINICAL DATA:  Sudden onset shortness of breath and hypoxia. Dyspnea. EXAM: PORTABLE CHEST 1 VIEW COMPARISON:  05/09/2015. FINDINGS: Interval mild enlargement of the cardiac silhouette with increased prominence of the pulmonary vasculature and interstitial markings. No pleural fluid. Unremarkable bones. IMPRESSION: Interval mild cardiomegaly and mild changes of acute congestive heart failure. Electronically Signed   By: Beckie Salts M.D.   On: 03/25/2016 19:15    EKG: Independently reviewed.Bifascicular block with ST segment changes suggestive of an MI.  Assessment/Plan Acute respiratory failure with hypoxia and hypercapnia: Patient with respiratory distress noted this afternoon attack cause is not clearly known. On differential includes CHF as pulmonary edema seen on chest x-ray with also a possibility of aspiration noted. - Admit to stepdown - Continuous pulse oximetry - Continue nonrebreather and wean as tolerated - Duonebs q4hrs  Pulmonary edema/Suspect diastolic CHF exacerbation: Acute.  - Strict ins and outs  - Place Foley catheter   - Lasix 40 mg IV every 12 hours as tolerated - Replace electrolytes as needed  SIRS/ sepsis, unknown source: Acute. Patient found to have elevated lactic acid of 4.77, tachycardia and tachypnea. Question of patient possibly aspirated. - Follow-up blood cultures - Continue empiric antibiotics of vancomycin Zosyn  Hypotension/hypertensive urgency: After patient was noted to be given 3 nitroglycerin tablets and lasix systolic blood pressures dropped into the 90s. - Discontinue nitroglycerin drip   Dysphagia/question of possible aspiration - Consult speech to perform formal evaluation in a.m. - Recheck 2 view chest x-ray in a.m. - Antibiotics as seen  above  Lactic acidosis: Acute lactic acid level 4.6 on admission  -  trend lactic acid levels   Abnormal EKG: with bifascicular block initial troponin negative at 0.01. -Trend cardiac troponins   Dementia   Nausea/vomiting - Zofran Prn   History of pulmonary embolism /DVT on chronic anticoagulation: Patient was on Eliquis  - Change patient to Lovenox as he is NPO currently   Diabetes mellitus type 2. Blood glucose elevated at 231 on admission. - Hypoglycemic protocols  - Sensitive sliding scale insulin for now every 4 hours   DVT prophylaxis:   Lovenox  Code Status: DNR  Family Communication: Discuss patient's critical status at this time with daughter and daughter-in-law present at bedside  Disposition Plan: TBD  Consults called: None  Admission status: Inpatient stepdown  Clydie Braun MD [facility] Pager [phone]  If 7PM-7AM, please contact night-coverage [email or web address] Password [email or web address]  03/25/2016, 8:40 PM

## 2016-03-25 NOTE — Progress Notes (Signed)
RT called to room to check on patient. Patient was actively vomiting into BIPAP mask. Patient taken off BIPAP and placed on Non Rebreather mask. O2 sats 91-96%. RT NT suctioned patient and got a small amount of white tan thick out of right nare. ABG obtained and results called back to Clarene Reamerasey Vick, Charity fundraiserN. It is contraindicated to place patient back on BIPAP while he is actively vomiting and aspirating.

## 2016-03-25 NOTE — Progress Notes (Signed)
ANTICOAGULATION CONSULT NOTE  Pharmacy Consult for Lovenox Indication: Hx pulmonary embolus  Allergies  Allergen Reactions  . Aspirin Hives and Rash    Patient Measurements: Height: 5\' 11"  (180.3 cm) Weight: 180 lb (81.6 kg) IBW/kg (Calculated) : 75.3 HEPARIN DW (KG): 81.6  Vital Signs: BP: 94/72 (11/02 2130) Pulse Rate: 69 (11/02 2130)  Labs:  Recent Labs  03/25/16 1856  HGB 13.6  HCT 41.6  PLT 271  CREATININE 0.99    Estimated Creatinine Clearance: 57 mL/min (by C-G formula based on SCr of 0.99 mg/dL).   Medical History: Past Medical History:  Diagnosis Date  . Carotid artery occlusion    a. 05/2012 U/S Bilat ICA 20-39%.  Marland Kitchen. COPD (chronic obstructive pulmonary disease) (HCC)   . DM (diabetes mellitus) (HCC)   . Hiatal hernia   . HTN (hypertension)   . Macular degeneration   . Nephrolithiasis   . Obesity   . Pulmonary emboli (HCC)   . Stroke [facility](HCC)    a. 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 01/2009 TEE: EF 60-65%, mild MR, no LA/LAA thrombus.   Medications:  Prescriptions Prior to Admission  Medication Sig Dispense Refill Last Dose  . albuterol (PROVENTIL) (2.5 MG/3ML) 0.083% nebulizer solution 1 inhalation four times a day 8 am, 12  Pm, 4 pm, 9 pm for wheezing and SOB   Taking  . apixaban (ELIQUIS) 5 MG TABS tablet Take 5 mg by mouth 2 (two) times daily.   Taking  . Ascorbic Acid (VITAMIN C) 1000 MG tablet Take 1,500 mg by mouth daily.    Taking  . atorvastatin (LIPITOR) 10 MG tablet Take 10 mg by mouth daily.   Taking  . carvedilol (COREG) 3.125 MG tablet TAKE 1 TABLET BY MOUTH TWICE DAILY WITH MEALS. 180 tablet 3 Taking  . Cholecalciferol (VITAMIN D) 2000 UNITS CAPS Take 1 capsule by mouth daily after supper.   Taking  . Cyanocobalamin (B-12) 1000 MCG TBCR Take 1 tablet by mouth 2 (two) times daily.   Taking  . donepezil (ARICEPT) 10 MG tablet Take 10 mg by mouth daily after supper.    Taking  . ENSURE (ENSURE) Take one by mouth once daily   Taking  .  escitalopram (LEXAPRO) 5 MG tablet Take 5 mg by mouth every evening.   Taking  . furosemide (LASIX) 20 MG tablet Take 20 mg by mouth daily.   Taking  . lisinopril (PRINIVIL,ZESTRIL) 5 MG tablet Take 1 tablet (5 mg total) by mouth daily. 30 tablet 3 Taking  . LORazepam (ATIVAN) 0.5 MG tablet Take 0.25 mg by mouth 2 (two) times daily.    Taking  . metFORMIN (GLUCOPHAGE) 500 MG tablet Take 500 mg by mouth every evening. Along with 250 mg to equal 750 mg   Taking  . metFORMIN (GLUCOPHAGE) 500 MG tablet Take 250 mg by mouth daily. Take along with 500 mg to equal 750 mg   Taking  . Multiple Vitamin (MULTIVITAMIN WITH MINERALS) TABS tablet Take 1 tablet by mouth daily.   Taking  . nitroGLYCERIN (NITROSTAT) 0.4 MG SL tablet Place 1 tablet (0.4 mg total) under the tongue every 5 (five) minutes as needed for chest pain. 25 tablet 3 Taking  . OXYGEN 2L/ mn via N/C prn to maintain stas >90 % Every shift   Taking  . Skin Protectants, Misc. (ALOE VESTA PROTECTIVE) OINT Apply to sacrum and bilateral buttock three times daily   Taking  . tamsulosin (FLOMAX) 0.4 MG CAPS capsule Take 0.4  mg by mouth at bedtime.   Taking  . traMADol (ULTRAM) 50 MG tablet Take 1 tablet (50 mg total) by mouth 3 (three) times daily as needed for moderate pain. For pain 20 tablet 0 Taking    Assessment: Okay for Protocol. NPO per MD note, transition to SQ Lovenox for Hx PE.  [facility] Ap[facility], discussed w/ RN.  No bleeding noted.  Goal of Therapy:  Anti-Xa level 0.6-1 units/ml 4hrs after LMWH dose given if clinically indicated. Monitor platelets by anticoagulation protocol: Yes   Plan:  Lovenox 80mg  SQ every 12 hours. Monitor for signs and symptoms of bleeding.  CBC Daily.  Lamonte RicherHayes, Gail Creekmore R 03/25/2016,10:15 PM

## 2016-03-25 NOTE — ED Notes (Signed)
Placed on bipap via respiratory

## 2016-03-25 NOTE — ED Notes (Signed)
CRITICAL VALUE ALERT  Critical value received: pH 7.23, pCO2 67.4, pO2 80.1, Sat 92%, bicarb 23.5  Date of notification:  03/25/2016  Time of notification:  1956  Critical value read back:Yes.    Nurse who received alert:  Clarene Reamerasey Darian Cansler, RN  MD notified (1st page):  Liu  Time of first page:  2000

## 2016-03-25 NOTE — ED Notes (Signed)
Vomited, bipap removed

## 2016-03-25 NOTE — Progress Notes (Signed)
Trop of 0.06 called to MD and made aware.

## 2016-03-25 NOTE — ED Triage Notes (Signed)
Pt comes by EMS from [facility]. Pt was last seen normal at dinner. Pt was placed in bed after dinner and found with increased work of breathing and gurgling. MD at bedside. Pt alert at this time. Oxygen 82% on 15L NRB

## 2016-03-25 NOTE — ED Provider Notes (Signed)
[facility]-EMERGENCY DEPT Provider Note   CSN: [id number] Arrival date & time: 03/25/16  4098     History   Chief Complaint Chief Complaint  Patient presents with  . Shortness of Breath    HPI George Cook is a 82 y.o. male.  HPI Level V caveat due to acuity of condition. Presents from [facility] for respiratory distress. History of dementia, COPD, CAD, CVA w/ dysphagia, and PE.  Per EMS, patient ate meal today, and later found in room with respiratory distress. Hypoxic 80s on room air, tachycardia, and hypertensive for EMS. Patients denies chest pain, abdominal pain. Thinks he may have choked on his food.   Past Medical History:  Diagnosis Date  . Carotid artery occlusion    a. 05/2012 U/S Bilat ICA 20-39%.  Marland Kitchen COPD (chronic obstructive pulmonary disease) (HCC)   . DM (diabetes mellitus) (HCC)   . Hiatal hernia   . HTN (hypertension)   . Macular degeneration   . Nephrolithiasis   . Obesity   . Pulmonary emboli (HCC)   . Stroke [facility])    a. 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 01/2009 TEE: EF 60-65%, mild MR, no LA/LAA thrombus.    Patient Active Problem List   Diagnosis Date Noted  . Pulmonary edema 03/25/2016  . Exertional dyspnea 11/10/2015  . Edema 05/09/2015  . CAP (community acquired pneumonia) 03/23/2015  . COPD (chronic obstructive pulmonary disease) (HCC) 03/23/2015  . Acute encephalopathy 03/23/2015  . CVA (cerebral infarction) 12/14/2014  . Type 2 diabetes mellitus (HCC) 03/11/2013  . CAD (coronary artery disease) 03/11/2013  . Moderate aortic stenosis 03/11/2013  . Right leg DVT (HCC) 03/11/2013  . Pulmonary embolism, bilateral (HCC) 03/09/2013  . Essential hypertension 03/07/2013  . Occlusion and stenosis of carotid artery without mention of cerebral infarction 06/07/2012  . FULL INCONTINENCE OF FECES 04/03/2010  . HEMATOCHEZIA 06/20/2009    Past Surgical History:  Procedure Laterality Date  . CATARACT EXTRACTION W/PHACO Right 10/02/2012   Procedure: CATARACT EXTRACTION PHACO AND INTRAOCULAR LENS PLACEMENT (IOC);  Surgeon: Gemma Payor, MD;  Location: [facility] ORS;  Service: Ophthalmology;  Laterality: Right;  CDE 12.78  . CATARACT EXTRACTION W/PHACO Left 10/30/2012   Procedure: CATARACT EXTRACTION PHACO AND INTRAOCULAR LENS PLACEMENT (IOC);  Surgeon: Gemma Payor, MD;  Location: [facility] ORS;  Service: Ophthalmology;  Laterality: Left;  CDE: 19.10  . EYE SURGERY    . FINGER SURGERY Left    pinky, after saw cut it off  . LEFT HEART CATHETERIZATION WITH CORONARY ANGIOGRAM Bilateral 03/08/2013   Procedure: LEFT HEART CATHETERIZATION WITH CORONARY ANGIOGRAM;  Surgeon: Laurey Morale, MD;  Location: [facility] CATH LAB;  Service: Cardiovascular;  Laterality: Bilateral;  . TEE WITHOUT CARDIOVERSION N/A 12/16/2014   Procedure: TRANSESOPHAGEAL ECHOCARDIOGRAM (TEE);  Surgeon: Antoine Poche, MD;  Location: [facility] ENDO SUITE;  Service: Cardiology;  Laterality: N/A;  will need 40 minutes to clean scope between procedures       Home Medications    Prior to Admission medications   Medication Sig Start Date End Date Taking? Authorizing Provider  albuterol (PROVENTIL) (2.5 MG/3ML) 0.083% nebulizer solution 1 inhalation four times a day 8 am, 12  Pm, 4 pm, 9 pm for wheezing and SOB    Historical Provider, MD  apixaban (ELIQUIS) 5 MG TABS tablet Take 5 mg by mouth 2 (two) times daily.    Historical Provider, MD  Ascorbic Acid (VITAMIN C) 1000 MG tablet Take 1,500 mg by mouth daily.     Historical  Provider, MD  atorvastatin (LIPITOR) 10 MG tablet Take 10 mg by mouth daily.    Historical Provider, MD  carvedilol (COREG) 3.125 MG tablet TAKE 1 TABLET BY MOUTH TWICE DAILY WITH MEALS. 10/25/14   Laqueta Linden, MD  Cholecalciferol (VITAMIN D) 2000 UNITS CAPS Take 1 capsule by mouth daily after supper.    Historical Provider, MD  Cyanocobalamin (B-12) 1000 MCG TBCR Take 1 tablet by mouth 2 (two) times daily.    Historical Provider, MD  donepezil (ARICEPT) 10 MG tablet  Take 10 mg by mouth daily after supper.     Historical Provider, MD  ENSURE (ENSURE) Take one by mouth once daily    Historical Provider, MD  escitalopram (LEXAPRO) 5 MG tablet Take 5 mg by mouth every evening.    Historical Provider, MD  furosemide (LASIX) 20 MG tablet Take 20 mg by mouth daily.    Historical Provider, MD  lisinopril (PRINIVIL,ZESTRIL) 5 MG tablet Take 1 tablet (5 mg total) by mouth daily. 03/13/13   Antoine Poche, MD  LORazepam (ATIVAN) 0.5 MG tablet Take 0.25 mg by mouth 2 (two) times daily.     Historical Provider, MD  metFORMIN (GLUCOPHAGE) 500 MG tablet Take 500 mg by mouth every evening. Along with 250 mg to equal 750 mg    Historical Provider, MD  metFORMIN (GLUCOPHAGE) 500 MG tablet Take 250 mg by mouth daily. Take along with 500 mg to equal 750 mg    Historical Provider, MD  Multiple Vitamin (MULTIVITAMIN WITH MINERALS) TABS tablet Take 1 tablet by mouth daily.    Historical Provider, MD  nitroGLYCERIN (NITROSTAT) 0.4 MG SL tablet Place 1 tablet (0.4 mg total) under the tongue every 5 (five) minutes as needed for chest pain. 03/13/13   Antoine Poche, MD  OXYGEN 2L/ mn via N/C prn to maintain stas >90 % Every shift    Historical Provider, MD  Skin Protectants, Misc. (ALOE VESTA PROTECTIVE) OINT Apply to sacrum and bilateral buttock three times daily    Historical Provider, MD  tamsulosin (FLOMAX) 0.4 MG CAPS capsule Take 0.4 mg by mouth at bedtime.    Historical Provider, MD  traMADol (ULTRAM) 50 MG tablet Take 1 tablet (50 mg total) by mouth 3 (three) times daily as needed for moderate pain. For pain 09/11/15   Kermit Balo, DO    Family History Family History  Problem Relation Age of Onset  . Diabetes Mother   . Stroke Mother   . Emphysema Mother   . Hypertension Mother   . Coronary artery disease Father   . Heart disease Father     Beefore age 44  . Stroke Sister   . Diabetes Sister   . Hypertension Sister   . Coronary artery disease Brother   .  Heart disease Brother     Before age 105- CABG    Social History Social History  Substance Use Topics  . Smoking status: Former Smoker    Packs/day: 3.00    Years: 40.00    Types: Cigarettes    Start date: 05/24/1949    Quit date: 05/25/1991  . Smokeless tobacco: Never Used  . Alcohol use No     Allergies   Aspirin   Review of Systems Review of Systems  Unable to perform ROS: Acuity of condition     Physical Exam Updated Vital Signs BP (!) 90/41   Pulse 71   Resp 24   Ht 5\' 11"  (1.803 m)   Wt  180 lb (81.6 kg)   SpO2 96%   BMI 25.10 kg/m   Physical Exam Physical Exam  Nursing note and vitals reviewed. Constitutional: Listless, in respiratory distress, acutely ill appearing Head: Normocephalic and atraumatic.  Mouth/Throat: Oropharynx is clear and moist.  Neck: Normal range of motion. Neck supple.  Cardiovascular: Tachycardic rate and regular rhythm.   Pulmonary/Chest: Tachypnea, accessory muscle usage, rhonchi in all lung fields.  Abdominal: Soft. There is no tenderness. There is no rebound and no guarding.  Musculoskeletal: No deformities  Neurological: Listless, awakens to voice, answers simple questions appropriately, moves all extremities symmetrically Skin: Skin is warm and dry.  Psychiatric: Cooperative   ED Treatments / Results  Labs (all labs ordered are listed, but only abnormal results are displayed) Labs Reviewed  CBC WITH DIFFERENTIAL/PLATELET - Abnormal; Notable for the following:       Result Value   WBC 16.8 (*)    Neutro Abs 11.3 (*)    All other components within normal limits  COMPREHENSIVE METABOLIC PANEL - Abnormal; Notable for the following:    Chloride 99 (*)    Glucose, Bld 261 (*)    BUN 23 (*)    Calcium 8.7 (*)    ALT 16 (*)    All other components within normal limits  BRAIN NATRIURETIC PEPTIDE - Abnormal; Notable for the following:    B Natriuretic Peptide 340.0 (*)    All other components within normal limits  BLOOD GAS,  ARTERIAL - Abnormal; Notable for the following:    pH, Arterial 7.237 (*)    pCO2 arterial 67.4 (*)    pO2, Arterial 80.1 (*)    Allens test (pass/fail) POSITIVE (*)    All other components within normal limits  I-STAT CG4 LACTIC ACID, ED - Abnormal; Notable for the following:    Lactic Acid, Venous 4.77 (*)    All other components within normal limits  CULTURE, BLOOD (ROUTINE X 2)  CULTURE, BLOOD (ROUTINE X 2)  URINALYSIS, ROUTINE W REFLEX MICROSCOPIC (NOT AT [facility])  I-STAT TROPOININ, ED  I-STAT CG4 LACTIC ACID, ED    EKG  EKG Interpretation None       Radiology Dg Chest Portable 1 View  Result Date: 03/25/2016 CLINICAL DATA:  Sudden onset shortness of breath and hypoxia. Dyspnea. EXAM: PORTABLE CHEST 1 VIEW COMPARISON:  05/09/2015. FINDINGS: Interval mild enlargement of the cardiac silhouette with increased prominence of the pulmonary vasculature and interstitial markings. No pleural fluid. Unremarkable bones. IMPRESSION: Interval mild cardiomegaly and mild changes of acute congestive heart failure. Electronically Signed   By: Beckie SaltsSteven  Reid M.D.   On: 03/25/2016 19:15    Procedures Procedures (including critical care time) CRITICAL CARE Performed by: Lavera Guiseana Duo Liu   Total critical care time: 50 minutes  Critical care time was exclusive of separately billable procedures and treating other patients.  Critical care was necessary to treat or prevent imminent or life-threatening deterioration.  Critical care was time spent personally by me on the following activities: development of treatment plan with patient and/or surrogate as well as nursing, discussions with consultants, evaluation of patient's response to treatment, examination of patient, obtaining history from patient or surrogate, ordering and performing treatments and interventions, ordering and review of laboratory studies, ordering and review of radiographic studies, pulse oximetry and re-evaluation of patient's  condition.  Medications Ordered in ED Medications  nitroGLYCERIN 50 mg in dextrose 5 % 250 mL (0.2 mg/mL) infusion (not administered)  ipratropium-albuterol (DUONEB) 0.5-2.5 (3) MG/3ML nebulizer solution 3  mL (3 mLs Nebulization Given 03/25/16 1902)  vancomycin (VANCOCIN) IVPB 1000 mg/200 mL premix (0 mg Intravenous Stopped 03/25/16 2125)  piperacillin-tazobactam (ZOSYN) IVPB 3.375 g (0 g Intravenous Stopped 03/25/16 2125)  furosemide (LASIX) injection 40 mg (40 mg Intravenous Given 03/25/16 2025)  ondansetron (ZOFRAN) injection 4 mg (4 mg Intravenous Given 03/25/16 2026)     Initial Impression / Assessment and Plan / ED Course  I have reviewed the triage vital signs and the nursing notes.  Pertinent labs & imaging results that were available during my care of the patient were reviewed by me and considered in my medical decision making (see chart for details).  Clinical Course    Presenting with acute hypoxic and hypercapnic respiratory failure. This is likely in the setting of flash pulmonary edema and potentially aspiration pneumonia. Is hypertensive with systolic blood pressures in the 200s on presentation and hypoxic at 83% on a nonrebreather. With diffuse rhonchi on lung exam. CXR visualized, suggestive of pulmonary edema.  He was subsequently placed on BiPAP and given 3 SL nitroglycerins, with improved SpO2 93-95% and BP improvement to 140s/80s. EKG with bifascicular block. Did discuss with Dr. Anne FuSkains who did not feel patient's EKG consistent with that of STEMI. Does not think he is a good interventional candidate.  Also covered for potential aspiration pneumonia, given history of choking event. With leukocytosis of 16, lactate of 4 (more likely from hypoxia), but covered with vancomycin and zosyn.  During ED course, vomited on BiPAP. Thus, placed on NRB - sats 93%.   Discussed with Dr. Katrinka BlazingSmith from Hospitalist service. Will admit to ICU/stepdown.      Final Clinical Impressions(s) /  ED Diagnoses   Final diagnoses:  Acute pulmonary edema (HCC)  Acute respiratory failure with hypoxia and hypercapnia (HCC)  Aspiration pneumonia due to regurgitated food, unspecified laterality, unspecified part of lung [facility](HCC)    New Prescriptions New Prescriptions   No medications on file     Lavera Guiseana Duo Liu, MD 03/25/16 2139

## 2016-03-26 ENCOUNTER — Inpatient Hospital Stay (HOSPITAL_COMMUNITY): Payer: PPO

## 2016-03-26 ENCOUNTER — Encounter (HOSPITAL_COMMUNITY): Payer: Self-pay | Admitting: Primary Care

## 2016-03-26 DIAGNOSIS — Z515 Encounter for palliative care: Secondary | ICD-10-CM

## 2016-03-26 DIAGNOSIS — Z7189 Other specified counseling: Secondary | ICD-10-CM

## 2016-03-26 DIAGNOSIS — R1310 Dysphagia, unspecified: Secondary | ICD-10-CM | POA: Diagnosis not present

## 2016-03-26 DIAGNOSIS — R0602 Shortness of breath: Secondary | ICD-10-CM | POA: Diagnosis not present

## 2016-03-26 DIAGNOSIS — J690 Pneumonitis due to inhalation of food and vomit: Secondary | ICD-10-CM | POA: Diagnosis not present

## 2016-03-26 LAB — GLUCOSE, CAPILLARY
GLUCOSE-CAPILLARY: 71 mg/dL (ref 65–99)
GLUCOSE-CAPILLARY: 116 mg/dL — AB (ref 65–99)
GLUCOSE-CAPILLARY: 93 mg/dL (ref 65–99)
Glucose-Capillary: 97 mg/dL (ref 65–99)
Glucose-Capillary: 267 mg/dL — ABNORMAL HIGH (ref 65–99)

## 2016-03-26 LAB — BASIC METABOLIC PANEL
ANION GAP: 8 (ref 5–15)
BUN: 28 mg/dL — ABNORMAL HIGH (ref 6–20)
CALCIUM: 8.7 mg/dL — AB (ref 8.9–10.3)
CO2: 29 mmol/L (ref 22–32)
Chloride: 98 mmol/L — ABNORMAL LOW (ref 101–111)
Creatinine, Ser: 1.24 mg/dL (ref 0.61–1.24)
GFR, EST AFRICAN AMERICAN: 59 mL/min — AB (ref >60)
GFR, EST NON AFRICAN AMERICAN: 51 mL/min — AB (ref >60)
Glucose, Bld: 88 mg/dL (ref 65–99)
Potassium: 4.3 mmol/L (ref 3.5–5.1)
Sodium: 135 mmol/L (ref 135–145)

## 2016-03-26 LAB — CBC
HEMATOCRIT: 41.8 % (ref 39.0–52.0)
Hemoglobin: 13.8 g/dL (ref 13.0–17.0)
MCH: 28.2 pg (ref 26.0–34.0)
MCHC: 33 g/dL (ref 30.0–36.0)
MCV: 85.5 fL (ref 78.0–100.0)
PLATELETS: 231 10*3/uL (ref 150–400)
RBC: 4.89 MIL/uL (ref 4.22–5.81)
RDW: 13.5 % (ref 11.5–15.5)
WBC: 21.3 10*3/uL — ABNORMAL HIGH (ref 4.0–10.5)

## 2016-03-26 LAB — URINALYSIS, ROUTINE W REFLEX MICROSCOPIC
BILIRUBIN URINE: NEGATIVE
Glucose, UA: NEGATIVE mg/dL
KETONES UR: NEGATIVE mg/dL
NITRITE: NEGATIVE
Protein, ur: 30 mg/dL — AB
Specific Gravity, Urine: 1.025 (ref 1.005–1.030)
pH: 5 (ref 5.0–8.0)

## 2016-03-26 LAB — URINE MICROSCOPIC-ADD ON: Squamous Epithelial / LPF: NONE SEEN

## 2016-03-26 LAB — TROPONIN I
Troponin I: 0.09 ng/mL (ref <0.03)
Troponin I: 0.07 ng/mL (ref <0.03)

## 2016-03-26 LAB — MRSA PCR SCREENING: MRSA by PCR: NEGATIVE

## 2016-03-26 LAB — LACTIC ACID, PLASMA: LACTIC ACID, VENOUS: 2.9 mmol/L — AB (ref 0.5–1.9)

## 2016-03-26 MED ORDER — HALOPERIDOL LACTATE 5 MG/ML IJ SOLN
5.0000 mg | Freq: Four times a day (QID) | INTRAMUSCULAR | Status: DC | PRN
  Administered 2016-03-26: 5 mg via INTRAVENOUS
  Filled 2016-03-26 (×2): qty 1

## 2016-03-26 MED ORDER — SODIUM CHLORIDE 0.9 % IV BOLUS (SEPSIS)
500.0000 mL | Freq: Once | INTRAVENOUS | Status: AC
  Administered 2016-03-26: 500 mL via INTRAVENOUS

## 2016-03-26 MED ORDER — ACETAMINOPHEN 325 MG PO TABS: 650.0000 mg | ORAL_TABLET | Freq: Four times a day (QID) | ORAL | Status: DC | PRN

## 2016-03-26 MED ORDER — HALOPERIDOL LACTATE 5 MG/ML IJ SOLN
2.0000 mg | Freq: Four times a day (QID) | INTRAMUSCULAR | Status: DC | PRN
  Administered 2016-03-26: 2 mg via INTRAVENOUS
  Filled 2016-03-26: qty 1

## 2016-03-26 MED ORDER — ACETYLCYSTEINE 20 % IN SOLN
RESPIRATORY_TRACT | Status: AC
  Filled 2016-03-26: qty 4

## 2016-03-26 MED ORDER — ACETYLCYSTEINE 20 % IN SOLN
2.0000 mL | Freq: Once | RESPIRATORY_TRACT | Status: AC
  Administered 2016-03-26: 4 mL via RESPIRATORY_TRACT

## 2016-03-26 MED ORDER — FUROSEMIDE 10 MG/ML IJ SOLN
40.0000 mg | Freq: Every day | INTRAMUSCULAR | Status: DC
  Administered 2016-03-27 – 2016-03-28 (×2): 40 mg via INTRAVENOUS
  Filled 2016-03-26 (×2): qty 4

## 2016-03-26 MED ORDER — SODIUM CHLORIDE 0.9 % IV SOLN
INTRAVENOUS | Status: DC
  Administered 2016-03-26: 09:00:00 via INTRAVENOUS

## 2016-03-26 NOTE — Consult Note (Signed)
Consultation Note Date: 03/26/2016   Patient Name: George Cook  DOB: 1927/06/30  MRN: [id number]  Age / Sex: 82 y.o., male  PCP: Benita Stabile, MD Referring Physician: Leroy Sea, MD  Reason for Consultation: Establishing goals of care  HPI/Patient Profile: 82 y.o. male  with past medical history of Diabetes, hiatal hernia, hypertension, COPD, kidney stones, stroke, dementia, CVA with residual dysphasia, SNF/PNC resident X1 year admitted on 03/25/2016 with acute respiratory failure with hypoxia and hypercapnia likely related to aspiration pneumonia.   Clinical Assessment and Goals of Care: George Cook is resting quietly in bed, he speaks to me making and keeping eye contact. Present today are wife Tyler Aas, sons Barbara Cower and Elige Radon, and daughter Lupita Leash. We talk about George Cook chronic and acute illness.  We also talk about his functional status. We also talk about he chronic illness pathway related to dementia. We talk about dysphasia and special diets. We talk about strategies that allow [name] to enjoy his [facility] on Sunday afternoons. We also talk about the probability that George Cook will continue to aspirate, whether on food, liquids or his own oral secretions. We review labs and chest x-ray. We talk about healthcare power of attorney and advanced directives. Son Barbara Cower states that his father shared what he did and did not want in relation to end-of-life choices. Family seems at peace about his decline. We talk about the concepts of allow a natural death, do not treat the next infection, let nature take its course. In a later, private conversation, Barbara Cower states that he could accept to "not treat" the next time his father has aspiration pneumonia. He is tearful talking about what a wonderful father George Cook has been. 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   [name] has been a resident of [facility] for almost one year. PMT will continue to follow [name] at [facility].  Healthcare power of attorney HCPOA- sons Barbara Cower and Elige Radon share healthcare power of attorney.   SUMMARY OF RECOMMENDATIONS   Aspiration pneumonia: continue with antibiotics, pulmonary hygiene, supplemental oxygen. No severe breathlessness noted at this time. Dementia: likely entering end stages. Chronic illness pathway related to dementia shared with family.  Code Status/Advance Care Planning:  DNR - Barbara Cower shares that his father has talked about what he does and does not want at end-of-life. He shares that he does not want to have life-support, or tube feeding permanently. we discussed the concepts of allow a natural death, do not treat in the next infection. We talk about the likelihood that history more will continue to have aspiration pneumonias.   Symptom Management:   per hospitalist  Palliative Prophylaxis:   Aspiration and Turn Reposition  Additional Recommendations (Limitations, Scope, Preferences):  Continue to treat the treatable at this point, but no extraordinary measures such as CPR, intubation, or peg tube.  Psycho-social/Spiritual:   Desire for further Chaplaincy support:no  Additional Recommendations:  Caregiving  Support/Resources and Education on Hospice  Prognosis:   < 6 months, would not be surprising based on functional decline, history of aspiration/dysphasia.  Discharge Planning: Goal is to return to [facility] as resident. Palliative medicine will continue to follow George Cook at [facility]      Primary Diagnoses: Present on Admission: . Pulmonary edema . Acute respiratory failure (HCC) . Pulmonary embolism, bilateral (HCC) . Right leg DVT (HCC) . SIRS (systemic inflammatory response syndrome) (HCC) . Essential hypertension . Hypotension   I have reviewed the medical record, interviewed the patient and family, and examined  the patient. The following aspects are pertinent.  Past Medical History:  Diagnosis Date  . Carotid artery occlusion    a. 05/2012 U/S Bilat ICA 20-39%.  Marland Kitchen COPD (chronic obstructive pulmonary disease) (HCC)   . DM (diabetes mellitus) (HCC)   . Hiatal hernia   . HTN (hypertension)   . Macular degeneration   . Nephrolithiasis   . Obesity   . Pulmonary emboli (HCC)   . Stroke [facility])    a. 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 01/2009 TEE: EF 60-65%, mild MR, no LA/LAA thrombus.   Social History   Social History  . Marital status: Married    Spouse name: N/A  . Number of children: N/A  . Years of education: N/A   Social History Main Topics  . Smoking status: Former Smoker    Packs/day: 3.00    Years: 40.00    Types: Cigarettes    Start date: 05/24/1949    Quit date: 05/25/1991  . Smokeless tobacco: Never Used  . Alcohol use No  . Drug use: No  . Sexual activity: Not Asked   Other Topics Concern  . None   Social History Narrative   Lives in [place] with wife.  Sedentary.  Does not exercise.  Spends most of the day in his recliner.   Family History  Problem Relation Age of Onset  . Diabetes Mother   . Stroke Mother   . Emphysema Mother   . Hypertension Mother   . Coronary artery disease Father   . Heart disease Father     Beefore age 64  . Stroke Sister   . Diabetes Sister   . Hypertension Sister   . Coronary artery disease Brother   . Heart disease Brother     Before age 67- CABG   Scheduled Meds: . enoxaparin (LOVENOX) injection  1 mg/kg Subcutaneous Q12H  . [START ON 03/27/2016] furosemide  40 mg Intravenous Daily  . insulin aspart  0-9 Units Subcutaneous Q4H  . piperacillin-tazobactam (ZOSYN)  IV  3.375 g Intravenous Q8H  . vancomycin  750 mg Intravenous Q12H   Continuous Infusions: . sodium chloride 50 mL/hr at 03/26/16 0852   PRN Meds:.acetaminophen, haloperidol lactate, hydrALAZINE, ipratropium-albuterol, ipratropium-albuterol, ondansetron (ZOFRAN)  IV Medications Prior to Admission:  Prior to Admission medications   Medication Sig Start Date End Date Taking? Authorizing Provider  albuterol (PROVENTIL) (2.5 MG/3ML) 0.083% nebulizer solution 1 inhalation four times a day 8 am, 12  Pm, 4 pm, 9 pm for wheezing and SOB   Yes Historical Provider, MD  apixaban (ELIQUIS) 5 MG TABS tablet Take 5 mg by mouth 2 (two) times daily.   Yes Historical Provider, MD  Ascorbic Acid (VITAMIN C) 1000 MG tablet Take 1,500 mg by mouth daily.    Yes Historical Provider, MD  atorvastatin (LIPITOR) 10 MG tablet Take 10 mg by mouth daily.   Yes Historical Provider,  MD  carvedilol (COREG) 3.125 MG tablet TAKE 1 TABLET BY MOUTH TWICE DAILY WITH MEALS. 10/25/14  Yes Laqueta LindenSuresh A Koneswaran, MD  Cholecalciferol (VITAMIN D) 2000 UNITS CAPS Take 1 capsule by mouth daily after supper.   Yes Historical Provider, MD  Cyanocobalamin (B-12) 1000 MCG TBCR Take 1 tablet by mouth 2 (two) times daily.   Yes Historical Provider, MD  escitalopram (LEXAPRO) 5 MG tablet Take 5 mg by mouth every evening.   Yes Historical Provider, MD  furosemide (LASIX) 20 MG tablet Take 20 mg by mouth daily.   Yes Historical Provider, MD  lisinopril (PRINIVIL,ZESTRIL) 5 MG tablet Take 1 tablet (5 mg total) by mouth daily. 03/13/13  Yes Antoine PocheJonathan F Branch, MD  metFORMIN (GLUCOPHAGE) 500 MG tablet Take 500 mg by mouth every evening. Along with 250 mg to equal 750 mg   Yes Historical Provider, MD  metFORMIN (GLUCOPHAGE) 500 MG tablet Take 250 mg by mouth daily with breakfast. Take with 500mg  to equal 750mg    Yes Historical Provider, MD  Multiple Vitamin (MULTIVITAMIN WITH MINERALS) TABS tablet Take 1 tablet by mouth daily.   Yes Historical Provider, MD  donepezil (ARICEPT) 10 MG tablet Take 10 mg by mouth daily after supper.     Historical Provider, MD  LORazepam (ATIVAN) 0.5 MG tablet Take 0.25 mg by mouth 2 (two) times daily.     Historical Provider, MD  nitroGLYCERIN (NITROSTAT) 0.4 MG SL tablet Place 1 tablet  (0.4 mg total) under the tongue every 5 (five) minutes as needed for chest pain. 03/13/13   Antoine PocheJonathan F Branch, MD  tamsulosin (FLOMAX) 0.4 MG CAPS capsule Take 0.4 mg by mouth at bedtime.    Historical Provider, MD  traMADol (ULTRAM) 50 MG tablet Take 1 tablet (50 mg total) by mouth 3 (three) times daily as needed for moderate pain. For pain 09/11/15   Kermit Baloiffany L Reed, DO   Allergies  Allergen Reactions  . Aspirin Hives and Rash   Review of Systems  Unable to perform ROS: Dementia    Physical Exam  Constitutional: No distress.  Frail, chronically ill appearing  HENT:  Head: Normocephalic and atraumatic.  Cardiovascular: Normal rate.   Pulmonary/Chest: Effort normal. No respiratory distress.  Productive cough, course  Abdominal: Soft. He exhibits no distension.  Neurological: He is alert.  Skin: Skin is warm and dry.  Nursing note and vitals reviewed.   Vital Signs: BP 119/68 (BP Location: Right Arm)   Pulse 80   Temp 98.6 F (37 C) (Oral)   Resp 20   Ht 5\' 10"  (1.778 m)   Wt 83.4 kg (183 lb 13.8 oz)   SpO2 98%   BMI 26.38 kg/m  Pain Assessment: No/denies pain     SpO2: SpO2: 98 % O2 Device:SpO2: 98 % O2 Flow Rate: .O2 Flow Rate (L/min): 4 L/min  IO: Intake/output summary:  Intake/Output Summary (Last 24 hours) at 03/26/16 1343 Last data filed at 03/26/16 0500  Gross per 24 hour  Intake                0 ml  Output              350 ml  Net             -350 ml    LBM: Last BM Date: 03/24/16 Baseline Weight: Weight: 81.6 kg (180 lb) Most recent weight: Weight: 83.4 kg (183 lb 13.8 oz)     Palliative Assessment/Data:   Flowsheet Rows  Flowsheet Row Most Recent Value  Intake Tab  Referral Department  Hospitalist  Unit at Time of Referral  ICU  Palliative Care Primary Diagnosis  Pulmonary  Date Notified  03/26/16  Palliative Care Type  New Palliative care  Reason for referral  Clarify Goals of Care, Advance Care Planning  Date of Admission  03/25/16  Date  first seen by Palliative Care  03/26/16  # of days Palliative referral response time  0 Day(s)  # of days IP prior to Palliative referral  1  Clinical Assessment  Palliative Performance Scale Score  40%  Pain Max last 24 hours  Not able to report  Pain Min Last 24 hours  Not able to report  Dyspnea Max Last 24 Hours  Not able to report  Dyspnea Min Last 24 hours  Not able to report  Psychosocial & Spiritual Assessment  Palliative Care Outcomes  Patient/Family meeting held?  Yes  Who was at the meeting?  Wife Tyler AasDoris, sons Barbara CowerJason and Elige RadonBradley, daughter Lupita LeashDonna  Palliative Care Outcomes  Counseled regarding hospice, Provided advance care planning, Provided psychosocial or spiritual support, Clarified goals of care  Patient/Family wishes: Interventions discontinued/not started   Mechanical Ventilation, Tube feedings/TPN  Palliative Care follow-up planned  -- [[facility], continue to follow at [facility]]      Time In: 1050 Time Out: 1200 Time Total: 70 minutes Greater than 50%  of this time was spent counseling and coordinating care related to the above assessment and plan.  Signed by: Katheran Aweasha A Dove, NP   Please contact Palliative Medicine Team phone at [phone] for questions and concerns.  For individual provider: See Loretha StaplerAmion

## 2016-03-26 NOTE — Care Management Note (Signed)
Case Management Note  Patient Details  Name: George Cook MRN: [id number] Date of Birth: April 08, 1928  Subjective/Objective:                  Pt [facility] with resp failure. [facility] Anticipate pt will return to SNF. [facility].   Action/Plan: No CM needs anticipated.   Expected Discharge Date:    03/29/2016              Expected Discharge Plan:  Skilled Nursing Facility  In-House Referral:  Clinical Social Work  Discharge planning Services  CM Consult  Status of Service:  Completed, signed off  Malcolm MetroChildress, Aceton Kinnear Demske, RN 03/26/2016, 12:50 PM

## 2016-03-26 NOTE — Evaluation (Signed)
Clinical/Bedside Swallow Evaluation Patient Details  Name: George GrippeBilly L Imber MRN: [id number] Date of Birth: [birth year]  Today's Date: 03/26/2016 Time: SLP Start Time (ACUTE ONLY): 1600 SLP Stop Time (ACUTE ONLY): 1628 SLP Time Calculation (min) (ACUTE ONLY): 28 min  Past Medical History:  Past Medical History:  Diagnosis Date  . Carotid artery occlusion    a. 05/2012 U/S Bilat ICA 20-39%.  Marland Kitchen. COPD (chronic obstructive pulmonary disease) (HCC)   . DM (diabetes mellitus) (HCC)   . Hiatal hernia   . HTN (hypertension)   . Macular degeneration   . Nephrolithiasis   . Obesity   . Pulmonary emboli (HCC)   . Stroke [facility](HCC)    a. 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 01/2009 TEE: EF 60-65%, mild MR, no LA/LAA thrombus.   Past Surgical History:  Past Surgical History:  Procedure Laterality Date  . CATARACT EXTRACTION W/PHACO Right 10/02/2012   Procedure: CATARACT EXTRACTION PHACO AND INTRAOCULAR LENS PLACEMENT (IOC);  Surgeon: Gemma PayorKerry Hunt, MD;  Location: [facility] ORS;  Service: Ophthalmology;  Laterality: Right;  CDE 12.78  . CATARACT EXTRACTION W/PHACO Left 10/30/2012   Procedure: CATARACT EXTRACTION PHACO AND INTRAOCULAR LENS PLACEMENT (IOC);  Surgeon: Gemma PayorKerry Hunt, MD;  Location: [facility]S;  Service: Ophthalmology;  Laterality: Left;  CDE: 19.10  . EYE SURGERY    . FINGER SURGERY Left    pinky, after saw cut it off  . LEFT HEART CATHETERIZATION WITH CORONARY ANGIOGRAM Bilateral 03/08/2013   Procedure: LEFT HEART CATHETERIZATION WITH CORONARY ANGIOGRAM;  Surgeon: Laurey Moralealton S McLean, MD;  [facility];  Service: Cardiovascular;  Laterality: Bilateral;  . TEE WITHOUT CARDIOVERSION N/A 12/16/2014   Procedure: TRANSESOPHAGEAL ECHOCARDIOGRAM (TEE);  Surgeon: Antoine PocheJonathan F Branch, MD;  Location: [facility] ENDO SUITE;  Service: Cardiology;  Laterality: N/A;  will need 40 minutes to clean scope between procedures   HPI:  Pt is an 82 y.o.malewith past medical history of Diabetes, hiatal hernia, hypertension, COPD,  kidney stones, stroke, dementia, CVA with residual dysphasia, SNF/PNC resident X1yearadmitted on 11/2/2017with acute respiratory failure with hypoxia and hypercapnia likely related to aspiration pneumonia.   Assessment / Plan / Recommendation Clinical Impression  SLP familiar to this patient from prior swallow intervention at SNF. Pt with hx of chronic oropharyngeal dysphagia  and hx of aspiration PNA, previously on thickened liquids (nectar thick)  and dysphagia 1 diet while at SNF. Significant to note that prior to acute hospitalization family was noncompliant with swallow precautions/diet recommendations and administered thin liquids despite education. Past objective swallow study, MBS 2016, also revealed aspiration of thin liquids with moderate oropharyngeal deficits.    This date pt with baseline congestion and wet vocal quality, clearing slightly with cough and volitional swallow. Pt exhibits s/sx of suspected reduced airway protection following ice chips and nectar thick consistencies including wet vocal quality and immediate and delayed coughing.  Honey thick liquid and puree consistencies without overt signs of aspiration. Pt continues to be at signficant risk for aspiration given hx of dysphagia with recurrent aspiration PNA, reduced respiratory status, cognitive impairments, and immobility. Recommend conservative dysphagia 1 (puree) and honey thick liquid diet with medicines crushed in puree and full supervision. Family educated regarding diet recommendations and aspiration precautions. ST to continue to monitor.     Aspiration Risk  Severe aspiration risk    Diet Recommendation   Dysphagia 1 (puree) and Honey thick liquids   Medication Administration: Crushed with puree    Other  Recommendations Oral Care Recommendations: Oral care BID Other  Recommendations: Order thickener from pharmacy   Follow up Recommendations Skilled Nursing facility      Frequency and Duration min 2x/week   1 week       Prognosis Prognosis for Safe Diet Advancement: Guarded Barriers to Reach Goals: Cognitive deficits;Severity of deficits      Swallow Study   General Date of Onset: 03/25/16 HPI: Pt is an 82 y.o.malewith past medical history of Diabetes, hiatal hernia, hypertension, COPD, kidney stones, stroke, dementia, CVA with residual dysphasia, SNF/PNC resident X1yearadmitted on 11/2/2017with acute respiratory failure with hypoxia and hypercapnia likely related to aspiration pneumonia. Type of Study: Bedside Swallow Evaluation Previous Swallow Assessment: MBS 2016: nectar thick, puree and ground consistencies Diet Prior to this Study: NPO Temperature Spikes Noted: Yes Respiratory Status: Nasal cannula History of Recent Intubation: No Behavior/Cognition: Confused;Distractible;Alert Oral Cavity Assessment: Dry Oral Care Completed by SLP: Yes Oral Cavity - Dentition: Edentulous;Other (Comment) (upper dentures at baseline, not available ) Self-Feeding Abilities: Needs assist Patient Positioning: Upright in bed Baseline Vocal Quality: Wet;Breathy Volitional Cough: Congested    Oral/Motor/Sensory Function Overall Oral Motor/Sensory Function: Generalized oral weakness   Ice Chips Ice chips: Impaired Presentation: Spoon Oral Phase Impairments: Reduced lingual movement/coordination Oral Phase Functional Implications: Prolonged oral transit Pharyngeal Phase Impairments: Suspected delayed Swallow;Cough - Delayed;Multiple swallows;Decreased hyoid-laryngeal movement   Thin Liquid Thin Liquid: Not tested    Nectar Thick Nectar Thick Liquid: Impaired Presentation: Spoon Oral Phase Impairments: Reduced lingual movement/coordination Oral phase functional implications: Prolonged oral transit Pharyngeal Phase Impairments: Suspected delayed Swallow;Cough - Immediate;Multiple swallows   Honey Thick Honey Thick Liquid: Impaired Presentation: Cup Oral Phase Impairments: Reduced lingual  movement/coordination Oral Phase Functional Implications: Prolonged oral transit Pharyngeal Phase Impairments: Suspected delayed Swallow;Multiple swallows   Puree Puree: Impaired Oral Phase Impairments: Reduced lingual movement/coordination Oral Phase Functional Implications: Prolonged oral transit Pharyngeal Phase Impairments: Suspected delayed Swallow;Multiple swallows   Solid   GO   Solid: Not tested       Marcene Duoshelsea Sumney MA, CCC-SLP Acute Care Speech Language Pathologist    Marcene DuosSumney, Roshawn Ayala E 03/26/2016,4:51 PM

## 2016-03-26 NOTE — Care Management Important Message (Signed)
Important Message  Patient Details  Name: George GrippeBilly L Lueth MRN: [id number] Date of Birth: 25-Mar-1928   Medicare Important Message Given:  Yes    Malcolm MetroChildress, Alexis Reber Demske, RN 03/26/2016, 12:26 PM

## 2016-03-26 NOTE — Progress Notes (Signed)
PROGRESS NOTE                                                                                                                                                                                                             Patient Demographics:    George Cook, is a 82 y.o. male, DOB - [birth year], ZOX:[id number]  Admit date - 03/25/2016   Admitting Physician Clydie Braun, MD  Outpatient Primary MD for the patient is Dwana Melena, MD  LOS - 1  Chief Complaint  Patient presents with  . Shortness of Breath       Brief Narrative George Cook is a 82 y.o. male with medical history significant of dementia, HTN, CAD, COPD, CVA with residual dysphagia, DM type II, and PE on chronic anticoagulation therapy; who presents from nursing facility with complaints of respiratory distress, workup suggested sepsis due to aspiration pneumonia causing acute respiratory failure.   Subjective:    George Cook today has, No headache, No chest pain, No abdominal pain - No Nausea, No new weakness tingling or numbness, No Cough - SOB. Says no to all questions   Assessment  & Plan :     1.Acute hypoxic and hypercapnic respiratory failure due to aspiration pneumonia with sepsis. Overall prognosis is guarded, currently nothing by mouth, speech to evaluate, monitor cultures, continue to hydrate, currently hemodynamically stable and down to nasal cannula oxygen which will be continued, empiric IV antibiotics, oxygen and nebulizer treatments, respiratory therapy requested to provide patient with a dose of Mucomyst, bronchodilator, chest PT and suction. We'll try to increase activity and increased sitting in the chair.  2. History of DVT PE. Currently on Lovenox and transitioned to Eliquis once stable.  3. Dementia. At risk for delirium. Supportive care, minimize narcotics and benzodiazepines.  4. Active acute on chronic diastolic CHF and recent EF 55%. No  CHF per Kathlene November semination, monitor with supportive care, and he requiring hydration.  5. Mildly elevated troponin due to demand ischemia from #1 above, troponin trend on ACS and flat, chest pain-free, supportive care only for now. Currently not a candidate for invasive testing.  6. DM type II. Sliding scale for now.  Lab Results  Component Value Date   HGBA1C 8.0 (H) 01/06/2016   CBG (last 3)   Recent Labs  03/25/16 2356 03/26/16 0340 03/26/16  0746  GLUCAP 197* 71 97      Family Communication  :  None  Code Status :  DNR  Diet : NPO  Disposition Plan  :  Step down  Consults  :  Pall Care  Procedures  :    DVT Prophylaxis  :  Lovenox   Lab Results  Component Value Date   PLT 231 03/26/2016    Inpatient Medications  Scheduled Meds: . enoxaparin (LOVENOX) injection  1 mg/kg Subcutaneous Q12H  . [START ON 03/27/2016] furosemide  40 mg Intravenous Daily  . insulin aspart  0-9 Units Subcutaneous Q4H  . piperacillin-tazobactam (ZOSYN)  IV  3.375 g Intravenous Q8H  . vancomycin  750 mg Intravenous Q12H   Continuous Infusions: . sodium chloride 50 mL/hr at 03/26/16 0852   PRN Meds:.acetaminophen, hydrALAZINE, ipratropium-albuterol, ipratropium-albuterol, ondansetron (ZOFRAN) IV  Antibiotics  :    Anti-infectives    Start     Dose/Rate Route Frequency Ordered Stop   03/26/16 0900  vancomycin (VANCOCIN) IVPB 750 mg/150 ml premix     750 mg 150 mL/hr over 60 Minutes Intravenous Every 12 hours 03/25/16 2228     03/26/16 0500  piperacillin-tazobactam (ZOSYN) IVPB 3.375 g     3.375 g 12.5 mL/hr over 240 Minutes Intravenous Every 8 hours 03/25/16 2228     03/25/16 1945  vancomycin (VANCOCIN) IVPB 1000 mg/200 mL premix     1,000 mg 200 mL/hr over 60 Minutes Intravenous  Once 03/25/16 1930 03/25/16 2125   03/25/16 1945  piperacillin-tazobactam (ZOSYN) IVPB 3.375 g     3.375 g 100 mL/hr over 30 Minutes Intravenous  Once 03/25/16 1930 03/25/16 2125          Objective:   Vitals:   03/26/16 0923 03/26/16 0936 03/26/16 1000 03/26/16 1039  BP: 108/62 (!) 113/56 119/68   Pulse: 83 81 80   Resp: (!) 26 (!) 21 20   Temp:  100.3 F (37.9 C)    TempSrc:  Oral    SpO2: 97% 95% 95% 98%  Weight:      Height:        Wt Readings from Last 3 Encounters:  03/26/16 83.4 kg (183 lb 13.8 oz)  02/25/16 92.5 kg (204 lb)  12/25/15 87.1 kg (192 lb)     Intake/Output Summary (Last 24 hours) at 03/26/16 1056 Last data filed at 03/26/16 0500  Gross per 24 hour  Intake                0 ml  Output              350 ml  Net             -350 ml     Physical Exam  Awake, pleasantly confused, No new F.N deficits,   [place].AT,PERRAL Supple Neck,No JVD, No cervical lymphadenopathy appriciated.  Symmetrical Chest wall movement, Good air movement bilaterally, Coarse B sounds RRR,No Gallops,Rubs or new Murmurs, No Parasternal Heave +ve B.Sounds, Abd Soft, No tenderness, No organomegaly appriciated, No rebound - guarding or rigidity. No Cyanosis, Clubbing or edema, No new Rash or bruise      Data Review:    CBC  Recent Labs Lab 03/25/16 1856 03/26/16 0338  WBC 16.8* 21.3*  HGB 13.6 13.8  HCT 41.6 41.8  PLT 271 231  MCV 87.8 85.5  MCH 28.7 28.2  MCHC 32.7 33.0  RDW 13.3 13.5  LYMPHSABS 3.9  --   MONOABS 1.0  --   EOSABS  0.5  --   BASOSABS 0.1  --     Chemistries   Recent Labs Lab 03/25/16 1856 03/26/16 0338  NA 135 135  K 4.6 4.3  CL 99* 98*  CO2 27 29  GLUCOSE 261* 88  BUN 23* 28*  CREATININE 0.99 1.24  CALCIUM 8.7* 8.7*  AST 36  --   ALT 16*  --   ALKPHOS 54  --   BILITOT 0.7  --    ------------------------------------------------------------------------------------------------------------------ No results for input(s): CHOL, HDL, LDLCALC, TRIG, CHOLHDL, LDLDIRECT in the last 72 hours.  Lab Results  Component Value Date   HGBA1C 8.0 (H) 01/06/2016    ------------------------------------------------------------------------------------------------------------------ No results for input(s): TSH, T4TOTAL, T3FREE, THYROIDAB in the last 72 hours.  Invalid input(s): FREET3 ------------------------------------------------------------------------------------------------------------------ No results for input(s): VITAMINB12, FOLATE, FERRITIN, TIBC, IRON, RETICCTPCT in the last 72 hours.  Coagulation profile No results for input(s): INR, PROTIME in the last 168 hours.  No results for input(s): DDIMER in the last 72 hours.  Cardiac Enzymes  Recent Labs Lab 03/25/16 2153 03/26/16 0338 03/26/16 0937  TROPONINI 0.06* 0.09* 0.07*   ------------------------------------------------------------------------------------------------------------------    Component Value Date/Time   BNP 340.0 (H) 03/25/2016 1856    Micro Results Recent Results (from the past 240 hour(s))  Blood culture (routine x 2)     Status: None (Preliminary result)   Collection Time: 03/25/16  6:58 PM  Result Value Ref Range Status   Specimen Description BLOOD RIGHT ARM  Final   Special Requests BOTTLES DRAWN AEROBIC AND ANAEROBIC 6CC  Final   Culture NO GROWTH < 24 HOURS  Final   Report Status PENDING  Incomplete  Blood culture (routine x 2)     Status: None (Preliminary result)   Collection Time: 03/25/16  7:06 PM  Result Value Ref Range Status   Specimen Description BLOOD RIGHT HAND  Final   Special Requests BOTTLES DRAWN AEROBIC AND ANAEROBIC 6CC  Final   Culture NO GROWTH < 24 HOURS  Final   Report Status PENDING  Incomplete  MRSA PCR Screening     Status: None   Collection Time: 03/25/16 10:02 PM  Result Value Ref Range Status   MRSA by PCR NEGATIVE NEGATIVE Final    Comment:        The GeneXpert MRSA Assay (FDA approved for NASAL specimens only), is one component of a comprehensive MRSA colonization surveillance program. It is not intended to diagnose  MRSA infection nor to guide or monitor treatment for MRSA infections.     Radiology Reports Dg Chest Port 1 View  Result Date: 03/26/2016 CLINICAL DATA:  Shortness of Breath EXAM: PORTABLE CHEST 1 VIEW COMPARISON:  March 25, 2016 FINDINGS: There is new patchy airspace consolidation in the right base, consistent with focal pneumonia. There is mild atelectasis in the left base. Lungs elsewhere are clear. Heart is mildly enlarged with pulmonary vascularity within normal limits. No adenopathy. There is atherosclerotic calcification in the aorta. IMPRESSION: New airspace consolidation consistent with pneumonia right base. Mild left base atelectasis. Lungs elsewhere clear. Heart mildly enlarged, stable. Aortic atherosclerosis noted. Electronically Signed   By: Bretta BangWilliam  Woodruff III M.D.   On: 03/26/2016 07:42   Dg Chest Portable 1 View  Result Date: 03/25/2016 CLINICAL DATA:  Sudden onset shortness of breath and hypoxia. Dyspnea. EXAM: PORTABLE CHEST 1 VIEW COMPARISON:  05/09/2015. FINDINGS: Interval mild enlargement of the cardiac silhouette with increased prominence of the pulmonary vasculature and interstitial markings. No pleural fluid. Unremarkable bones. IMPRESSION: Interval  mild cardiomegaly and mild changes of acute congestive heart failure. Electronically Signed   By: Beckie SaltsSteven  Reid M.D.   On: 03/25/2016 19:15    Time Spent in minutes  30   Zaryia Markel K M.D on 03/26/2016 at 10:56 AM  Between 7am to 7pm - Pager - [phone]  After 7pm go to [email or web address] - password [email or web address]  [facility] -  Office  [phone]

## 2016-03-26 NOTE — Clinical Social Work Note (Signed)
Clinical Social Work Assessment  Patient Details  Name: George Cook MRN: [id number] Date of Birth: February 03, 1928  Date of referral:  03/26/16               Reason for consult:  Other (Comment Required), [facility]                Permission sought to share information with:    Permission granted to share information::     Name::        Agency::     Relationship::     Contact Information:     Housing/Transportation Living arrangements for the past 2 months:  Skilled Nursing Facility Source of Information:  Facility Patient Interpreter Needed:  None Criminal Activity/Legal Involvement Pertinent to Current Situation/Hospitalization:  No - Comment as needed Significant Relationships:  Adult Children, Spouse Lives with:  Facility Resident Do you feel safe going back to the place where you live?  Yes Need for family participation in patient care:  Yes (Comment)  Care giving concerns:  None identified. Facility resident.    Social Worker assessment / plan:  [facility]  Patient has been in the facility for a year on the assisted living side. Patient is in his wheelchair daily and moves around the facility via wheelchair. ADLs are assisted by staff.  Patient has good family support from his spouse and sons.  Patient can return to the facility at discharge.  Patient will only need an FL2 if he receives a PT eval that recommends PT.  CSW left a message for patient's wife to discuss discharge planning.   Employment status:  Retired Database administratornsurance information:  Managed Medicare PT Recommendations:  Not assessed at this time Information / Referral to community resources:     Patient/Family's Response to care: Message left for patient's spouse.   Patient/Family's Understanding of and Emotional Response to Diagnosis, Current Treatment, and Prognosis:  Message left for patient's spouse.   Emotional Assessment Appearance:  Appears stated  age Attitude/Demeanor/Rapport:  Unable to Assess Affect (typically observed):  Unable to Assess Orientation:  Oriented to Self Alcohol / Substance use:  Not Applicable Psych involvement (Current and /or in the community):  No (Comment)  Discharge Needs  Concerns to be addressed:  No discharge needs identified Readmission within the last 30 days:  No Current discharge risk:  None Barriers to Discharge:  No Barriers Identified   [name], [name], LCSW 03/26/2016, 12:03 PM

## 2016-03-27 ENCOUNTER — Inpatient Hospital Stay (HOSPITAL_COMMUNITY): Payer: PPO

## 2016-03-27 DIAGNOSIS — J690 Pneumonitis due to inhalation of food and vomit: Secondary | ICD-10-CM | POA: Diagnosis not present

## 2016-03-27 DIAGNOSIS — R1310 Dysphagia, unspecified: Secondary | ICD-10-CM | POA: Diagnosis not present

## 2016-03-27 DIAGNOSIS — I509 Heart failure, unspecified: Secondary | ICD-10-CM | POA: Diagnosis not present

## 2016-03-27 LAB — ECHOCARDIOGRAM COMPLETE
AOPV: 0.25 m/s
AOVTI: 75.6 cm
AV Area VTI index: 0.4 cm2/m2
AV Area VTI: 0.79 cm2
AV VEL mean LVOT/AV: 0.28
AV area mean vel ind: 0.43 cm2/m2
AVAREAMEANV: 0.88 cm2
AVG: 31 mmHg
AVPG: 62 mmHg
AVPKVEL: 394 cm/s
CHL CUP AV PEAK INDEX: 0.39
CHL CUP AV VALUE AREA INDEX: 0.4
CHL CUP AV VEL: 0.81
CHL CUP DOP CALC LVOT VTI: 19.6 cm
CHL CUP MV DEC (S): 271
DOP CAL AO MEAN VELOCITY: 254 cm/s
E/e' ratio: 15.89
EWDT: 271 ms
FS: 30 % (ref 28–44)
HEIGHTINCHES: 70 in
IVS/LV PW RATIO, ED: 1
LA vol index: 23.4 mL/m2
LADIAMINDEX: 1.57 cm/m2
LASIZE: 32 mm
LAVOL: 47.8 mL
LAVOLA4C: 43.9 mL
LEFT ATRIUM END SYS DIAM: 32 mm
LV E/e' medial: 15.89
LV E/e'average: 15.89
LV PW d: 10.1 mm — AB (ref 0.6–1.1)
LVELAT: 5.77 cm/s
LVOT area: 3.14 cm2
LVOT diameter: 20 mm
LVOT peak grad rest: 4 mmHg
LVOT peak vel: 99 cm/s
LVOTSV: 62 mL
LVOTVTI: 0.26 cm
Lateral S' vel: 13.2 cm/s
MV Peak grad: 3 mmHg
MV pk A vel: 122 m/s
MV pk E vel: 91.7 m/s
TAPSE: 17.3 mm
TDI e' lateral: 5.77
TDI e' medial: 6.09
Valve area: 0.81 cm2
WEIGHTICAEL: 2952.4 [oz_av]

## 2016-03-27 LAB — GLUCOSE, CAPILLARY
GLUCOSE-CAPILLARY: 155 mg/dL — AB (ref 65–99)
GLUCOSE-CAPILLARY: 149 mg/dL — AB (ref 65–99)
GLUCOSE-CAPILLARY: 229 mg/dL — AB (ref 65–99)
GLUCOSE-CAPILLARY: 244 mg/dL — AB (ref 65–99)
Glucose-Capillary: 126 mg/dL — ABNORMAL HIGH (ref 65–99)
Glucose-Capillary: 218 mg/dL — ABNORMAL HIGH (ref 65–99)

## 2016-03-27 LAB — CBC
HEMATOCRIT: 40.2 % (ref 39.0–52.0)
Hemoglobin: 13 g/dL (ref 13.0–17.0)
MCH: 28.2 pg (ref 26.0–34.0)
MCHC: 32.3 g/dL (ref 30.0–36.0)
MCV: 87.2 fL (ref 78.0–100.0)
Platelets: 200 10*3/uL (ref 150–400)
RBC: 4.61 MIL/uL (ref 4.22–5.81)
RDW: 13.6 % (ref 11.5–15.5)
WBC: 10.8 10*3/uL — ABNORMAL HIGH (ref 4.0–10.5)

## 2016-03-27 LAB — BASIC METABOLIC PANEL
ANION GAP: 9 (ref 5–15)
BUN: 25 mg/dL — ABNORMAL HIGH (ref 6–20)
CHLORIDE: 101 mmol/L (ref 101–111)
CO2: 28 mmol/L (ref 22–32)
Calcium: 8.6 mg/dL — ABNORMAL LOW (ref 8.9–10.3)
Creatinine, Ser: 0.93 mg/dL (ref 0.61–1.24)
GFR calc non Af Amer: >60 mL/min (ref >60)
Glucose, Bld: 168 mg/dL — ABNORMAL HIGH (ref 65–99)
POTASSIUM: 4.1 mmol/L (ref 3.5–5.1)
Sodium: 138 mmol/L (ref 135–145)

## 2016-03-27 MED ORDER — DONEPEZIL HCL 5 MG PO TABS
10.0000 mg | ORAL_TABLET | Freq: Every day | ORAL | Status: DC
  Administered 2016-03-27 – 2016-03-30 (×4): 10 mg via ORAL
  Filled 2016-03-27 (×5): qty 2

## 2016-03-27 MED ORDER — LORAZEPAM 0.5 MG PO TABS
0.2500 mg | ORAL_TABLET | Freq: Every day | ORAL | Status: DC
  Administered 2016-03-27: 0.25 mg via ORAL
  Filled 2016-03-27: qty 1

## 2016-03-27 MED ORDER — VITAMIN B-12 1000 MCG PO TABS
1000.0000 ug | ORAL_TABLET | Freq: Two times a day (BID) | ORAL | Status: DC
  Administered 2016-03-27 – 2016-03-31 (×9): 1000 ug via ORAL
  Filled 2016-03-27 (×9): qty 1

## 2016-03-27 MED ORDER — TAMSULOSIN HCL 0.4 MG PO CAPS
0.4000 mg | ORAL_CAPSULE | Freq: Every day | ORAL | Status: DC
  Administered 2016-03-27 – 2016-03-30 (×4): 0.4 mg via ORAL
  Filled 2016-03-27 (×4): qty 1

## 2016-03-27 MED ORDER — CARVEDILOL 3.125 MG PO TABS: 3.1250 mg | ORAL_TABLET | Freq: Two times a day (BID) | ORAL | Status: DC

## 2016-03-27 MED ORDER — INSULIN ASPART 100 UNIT/ML ~~LOC~~ SOLN
0.0000 [IU] | Freq: Three times a day (TID) | SUBCUTANEOUS | Status: DC
  Administered 2016-03-27: 1 [IU] via SUBCUTANEOUS
  Administered 2016-03-28: 2 [IU] via SUBCUTANEOUS
  Administered 2016-03-28: 5 [IU] via SUBCUTANEOUS
  Administered 2016-03-28: 2 [IU] via SUBCUTANEOUS
  Administered 2016-03-29: 5 [IU] via SUBCUTANEOUS
  Administered 2016-03-29: 2 [IU] via SUBCUTANEOUS
  Administered 2016-03-29: 3 [IU] via SUBCUTANEOUS
  Administered 2016-03-30: 2 [IU] via SUBCUTANEOUS
  Administered 2016-03-30: 7 [IU] via SUBCUTANEOUS
  Administered 2016-03-30: 5 [IU] via SUBCUTANEOUS
  Administered 2016-03-31: 2 [IU] via SUBCUTANEOUS
  Administered 2016-03-31: 7 [IU] via SUBCUTANEOUS

## 2016-03-27 MED ORDER — ADULT MULTIVITAMIN W/MINERALS CH
1.0000 | ORAL_TABLET | Freq: Every day | ORAL | Status: DC
  Administered 2016-03-27 – 2016-03-31 (×5): 1 via ORAL
  Filled 2016-03-27 (×5): qty 1

## 2016-03-27 MED ORDER — RESOURCE THICKENUP CLEAR PO POWD
ORAL | Status: DC | PRN
  Filled 2016-03-27: qty 125

## 2016-03-27 MED ORDER — ATORVASTATIN CALCIUM 10 MG PO TABS
10.0000 mg | ORAL_TABLET | Freq: Every day | ORAL | Status: DC
Start: 2016-03-27 — End: 2016-03-31
  Administered 2016-03-27 – 2016-03-30 (×4): 10 mg via ORAL
  Filled 2016-03-27 (×5): qty 1

## 2016-03-27 MED ORDER — NITROGLYCERIN 0.4 MG SL SUBL: 0.4000 mg | SUBLINGUAL_TABLET | SUBLINGUAL | Status: DC | PRN

## 2016-03-27 NOTE — Progress Notes (Signed)
Speech Language Pathology Treatment: Dysphagia  Patient Details Name: George Cook MRN: [id number] DOB: 09/08/1927 Today's Date: 03/27/2016 Time: [id number] SLP Time Calculation (min) (ACUTE ONLY): 24 min  Assessment / Plan / Recommendation Clinical Impression  Pt seen at bedside for ongoing diagnostic dysphagia intervention to assess for diet tolerance and upgrades as appropriate. Pt known to this SLP from previous MBSS completed almost 1 year ago (aspiration of thin liquids and placed on NTL). Pt was seen for clinical bedside evaluation yesterday and placed on D1/puree with HTL. Upon arrival to pt's room, pt was disrobed and stated he was "ready to go uptown". Gown repositioned and pt agreeable to po trials. He did not eat much of his puree breakfast tray (foods appeared very dry). Pt with mild wheeze and audible chest congestion prior to SLP trials. No appreciable difference between trials of HTL and NTL. Pt had difficulty with self feeding due to decreased hand control. He benefits from feeder assist to help control rate and amount of intake. Pt trialed with teaspoon, cup sip, and straw presentations of NTL. Will upgrade to NTL and continue D1/puree. Pt will need 1:1 feeder assist (relayed to RN, George Cook) and continue PO meds crushed as able in puree and follow with NTL to help clear. Pt continues to be at risk for aspiration given known dysphagia, COPD, and acute medical decline. SLP will follow. Pt expresses that he enjoys [place] and if his family brings this in, it can be thickened to NTL.   HPI HPI: Pt is an 82 y.o.malewith past medical history of Diabetes, hiatal hernia, hypertension, COPD, kidney stones, stroke, dementia, CVA with residual dysphasia, SNF/PNC resident X1yearadmitted on 11/2/2017with acute respiratory failure with hypoxia and hypercapnia likely related to aspiration pneumonia.      SLP Plan  Continue with current plan of care     Recommendations  Diet recommendations:  Dysphagia 1 (puree);Nectar-thick liquid Liquids provided via: Cup;Teaspoon;Straw Medication Administration: Crushed with puree Supervision: Full supervision/cueing for compensatory strategies;Staff to assist with self feeding Compensations: Slow rate;Small sips/bites;Clear throat intermittently Postural Changes and/or Swallow Maneuvers: Seated upright 90 degrees;Upright 30-60 min after meal                Oral Care Recommendations: Oral care BID Follow up Recommendations: Skilled Nursing facility Plan: Continue with current plan of care       Thank you,  George Cook, CCC-SLP [phone]                 George Cook 03/27/2016, 10:26 AM

## 2016-03-27 NOTE — Progress Notes (Signed)
ANTICOAGULATION CONSULT NOTE  Pharmacy Consult for Lovenox >> Eliquis Indication: Hx pulmonary embolus  Allergies  Allergen Reactions  . Aspirin Hives and Rash   Patient Measurements: Height: 5\' 10"  (177.8 cm) Weight: 184 lb 8.4 oz (83.7 kg) IBW/kg (Calculated) : 73 HEPARIN DW (KG): 83.8  Vital Signs: Temp: 98 F (36.7 C) (11/04 0800) Temp Source: Axillary (11/04 0800) BP: 167/91 (11/04 0800) Pulse Rate: 88 (11/04 0800)  Labs:  Recent Labs  03/25/16 1856 03/25/16 2153 03/26/16 0338 03/26/16 0937 03/27/16 0426  HGB 13.6  --  13.8  --  13.0  HCT 41.6  --  41.8  --  40.2  PLT 271  --  231  --  200  CREATININE 0.99  --  1.24  --  0.93  TROPONINI  --  0.06* 0.09* 0.07*  --     Estimated Creatinine Clearance: 58.9 mL/min (by C-G formula based on SCr of 0.93 mg/dL).   Medical History: Past Medical History:  Diagnosis Date  . Carotid artery occlusion    a. 05/2012 U/S Bilat ICA 20-39%.  Marland Kitchen. COPD (chronic obstructive pulmonary disease) (HCC)   . DM (diabetes mellitus) (HCC)   . Hiatal hernia   . HTN (hypertension)   . Macular degeneration   . Nephrolithiasis   . Obesity   . Pulmonary emboli (HCC)   . Stroke [facility](HCC)    a. 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 01/2009 TEE: EF 60-65%, mild MR, no LA/LAA thrombus.   Medications:  Prescriptions Prior to Admission  Medication Sig Dispense Refill Last Dose  . albuterol (PROVENTIL) (2.5 MG/3ML) 0.083% nebulizer solution 1 inhalation four times a day 8 am, 12  Pm, 4 pm, 9 pm for wheezing and SOB   03/25/2016 at Unknown time  . apixaban (ELIQUIS) 5 MG TABS tablet Take 5 mg by mouth 2 (two) times daily.   03/25/2016 at 0830  . Ascorbic Acid (VITAMIN C) 1000 MG tablet Take 1,500 mg by mouth daily.    03/25/2016 at Unknown time  . atorvastatin (LIPITOR) 10 MG tablet Take 10 mg by mouth daily.   03/25/2016 at Unknown time  . carvedilol (COREG) 3.125 MG tablet TAKE 1 TABLET BY MOUTH TWICE DAILY WITH MEALS. 180 tablet 3 03/25/2016 at  1700  . Cholecalciferol (VITAMIN D) 2000 UNITS CAPS Take 1 capsule by mouth daily after supper.   03/25/2016 at Unknown time  . Cyanocobalamin (B-12) 1000 MCG TBCR Take 1 tablet by mouth 2 (two) times daily.   03/25/2016 at Unknown time  . escitalopram (LEXAPRO) 5 MG tablet Take 5 mg by mouth every evening.   03/25/2016 at Unknown time  . furosemide (LASIX) 20 MG tablet Take 20 mg by mouth daily.   03/25/2016 at Unknown time  . lisinopril (PRINIVIL,ZESTRIL) 5 MG tablet Take 1 tablet (5 mg total) by mouth daily. 30 tablet 3 03/25/2016 at Unknown time  . metFORMIN (GLUCOPHAGE) 500 MG tablet Take 500 mg by mouth every evening. Along with 250 mg to equal 750 mg   03/25/2016 at Unknown time  . metFORMIN (GLUCOPHAGE) 500 MG tablet Take 250 mg by mouth daily with breakfast. Take with 500mg  to equal 750mg    03/25/2016 at Unknown time  . Multiple Vitamin (MULTIVITAMIN WITH MINERALS) TABS tablet Take 1 tablet by mouth daily.   03/25/2016 at Unknown time  . donepezil (ARICEPT) 10 MG tablet Take 10 mg by mouth daily after supper.    03/24/2016  . LORazepam (ATIVAN) 0.5 MG tablet Take 0.25 mg  by mouth 2 (two) times daily.    03/24/2016  . nitroGLYCERIN (NITROSTAT) 0.4 MG SL tablet Place 1 tablet (0.4 mg total) under the tongue every 5 (five) minutes as needed for chest pain. 25 tablet 3 unknown  . tamsulosin (FLOMAX) 0.4 MG CAPS capsule Take 0.4 mg by mouth at bedtime.   03/24/2016  . traMADol (ULTRAM) 50 MG tablet Take 1 tablet (50 mg total) by mouth 3 (three) times daily as needed for moderate pain. For pain 20 tablet 0 03/24/2016   Assessment: Has been on SQ Lovenox for Hx PE.  Last Lovenox dose was last night at 21:40pm.  Pt was on Apixaban PTA: dose = 5mg  BID which is appropriate. No bleeding noted.  Goal of Therapy:  Anticoagulation with Eliquis Monitor platelets by anticoagulation protocol: Yes   Plan:  Resume Eliquis 5mg  PO bid, 1st dose this morning Monitor for signs and symptoms of bleeding.   [name], Keneshia Tena  A 03/27/2016,9:17 AM

## 2016-03-27 NOTE — Progress Notes (Signed)
ECHO DONE 

## 2016-03-27 NOTE — Progress Notes (Addendum)
PROGRESS NOTE                                                                                                                                                                                                             Patient Demographics:    George Cook, is a 82 y.o. male, DOB - 09-Mar-1928, ZOX:[id number]  Admit date - 03/25/2016   Admitting Physician Clydie Braunondell A Smith, MD  Outpatient Primary MD for the patient is Dwana MelenaZack Hall, MD  LOS - 2  Chief Complaint  Patient presents with  . Shortness of Breath       Brief Narrative George Cook is a 82 y.o. male with medical history significant of dementia, HTN, CAD, COPD, CVA with residual dysphagia, DM type II, and PE on chronic anticoagulation therapy; who presents from nursing facility with complaints of respiratory distress, workup suggested sepsis due to aspiration pneumonia causing acute respiratory failure.   Subjective:    George Cook today has, No headache, No chest pain, No abdominal pain - No Nausea, No new weakness tingling or numbness, No Cough - SOB. Says no to all questions   Assessment  & Plan :     1.Acute hypoxic and hypercapnic respiratory failure due to aspiration pneumonia with sepsis. Overall prognosis is guarded, currently nothing by mouth, speech following on D1 HT diet, longterm prognosis is poor, Pall care following as well, continue monitor cultures, sepsis physiology has resolved, continue empiric IV antibiotics, oxygen and nebulizer treatments, respiratory therapy requested to provide chest PT - Flutter valve. We'll try to increase activity and increased sitting in the chair.  2. History of DVT PE. Switch back to liquids which he takes at home.  3. Dementia. At risk for delirium. Supportive care, minimize narcotics and benzodiazepines.  4. Active acute on chronic diastolic CHF and recent EF 55%. No CHF per Kathlene NovemberMike semination, monitor with supportive care,  and he requiring hydration. Pending repeat TTE.  5. Mildly elevated troponin due to demand ischemia from #1 above, troponin trend on ACS and flat, chest pain-free, supportive care only for now. Currently not a candidate for invasive testing. Coreg stopped as having episodes of Bradycardia.  6. DM type II. Sliding scale for now.  Lab Results  Component Value Date   HGBA1C 8.0 (H) 01/06/2016   CBG (last 3)   Recent Labs  03/27/16 0022 03/27/16 0514 03/27/16 0843  GLUCAP 218* 155* 149*      Family Communication  :  None  Code Status :  DNR  Diet : D1 - Honey Thick  Disposition Plan  :  SNF with palliative care in 1-2 days  Consults  :  Pall Care  Procedures  :    DVT Prophylaxis  :  Lovenox   Lab Results  Component Value Date   PLT 200 03/27/2016    Inpatient Medications  Scheduled Meds: . atorvastatin  10 mg Oral Daily  . B-12  1 tablet Oral BID  . carvedilol  3.125 mg Oral BID WC  . donepezil  10 mg Oral QPC supper  . furosemide  40 mg Intravenous Daily  . insulin aspart  0-9 Units Subcutaneous Q4H  . LORazepam  0.25 mg Oral QHS  . multivitamin with minerals  1 tablet Oral Daily  . piperacillin-tazobactam (ZOSYN)  IV  3.375 g Intravenous Q8H  . tamsulosin  0.4 mg Oral QHS  . vancomycin  750 mg Intravenous Q12H   Continuous Infusions:   PRN Meds:.acetaminophen, haloperidol lactate, hydrALAZINE, ipratropium-albuterol, ipratropium-albuterol, nitroGLYCERIN, ondansetron (ZOFRAN) IV  Antibiotics  :    Anti-infectives    Start     Dose/Rate Route Frequency Ordered Stop   03/26/16 0900  vancomycin (VANCOCIN) IVPB 750 mg/150 ml premix     750 mg 150 mL/hr over 60 Minutes Intravenous Every 12 hours 03/25/16 2228     03/26/16 0500  piperacillin-tazobactam (ZOSYN) IVPB 3.375 g     3.375 g 12.5 mL/hr over 240 Minutes Intravenous Every 8 hours 03/25/16 2228     03/25/16 1945  vancomycin (VANCOCIN) IVPB 1000 mg/200 mL premix     1,000 mg 200 mL/hr over 60  Minutes Intravenous  Once 03/25/16 1930 03/25/16 2125   03/25/16 1945  piperacillin-tazobactam (ZOSYN) IVPB 3.375 g     3.375 g 100 mL/hr over 30 Minutes Intravenous  Once 03/25/16 1930 03/25/16 2125         Objective:   Vitals:   03/26/16 2059 03/27/16 0000 03/27/16 0400 03/27/16 0800  BP:    (!) 167/91  Pulse:    88  Resp:    18  Temp:  (!) 100.6 F (38.1 C) (!) 96.7 F (35.9 C) 98 F (36.7 C)  TempSrc:  Axillary Oral Axillary  SpO2: 98%   97%  Weight:   83.5 kg (184 lb 1.4 oz) 83.7 kg (184 lb 8.4 oz)  Height:        Wt Readings from Last 3 Encounters:  03/27/16 83.7 kg (184 lb 8.4 oz)  02/25/16 92.5 kg (204 lb)  12/25/15 87.1 kg (192 lb)     Intake/Output Summary (Last 24 hours) at 03/27/16 0917 Last data filed at 03/27/16 0500  Gross per 24 hour  Intake          1446.67 ml  Output              900 ml  Net           546.67 ml     Physical Exam  Awake, pleasantly confused, No new F.N deficits,   [place].AT,PERRAL Supple Neck,No JVD, No cervical lymphadenopathy appriciated.  Symmetrical Chest wall movement, Good air movement bilaterally, Coarse B sounds RRR,No Gallops,Rubs or new Murmurs, No Parasternal Heave +ve B.Sounds, Abd Soft, No tenderness, No organomegaly appriciated, No rebound - guarding or rigidity. No Cyanosis, Clubbing or edema, No new Rash or bruise  Data Review:    CBC  Recent Labs Lab 03/25/16 1856 03/26/16 0338 03/27/16 0426  WBC 16.8* 21.3* 10.8*  HGB 13.6 13.8 13.0  HCT 41.6 41.8 40.2  PLT 271 231 200  MCV 87.8 85.5 87.2  MCH 28.7 28.2 28.2  MCHC 32.7 33.0 32.3  RDW 13.3 13.5 13.6  LYMPHSABS 3.9  --   --   MONOABS 1.0  --   --   EOSABS 0.5  --   --   BASOSABS 0.1  --   --     Chemistries   Recent Labs Lab 03/25/16 1856 03/26/16 0338 03/27/16 0426  NA 135 135 138  K 4.6 4.3 4.1  CL 99* 98* 101  CO2 27 29 28   GLUCOSE 261* 88 168*  BUN 23* 28* 25*  CREATININE 0.99 1.24 0.93  CALCIUM 8.7* 8.7* 8.6*  AST 36  --    --   ALT 16*  --   --   ALKPHOS 54  --   --   BILITOT 0.7  --   --    ------------------------------------------------------------------------------------------------------------------ No results for input(s): CHOL, HDL, LDLCALC, TRIG, CHOLHDL, LDLDIRECT in the last 72 hours.  Lab Results  Component Value Date   HGBA1C 8.0 (H) 01/06/2016   ------------------------------------------------------------------------------------------------------------------ No results for input(s): TSH, T4TOTAL, T3FREE, THYROIDAB in the last 72 hours.  Invalid input(s): FREET3 ------------------------------------------------------------------------------------------------------------------ No results for input(s): VITAMINB12, FOLATE, FERRITIN, TIBC, IRON, RETICCTPCT in the last 72 hours.  Coagulation profile No results for input(s): INR, PROTIME in the last 168 hours.  No results for input(s): DDIMER in the last 72 hours.  Cardiac Enzymes  Recent Labs Lab 03/25/16 2153 03/26/16 0338 03/26/16 0937  TROPONINI 0.06* 0.09* 0.07*   ------------------------------------------------------------------------------------------------------------------    Component Value Date/Time   BNP 340.0 (H) 03/25/2016 1856    Micro Results Recent Results (from the past 240 hour(s))  Blood culture (routine x 2)     Status: None (Preliminary result)   Collection Time: 03/25/16  6:58 PM  Result Value Ref Range Status   Specimen Description BLOOD RIGHT ARM  Final   Special Requests BOTTLES DRAWN AEROBIC AND ANAEROBIC 6CC  Final   Culture NO GROWTH < 24 HOURS  Final   Report Status PENDING  Incomplete  Blood culture (routine x 2)     Status: None (Preliminary result)   Collection Time: 03/25/16  7:06 PM  Result Value Ref Range Status   Specimen Description BLOOD RIGHT HAND  Final   Special Requests BOTTLES DRAWN AEROBIC AND ANAEROBIC 6CC  Final   Culture NO GROWTH < 24 HOURS  Final   Report Status PENDING   Incomplete  MRSA PCR Screening     Status: None   Collection Time: 03/25/16 10:02 PM  Result Value Ref Range Status   MRSA by PCR NEGATIVE NEGATIVE Final    Comment:        The GeneXpert MRSA Assay (FDA approved for NASAL specimens only), is one component of a comprehensive MRSA colonization surveillance program. It is not intended to diagnose MRSA infection nor to guide or monitor treatment for MRSA infections.     Radiology Reports Dg Chest Port 1 View  Result Date: 03/26/2016 CLINICAL DATA:  Shortness of Breath EXAM: PORTABLE CHEST 1 VIEW COMPARISON:  March 25, 2016 FINDINGS: There is new patchy airspace consolidation in the right base, consistent with focal pneumonia. There is mild atelectasis in the left base. Lungs elsewhere are clear. Heart is mildly enlarged with pulmonary vascularity within  normal limits. No adenopathy. There is atherosclerotic calcification in the aorta. IMPRESSION: New airspace consolidation consistent with pneumonia right base. Mild left base atelectasis. Lungs elsewhere clear. Heart mildly enlarged, stable. Aortic atherosclerosis noted. Electronically Signed   By: Bretta Bang III M.D.   On: 03/26/2016 07:42   Dg Chest Portable 1 View  Result Date: 03/25/2016 CLINICAL DATA:  Sudden onset shortness of breath and hypoxia. Dyspnea. EXAM: PORTABLE CHEST 1 VIEW COMPARISON:  05/09/2015. FINDINGS: Interval mild enlargement of the cardiac silhouette with increased prominence of the pulmonary vasculature and interstitial markings. No pleural fluid. Unremarkable bones. IMPRESSION: Interval mild cardiomegaly and mild changes of acute congestive heart failure. Electronically Signed   By: Beckie Salts M.D.   On: 03/25/2016 19:15    Time Spent in minutes  30   Tkeya Stencil K M.D on 03/27/2016 at 9:17 AM  Between 7am to 7pm - Pager - [phone]  After 7pm go to [email or web address] - password [facility]  [facility] -  Office  [phone]

## 2016-03-28 DIAGNOSIS — J690 Pneumonitis due to inhalation of food and vomit: Secondary | ICD-10-CM | POA: Diagnosis not present

## 2016-03-28 DIAGNOSIS — R1310 Dysphagia, unspecified: Secondary | ICD-10-CM | POA: Diagnosis not present

## 2016-03-28 LAB — BASIC METABOLIC PANEL
Anion gap: 6 (ref 5–15)
BUN: 22 mg/dL — ABNORMAL HIGH (ref 6–20)
CO2: 31 mmol/L (ref 22–32)
Calcium: 8.5 mg/dL — ABNORMAL LOW (ref 8.9–10.3)
Chloride: 103 mmol/L (ref 101–111)
Creatinine, Ser: 0.83 mg/dL (ref 0.61–1.24)
GFR calc Af Amer: >60 mL/min (ref >60)
GFR calc non Af Amer: >60 mL/min (ref >60)
Glucose, Bld: 181 mg/dL — ABNORMAL HIGH (ref 65–99)
Potassium: 3.8 mmol/L (ref 3.5–5.1)
Sodium: 140 mmol/L (ref 135–145)

## 2016-03-28 LAB — CBC
HCT: 39.2 % (ref 39.0–52.0)
HEMOGLOBIN: 12.4 g/dL — AB (ref 13.0–17.0)
MCH: 27.4 pg (ref 26.0–34.0)
MCHC: 31.6 g/dL (ref 30.0–36.0)
MCV: 86.7 fL (ref 78.0–100.0)
Platelets: 186 10*3/uL (ref 150–400)
RBC: 4.52 MIL/uL (ref 4.22–5.81)
RDW: 13.4 % (ref 11.5–15.5)
WBC: 7 10*3/uL (ref 4.0–10.5)

## 2016-03-28 LAB — GLUCOSE, CAPILLARY
GLUCOSE-CAPILLARY: 273 mg/dL — AB (ref 65–99)
GLUCOSE-CAPILLARY: 189 mg/dL — AB (ref 65–99)
Glucose-Capillary: 174 mg/dL — ABNORMAL HIGH (ref 65–99)
Glucose-Capillary: 283 mg/dL — ABNORMAL HIGH (ref 65–99)

## 2016-03-28 MED ORDER — VANCOMYCIN HCL IN DEXTROSE 750-5 MG/150ML-% IV SOLN
INTRAVENOUS | Status: AC
  Filled 2016-03-28: qty 150

## 2016-03-28 MED ORDER — APIXABAN 5 MG PO TABS
5.0000 mg | ORAL_TABLET | Freq: Two times a day (BID) | ORAL | Status: DC
  Administered 2016-03-28 – 2016-03-31 (×7): 5 mg via ORAL
  Filled 2016-03-28 (×7): qty 1

## 2016-03-28 MED ORDER — ACETYLCYSTEINE 20 % IN SOLN
4.0000 mL | Freq: Once | RESPIRATORY_TRACT | Status: AC
  Administered 2016-03-28: 4 mL via RESPIRATORY_TRACT
  Filled 2016-03-28: qty 4

## 2016-03-28 NOTE — Progress Notes (Signed)
ANTICOAGULATION CONSULT NOTE  Pharmacy Consult for Eliquis (was on eliquis PTA) Indication: Hx pulmonary embolus  Allergies  Allergen Reactions  . Aspirin Hives and Rash   Patient Measurements: Height: 5\' 10"  (177.8 cm) Weight: 183 lb 6.4 oz (83.2 kg) IBW/kg (Calculated) : 73 HEPARIN DW (KG): 83.8  Vital Signs: Temp: 98.8 F (37.1 C) (11/05 0700) Temp Source: Axillary (11/05 0700) BP: 164/70 (11/05 0700) Pulse Rate: 78 (11/05 0700)  Labs:  Recent Labs  03/25/16 2153 03/26/16 0338 03/26/16 0937 03/27/16 0426 03/28/16 0557  HGB  --  13.8  --  13.0 12.4*  HCT  --  41.8  --  40.2 39.2  PLT  --  231  --  200 186  CREATININE  --  1.24  --  0.93 0.83  TROPONINI 0.06* 0.09* 0.07*  --   --    Estimated Creatinine Clearance: 66 mL/min (by C-G formula based on SCr of 0.83 mg/dL).  Medical History: Past Medical History:  Diagnosis Date  . Carotid artery occlusion    Cook. 05/2012 U/S Bilat ICA 20-39%.  Marland Kitchen. COPD (chronic obstructive pulmonary disease) (HCC)   . DM (diabetes mellitus) (HCC)   . Hiatal hernia   . HTN (hypertension)   . Macular degeneration   . Nephrolithiasis   . Obesity   . Pulmonary emboli (HCC)   . Stroke [facility](HCC)    Cook. 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 01/2009 TEE: EF 60-65%, mild MR, no LA/LAA thrombus.   Medications:  Prescriptions Prior to Admission  Medication Sig Dispense Refill Last Dose  . albuterol (PROVENTIL) (2.5 MG/3ML) 0.083% nebulizer solution 1 inhalation four times Cook day 8 am, 12  Pm, 4 pm, 9 pm for wheezing and SOB   03/25/2016 at Unknown time  . apixaban (ELIQUIS) 5 MG TABS tablet Take 5 mg by mouth 2 (two) times daily.   03/25/2016 at 0830  . Ascorbic Acid (VITAMIN C) 1000 MG tablet Take 1,500 mg by mouth daily.    03/25/2016 at Unknown time  . atorvastatin (LIPITOR) 10 MG tablet Take 10 mg by mouth daily.   03/25/2016 at Unknown time  . carvedilol (COREG) 3.125 MG tablet TAKE 1 TABLET BY MOUTH TWICE DAILY WITH MEALS. 180 tablet 3  03/25/2016 at 1700  . Cholecalciferol (VITAMIN D) 2000 UNITS CAPS Take 1 capsule by mouth daily after supper.   03/25/2016 at Unknown time  . Cyanocobalamin (B-12) 1000 MCG TBCR Take 1 tablet by mouth 2 (two) times daily.   03/25/2016 at Unknown time  . escitalopram (LEXAPRO) 5 MG tablet Take 5 mg by mouth every evening.   03/25/2016 at Unknown time  . furosemide (LASIX) 20 MG tablet Take 20 mg by mouth daily.   03/25/2016 at Unknown time  . lisinopril (PRINIVIL,ZESTRIL) 5 MG tablet Take 1 tablet (5 mg total) by mouth daily. 30 tablet 3 03/25/2016 at Unknown time  . metFORMIN (GLUCOPHAGE) 500 MG tablet Take 500 mg by mouth every evening. Along with 250 mg to equal 750 mg   03/25/2016 at Unknown time  . metFORMIN (GLUCOPHAGE) 500 MG tablet Take 250 mg by mouth daily with breakfast. Take with 500mg  to equal 750mg    03/25/2016 at Unknown time  . Multiple Vitamin (MULTIVITAMIN WITH MINERALS) TABS tablet Take 1 tablet by mouth daily.   03/25/2016 at Unknown time  . donepezil (ARICEPT) 10 MG tablet Take 10 mg by mouth daily after supper.    03/24/2016  . LORazepam (ATIVAN) 0.5 MG tablet Take 0.25 mg  by mouth 2 (two) times daily.    03/24/2016  . nitroGLYCERIN (NITROSTAT) 0.4 MG SL tablet Place 1 tablet (0.4 mg total) under the tongue every 5 (five) minutes as needed for chest pain. 25 tablet 3 unknown  . tamsulosin (FLOMAX) 0.4 MG CAPS capsule Take 0.4 mg by mouth at bedtime.   03/24/2016  . traMADol (ULTRAM) 50 MG tablet Take 1 tablet (50 mg total) by mouth 3 (three) times daily as needed for moderate pain. For pain 20 tablet 0 03/24/2016   Assessment: Had been on SQ Lovenox for Hx PE.  Pt was on Apixaban PTA: dose = 5mg  BID which is appropriate. No bleeding noted.  CBC appears stable.   Goal of Therapy:  Anticoagulation with Eliquis Monitor platelets by anticoagulation protocol: Yes   Plan:  Resume Eliquis 5mg  PO bid Monitor for signs and symptoms of bleeding.   George Cook, George Cook 03/28/2016,10:57 AM

## 2016-03-28 NOTE — Progress Notes (Signed)
PROGRESS NOTE                                                                                                                                                                                                             Patient Demographics:    George Cook, is a 82 y.o. male, DOB - Sep 29, 1927, ZDG:[id number]  Admit date - 03/25/2016   Admitting Physician Clydie Braunondell A Smith, MD  Outpatient Primary MD for the patient is Dwana MelenaZack Hall, MD  LOS - 3  Chief Complaint  Patient presents with  . Shortness of Breath       Brief Narrative George Cook is a 82 y.o. male with medical history significant of dementia, HTN, CAD, COPD, CVA with residual dysphagia, DM type II, and PE on chronic anticoagulation therapy; who presents from nursing facility with complaints of respiratory distress, workup suggested sepsis due to aspiration pneumonia causing acute respiratory failure.   Subjective:    George MawBilly Aiello today has, No headache, No chest pain, No abdominal pain - No Nausea, No new weakness tingling or numbness, No Cough - SOB. Says no to all questions   Assessment  & Plan :     1. Acute hypoxic and hypercapnic respiratory failure due to recurrent microaspiration causing aspiration pneumonia with sepsis. Overall prognosis is guarded, speech following on D1 HT diet but clinically he still microaspiration, longterm prognosis is poor, Pall care following as well, continue monitor cultures, sepsis physiology has resolved, continue empiric IV antibiotics, oxygen and nebulizer treatments, respiratory therapy requested to provide chest PT - Flutter valve. We'll try to increase activity and increased sitting in the chair. However due to continued microaspiration his prognosis is extremely guarded. Likely will discharge to SNF with hospice.  2. History of DVT PE. Switch back to Eliquis which he takes at home.  3. Dementia with delirium. PRN Haldol.  Supportive care, minimize narcotics and benzodiazepines.  4. Mild acute on chronic diastolic CHF and recent EF 55%. Close to compensated continue gentle Lasix, echocardiogram noted, no beta blocker due to bradycardia.  5. Mildly elevated troponin due to demand ischemia from #1 above, troponin trend on ACS and flat, chest pain-free, supportive care only for now. Currently not a candidate for invasive testing. Coreg stopped as having episodes of Bradycardia.  6. Mod-Sever AS on TTE - not a candidate for  surgery or percutaneous intervention, supportive care.   7. DM type II. Sliding scale for now.  Lab Results  Component Value Date   HGBA1C 8.0 (H) 01/06/2016   CBG (last 3)   Recent Labs  03/27/16 1639 03/27/16 2113 03/28/16 0725  GLUCAP 126* 244* 174*      Family Communication  :  None  Code Status :  DNR  Diet : D1 - Honey Thick  Disposition Plan  :  SNF with palliative care in 1-2 days  Consults  :  Pall Care  Procedures  :    TTE - Low normal LV systolic function EF 55%; grade 1 diastolic dysfunction; calcified aortic valve with moderate to severe AS (moderate by mean gradient; severe by AVA); mild MR.   DVT Prophylaxis  :  Lovenox   Lab Results  Component Value Date   PLT 186 03/28/2016    Inpatient Medications  Scheduled Meds: . acetylcysteine  4 mL Nebulization Once  . atorvastatin  10 mg Oral q1800  . donepezil  10 mg Oral QPC supper  . furosemide  40 mg Intravenous Daily  . insulin aspart  0-9 Units Subcutaneous TID WC  . LORazepam  0.25 mg Oral QHS  . multivitamin with minerals  1 tablet Oral Daily  . piperacillin-tazobactam (ZOSYN)  IV  3.375 g Intravenous Q8H  . tamsulosin  0.4 mg Oral QHS  . vancomycin  750 mg Intravenous Q12H  . vitamin B-12  1,000 mcg Oral BID   Continuous Infusions:  PRN Meds:.acetaminophen, haloperidol lactate, hydrALAZINE, ipratropium-albuterol, nitroGLYCERIN, ondansetron (ZOFRAN) IV, RESOURCE THICKENUP  CLEAR  Antibiotics  :    Anti-infectives    Start     Dose/Rate Route Frequency Ordered Stop   03/26/16 0900  vancomycin (VANCOCIN) IVPB 750 mg/150 ml premix     750 mg 150 mL/hr over 60 Minutes Intravenous Every 12 hours 03/25/16 2228     03/26/16 0500  piperacillin-tazobactam (ZOSYN) IVPB 3.375 g     3.375 g 12.5 mL/hr over 240 Minutes Intravenous Every 8 hours 03/25/16 2228     03/25/16 1945  vancomycin (VANCOCIN) IVPB 1000 mg/200 mL premix     1,000 mg 200 mL/hr over 60 Minutes Intravenous  Once 03/25/16 1930 03/25/16 2125   03/25/16 1945  piperacillin-tazobactam (ZOSYN) IVPB 3.375 g     3.375 g 100 mL/hr over 30 Minutes Intravenous  Once 03/25/16 1930 03/25/16 2125         Objective:   Vitals:   03/27/16 1200 03/27/16 1217 03/27/16 2013 03/28/16 0700  BP: 134/88  (!) 160/73 (!) 164/70  Pulse: (!) 42  96 78  Resp: (!) 30  (!) 24 (!) 30  Temp:  98.7 F (37.1 C) 99 F (37.2 C) 98.8 F (37.1 C)  TempSrc:  Oral Oral Axillary  SpO2: 94%  100% 98%  Weight:    83.2 kg (183 lb 6.4 oz)  Height:        Wt Readings from Last 3 Encounters:  03/28/16 83.2 kg (183 lb 6.4 oz)  02/25/16 92.5 kg (204 lb)  12/25/15 87.1 kg (192 lb)     Intake/Output Summary (Last 24 hours) at 03/28/16 0900 Last data filed at 03/28/16 0843  Gross per 24 hour  Intake           1122.5 ml  Output             2200 ml  Net          -1077.5 ml  Physical Exam  Awake, pleasantly confused, No new F.N deficits,   [place].AT,PERRAL Supple Neck,No JVD, No cervical lymphadenopathy appriciated.  Symmetrical Chest wall movement, Good air movement bilaterally, Coarse B sounds RRR,No Gallops,Rubs or new Murmurs, No Parasternal Heave +ve B.Sounds, Abd Soft, No tenderness, No organomegaly appriciated, No rebound - guarding or rigidity. No Cyanosis, Clubbing or edema, No new Rash or bruise      Data Review:    CBC  Recent Labs Lab 03/25/16 1856 03/26/16 0338 03/27/16 0426 03/28/16 0557  WBC  16.8* 21.3* 10.8* 7.0  HGB 13.6 13.8 13.0 12.4*  HCT 41.6 41.8 40.2 39.2  PLT 271 231 200 186  MCV 87.8 85.5 87.2 86.7  MCH 28.7 28.2 28.2 27.4  MCHC 32.7 33.0 32.3 31.6  RDW 13.3 13.5 13.6 13.4  LYMPHSABS 3.9  --   --   --   MONOABS 1.0  --   --   --   EOSABS 0.5  --   --   --   BASOSABS 0.1  --   --   --     Chemistries   Recent Labs Lab 03/25/16 1856 03/26/16 0338 03/27/16 0426 03/28/16 0557  NA 135 135 138 140  K 4.6 4.3 4.1 3.8  CL 99* 98* 101 103  CO2 27 29 28 31   GLUCOSE 261* 88 168* 181*  BUN 23* 28* 25* 22*  CREATININE 0.99 1.24 0.93 0.83  CALCIUM 8.7* 8.7* 8.6* 8.5*  AST 36  --   --   --   ALT 16*  --   --   --   ALKPHOS 54  --   --   --   BILITOT 0.7  --   --   --    ------------------------------------------------------------------------------------------------------------------ No results for input(s): CHOL, HDL, LDLCALC, TRIG, CHOLHDL, LDLDIRECT in the last 72 hours.  Lab Results  Component Value Date   HGBA1C 8.0 (H) 01/06/2016   ------------------------------------------------------------------------------------------------------------------ No results for input(s): TSH, T4TOTAL, T3FREE, THYROIDAB in the last 72 hours.  Invalid input(s): FREET3 ------------------------------------------------------------------------------------------------------------------ No results for input(s): VITAMINB12, FOLATE, FERRITIN, TIBC, IRON, RETICCTPCT in the last 72 hours.  Coagulation profile No results for input(s): INR, PROTIME in the last 168 hours.  No results for input(s): DDIMER in the last 72 hours.  Cardiac Enzymes  Recent Labs Lab 03/25/16 2153 03/26/16 0338 03/26/16 0937  TROPONINI 0.06* 0.09* 0.07*   ------------------------------------------------------------------------------------------------------------------    Component Value Date/Time   BNP 340.0 (H) 03/25/2016 1856    Micro Results Recent Results (from the past 240 hour(s))   Blood culture (routine x 2)     Status: None (Preliminary result)   Collection Time: 03/25/16  6:58 PM  Result Value Ref Range Status   Specimen Description BLOOD RIGHT ARM  Final   Special Requests BOTTLES DRAWN AEROBIC AND ANAEROBIC 6CC  Final   Culture NO GROWTH 3 DAYS  Final   Report Status PENDING  Incomplete  Blood culture (routine x 2)     Status: None (Preliminary result)   Collection Time: 03/25/16  7:06 PM  Result Value Ref Range Status   Specimen Description BLOOD RIGHT HAND  Final   Special Requests BOTTLES DRAWN AEROBIC AND ANAEROBIC 6CC  Final   Culture NO GROWTH 3 DAYS  Final   Report Status PENDING  Incomplete  MRSA PCR Screening     Status: None   Collection Time: 03/25/16 10:02 PM  Result Value Ref Range Status   MRSA by PCR NEGATIVE NEGATIVE Final  Comment:        The GeneXpert MRSA Assay (FDA approved for NASAL specimens only), is one component of a comprehensive MRSA colonization surveillance program. It is not intended to diagnose MRSA infection nor to guide or monitor treatment for MRSA infections.     Radiology Reports Dg Chest Port 1 View  Result Date: 03/26/2016 CLINICAL DATA:  Shortness of Breath EXAM: PORTABLE CHEST 1 VIEW COMPARISON:  March 25, 2016 FINDINGS: There is new patchy airspace consolidation in the right base, consistent with focal pneumonia. There is mild atelectasis in the left base. Lungs elsewhere are clear. Heart is mildly enlarged with pulmonary vascularity within normal limits. No adenopathy. There is atherosclerotic calcification in the aorta. IMPRESSION: New airspace consolidation consistent with pneumonia right base. Mild left base atelectasis. Lungs elsewhere clear. Heart mildly enlarged, stable. Aortic atherosclerosis noted. Electronically Signed   By: Bretta Bang III M.D.   On: 03/26/2016 07:42   Dg Chest Portable 1 View  Result Date: 03/25/2016 CLINICAL DATA:  Sudden onset shortness of breath and hypoxia. Dyspnea.  EXAM: PORTABLE CHEST 1 VIEW COMPARISON:  05/09/2015. FINDINGS: Interval mild enlargement of the cardiac silhouette with increased prominence of the pulmonary vasculature and interstitial markings. No pleural fluid. Unremarkable bones. IMPRESSION: Interval mild cardiomegaly and mild changes of acute congestive heart failure. Electronically Signed   By: Beckie Salts M.D.   On: 03/25/2016 19:15    Time Spent in minutes  30   SINGH,PRASHANT K M.D on 03/28/2016 at 9:00 AM  Between 7am to 7pm - Pager - [phone]  After 7pm go to [email or web address] - password [facility]  [facility] -  Office  [phone]

## 2016-03-28 NOTE — Progress Notes (Signed)
This is an acute visit.  Level care skilled.  Facility is [facility].  Chief complaint-acute visit secondary to hypoxia with distress.  History of present illness.  Patient is a very pleasant 82 year old male who is a long-term resident at this facility He has been enjoying a period of fairly extended stability here he does have numerous medical issues including coronary artery disease COPD history of PE and DVT as well as diabetes and dementia. It appears he also has a history of diastolic CHF he is on low-dose Lasix and a beta blocker  Patient was in his normal state up to early this evening-he apparently ate dinner quite well shortly thereafter when the nursing tech was in the room he became fairly suddenly distressed-nursing tech reports  he had gurgling sounds-and appeared to be extremely anxious.  When I arrived in the room he was significantly congested-and anxious-O2 saturation was difficult to obtain but the reading we were able to obtain was in the 30s.  Blood pressure systolid also was elevated which is understandable  EMS was called-he was on oxygen  and nursing staff and EMS were able to start a non-rebreather.  Patient continued to be quite anxious and distressed-but did not appear to be precipitously declining in the period that I was able to assess him Continue to have gurgling breath sounds and significant chest congestion. Past Medical History:  Diagnosis Date  . Carotid artery occlusion    a. 05/2012 U/S Bilat ICA 20-39%.  . DM (diabetes mellitus) (HCC)   . Hiatal hernia   . HTN (hypertension)   . Macular degeneration   . Nephrolithiasis   . Obesity   . Stroke [facility](HCC)    a. 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 01/2009 TEE: EF 60-65%, mild MR, no LA/LAA thrombus.        Past Surgical History:  Procedure Laterality Date  . CATARACT EXTRACTION W/PHACO Right 10/02/2012   Procedure: CATARACT EXTRACTION PHACO AND INTRAOCULAR LENS PLACEMENT (IOC);   Surgeon: Gemma PayorKerry Hunt, MD;  Location: [facility];  Service: Ophthalmology;  Laterality: Right;  CDE 12.78  . CATARACT EXTRACTION W/PHACO Left 10/30/2012   Procedure: CATARACT EXTRACTION PHACO AND INTRAOCULAR LENS PLACEMENT (IOC);  Surgeon: Gemma PayorKerry Hunt, MD;  Location: [facility];  Service: Ophthalmology;  Laterality: Left;  CDE: 19.10  . EYE SURGERY    . FINGER SURGERY Left    pinky, after saw cut it off  . LEFT HEART CATHETERIZATION WITH CORONARY ANGIOGRAM Bilateral 03/08/2013   Procedure: LEFT HEART CATHETERIZATION WITH CORONARY ANGIOGRAM;  Surgeon: Laurey Moralealton S McLean, MD;  [facility];  Service: Cardiovascular;  Laterality: Bilateral;  . TEE WITHOUT CARDIOVERSION N/A 12/16/2014   Procedure: TRANSESOPHAGEAL ECHOCARDIOGRAM (TEE);  Surgeon: Antoine PocheJonathan F Branch, MD;  Location: [facility] SUITE;  Service: Cardiology;  Laterality: N/A;  will need 40 minutes to clean scope between procedures        Allergies  Allergen Reactions  . Aspirin Hives and Rash          Medication List                     albuterol (2.5 MG/3ML) 0.083% nebulizer solution Commonly known as:  PROVENTIL 1 inhalation four times a day 8 am, 12  Pm, 4 pm, 9 pm for wheezing and SOB  ALOE VESTA PROTECTIVE Oint Apply to sacrum and bilateral buttock three times daily  atorvastatin 10 MG tablet Commonly known as:  LIPITOR Take 10 mg by mouth daily.  B-12 1000 MCG Tbcr Take 1 tablet by mouth 2 (two) times daily.  carvedilol 3.125 MG tablet Commonly known as:  COREG TAKE 1 TABLET BY MOUTH TWICE DAILY WITH MEALS.  donepezil 10 MG tablet Commonly known as:  ARICEPT Take 10 mg by mouth daily after supper.  ELIQUIS 5 MG Tabs tablet Generic drug:  apixaban Take 5 mg by mouth 2 (two) times daily.  ENSURE Take one by mouth once daily  escitalopram 5 MG tablet Commonly known as:  LEXAPRO Take 5 mg by mouth every evening.  FLOMAX 0.4 MG Caps capsule Generic drug:  tamsulosin Take 0.4 mg by mouth at bedtime.    furosemide 20 MG tablet Commonly known as:  LASIX Take 20 mg by mouth daily.  lisinopril 5 MG tablet Commonly known as:  PRINIVIL,ZESTRIL Take 1 tablet (5 mg total) by mouth daily.  LORazepam 0.5 MG tablet Commonly known as:  ATIVAN Take 0.25 mg by mouth at bedtime.  metFORMIN 500 MG tablet Commonly known as:  GLUCOPHAGE Take 250 mg by mouth every evening.  metFORMIN 500 MG tablet Commonly known as:  GLUCOPHAGE Take 500 mg by mouth daily.  multivitamin with minerals Tabs tablet Take 1 tablet by mouth daily.  nitroGLYCERIN 0.4 MG SL tablet Commonly known as:  NITROSTAT Place 1 tablet (0.4 mg total) under the tongue every 5 (five) minutes as needed for chest pain.  OXYGEN 2L/ mn via N/C prn to maintain stas >90 % Every shift  traMADol 50 MG tablet Commonly known as:  ULTRAM Take 1 tablet (50 mg total) by mouth 3 (three) times daily as needed for moderate pain. For pain  vitamin C 1000 MG tablet Take 1,500 mg by mouth daily.  Vitamin D 2000 units Caps Take 1 capsule by mouth daily after supper.     Review of systems essentially impossible to obtained please see history of present illness.  Physical exam.  Vital signs somewhat were difficult to obtain secondary to patient's distress O2 saturation was in the 30s heart rate was difficult to assess secondary to significant congestion-systolic blood pressure was elevated in the 170s.  In general this is a frail elderly male who appears to be in some distress he is turning her in bed groping appears to be uncomfortable.  His skin is slightly diaphoretic.  Oropharynx mucous membranes are moist his throat appears to be clear tongue is midline.  Chest has diffuse congestion with use of accessory muscles very difficult to auscultate any heart sounds.  Heart is difficult to assess secondary to the congested breath sounds.--  Abdomen is soft does not appear to be tender again patient is distress so difficult to really localize any  discomfort here but it does not appear to be coming from the abdomen.  Muscle skeletal he is able to move all extremities 4 is somewhat groping with his hands appears anxious.  Neurologic is grossly intact at baseline.  Psych again he appears to be in some distress is able to follow verbal commands however and make eye contact.  Labs.  June  seventh 2017.  Sodium 138 potassium 4.2 BUN 17 creatinine 0.84  10/21/2015.  WBC 7.0 hemoglobin 12.7 platelets 195   Assessment and plan.  #1-history of respiratory distress with hypoxia--patient appeared to have had an acute change in status that was quite sudden as noted above-he was monitored before EMS arrived and did not appear to be precipitously declining in that period-continued however to be quite uncomfortable-again we did send him out  secondary to acute respiratory distress--one would be concerned about aspiration since he had just been eating-or possibly CHF exacerbation aspiration-or sepsis  [id number]

## 2016-03-29 ENCOUNTER — Inpatient Hospital Stay (HOSPITAL_COMMUNITY): Payer: PPO

## 2016-03-29 DIAGNOSIS — R1310 Dysphagia, unspecified: Secondary | ICD-10-CM | POA: Diagnosis not present

## 2016-03-29 DIAGNOSIS — R0602 Shortness of breath: Secondary | ICD-10-CM | POA: Diagnosis not present

## 2016-03-29 DIAGNOSIS — J690 Pneumonitis due to inhalation of food and vomit: Secondary | ICD-10-CM | POA: Diagnosis not present

## 2016-03-29 LAB — BASIC METABOLIC PANEL
ANION GAP: 7 (ref 5–15)
BUN: 21 mg/dL — ABNORMAL HIGH (ref 6–20)
CHLORIDE: 101 mmol/L (ref 101–111)
CO2: 32 mmol/L (ref 22–32)
Calcium: 8.7 mg/dL — ABNORMAL LOW (ref 8.9–10.3)
Creatinine, Ser: 0.92 mg/dL (ref 0.61–1.24)
GFR calc Af Amer: >60 mL/min (ref >60)
GLUCOSE: 182 mg/dL — AB (ref 65–99)
POTASSIUM: 3.6 mmol/L (ref 3.5–5.1)
SODIUM: 140 mmol/L (ref 135–145)

## 2016-03-29 LAB — CBC
HEMATOCRIT: 39.7 % (ref 39.0–52.0)
HEMOGLOBIN: 12.8 g/dL — AB (ref 13.0–17.0)
MCH: 27.9 pg (ref 26.0–34.0)
MCHC: 32.2 g/dL (ref 30.0–36.0)
MCV: 86.5 fL (ref 78.0–100.0)
Platelets: 198 10*3/uL (ref 150–400)
RBC: 4.59 MIL/uL (ref 4.22–5.81)
RDW: 13.3 % (ref 11.5–15.5)
WBC: 7 10*3/uL (ref 4.0–10.5)

## 2016-03-29 LAB — GLUCOSE, CAPILLARY
GLUCOSE-CAPILLARY: 164 mg/dL — AB (ref 65–99)
GLUCOSE-CAPILLARY: 234 mg/dL — AB (ref 65–99)
Glucose-Capillary: 289 mg/dL — ABNORMAL HIGH (ref 65–99)
Glucose-Capillary: 251 mg/dL — ABNORMAL HIGH (ref 65–99)

## 2016-03-29 LAB — BRAIN NATRIURETIC PEPTIDE: B Natriuretic Peptide: 577 pg/mL — ABNORMAL HIGH (ref 0.0–100.0)

## 2016-03-29 MED ORDER — POTASSIUM CHLORIDE CRYS ER 20 MEQ PO TBCR
40.0000 meq | EXTENDED_RELEASE_TABLET | Freq: Every day | ORAL | Status: DC
  Administered 2016-03-29: 40 meq via ORAL
  Filled 2016-03-29 (×2): qty 2

## 2016-03-29 MED ORDER — FUROSEMIDE 10 MG/ML IJ SOLN
80.0000 mg | Freq: Every day | INTRAMUSCULAR | Status: DC
  Administered 2016-03-29: 80 mg via INTRAVENOUS
  Filled 2016-03-29 (×2): qty 8

## 2016-03-29 MED ORDER — NITROGLYCERIN 2 % TD OINT
0.5000 [in_us] | TOPICAL_OINTMENT | Freq: Four times a day (QID) | TRANSDERMAL | Status: DC
  Administered 2016-03-29 – 2016-03-31 (×10): 0.5 [in_us] via TOPICAL
  Filled 2016-03-29 (×9): qty 1

## 2016-03-29 MED ORDER — FUROSEMIDE 10 MG/ML IJ SOLN: 40.0000 mg | Freq: Two times a day (BID) | INTRAMUSCULAR | Status: DC

## 2016-03-29 MED ORDER — FUROSEMIDE 10 MG/ML IJ SOLN
80.0000 mg | Freq: Two times a day (BID) | INTRAMUSCULAR | Status: DC
  Administered 2016-03-29 – 2016-03-31 (×4): 80 mg via INTRAVENOUS
  Filled 2016-03-29 (×4): qty 8

## 2016-03-29 NOTE — Care Management Important Message (Signed)
Important Message  Patient Details  Name: George Cook MRN: [name] Date of Birth: [birth year]   Medicare Important Message Given:  Yes    [name], Chrystine OilerSharley Diane, RN 03/29/2016, 9:05 AM

## 2016-03-29 NOTE — Progress Notes (Signed)
Pharmacy Antibiotic Note  George Cook is a 82 y.o. male admitted on 03/25/2016 with sepsis.  Pharmacy has been consulted for Vancomycin and Zosyn  Dosing.  MRSA PCR (-) > consider d/c Vancomycin  Plan: Vancomycin 750 mg IV every 12 hours.  Goal trough 15-20 mcg/mL. Zosyn 3.375g IV q8h (4 hour infusion).  Monitor labs, micro and vitals.  Check trough level on 11/7  Height: 5\' 10"  (177.8 cm) Weight: 177 lb 4.8 oz (80.4 kg) IBW/kg (Calculated) : 73  Temp (24hrs), Avg:98.7 F (37.1 C), Min:98.4 F (36.9 C), Max:98.9 F (37.2 C)   Recent Labs Lab 03/25/16 1856 03/25/16 1904 03/25/16 1916 03/26/16 0113 03/26/16 0338 03/27/16 0426 03/28/16 0557 03/29/16 0616  WBC 16.8*  --   --   --  21.3* 10.8* 7.0 7.0  CREATININE 0.99  --   --   --  1.24 0.93 0.83 0.92  LATICACIDVEN  --  2.7* 4.77* 2.9*  --   --   --   --     Estimated Creatinine Clearance: 59.5 mL/min (by C-G formula based on SCr of 0.92 mg/dL).    Allergies  Allergen Reactions  . Aspirin Hives and Rash   Antimicrobials this admission: Zosyn 11/2 >>  Vanc 11/2 >>   Dose adjustments this admission: n/a  Microbiology results: 11/2 BCx: pending 11/2 MRSA PCR: (-)  Thank you for allowing pharmacy to be a part of this patient's care.  Valrie HartHall, Andersyn Fragoso A 03/29/2016 10:26 AM

## 2016-03-29 NOTE — Progress Notes (Signed)
PROGRESS NOTE                                                                                                                                                                                                             Patient Demographics:    George Cook, is a 82 y.o. male, DOB - 13-Oct-1927, QMV:[id number]  Admit date - 03/25/2016   Admitting Physician Clydie Braunondell A Smith, MD  Outpatient Primary MD for the patient is Dwana MelenaZack Hall, MD  LOS - 4  Chief Complaint  Patient presents with  . Shortness of Breath       Brief Narrative George Cook is a 82 y.o. male with medical history significant of dementia, HTN, CAD, COPD, CVA with residual dysphagia, DM type II, and PE on chronic anticoagulation therapy; who presents from nursing facility with complaints of respiratory distress, workup suggested sepsis due to aspiration pneumonia causing acute respiratory failure.   Subjective:    George Cook today has, No headache, No chest pain, No abdominal pain - No Nausea, No new weakness tingling or numbness, No Cough - SOB. Says no to all questions   Assessment  & Plan :     1. Acute hypoxic and hypercapnic respiratory failure due to recurrent microaspiration causing aspiration pneumonia with sepsis. Overall prognosis is guarded, speech following on D1 HT diet but clinically he still microaspiration, longterm prognosis is poor, Pall care following as well, continue monitor cultures, sepsis physiology has resolved, continue empiric IV antibiotics, oxygen and nebulizer treatments, respiratory therapy requested to provide chest PT - Flutter valve.   We'll try to increase activity and increased sitting in the chair. However due to continued microaspiration his prognosis is extremely guarded. Likely will discharge to SNF with hospice.  2. History of DVT PE. Switch back to Eliquis which he takes at home.  3. Dementia with delirium. PRN Haldol.  Supportive care, minimize narcotics and benzodiazepines.  4. Acute on chronic diastolic CHF and recent EF 55%. On 03/29/2016 he has developed rales in his CHF appears to be worse despite 40 of Lasix daily, have increased Lasix dose, added Nitropaste, echocardiogram noted, no beta blocker due to bradycardia.  5. Mildly elevated troponin due to demand ischemia from #1 above, troponin trend on ACS and flat, chest pain-free, supportive care only for now. Currently not a candidate for invasive  testing. Coreg stopped as having episodes of Bradycardia.  6. 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   7. DM type II. Sliding scale for now.  Lab Results  Component Value Date   HGBA1C 8.0 (H) 01/06/2016   CBG (last 3)   Recent Labs  03/28/16 1650 03/28/16 2041 03/29/16 0743  GLUCAP 189* 283* 164*      Family Communication  :  None  Code Status :  DNR  Diet : D1 - Honey Thick  Disposition Plan  :  SNF with palliative care Likely Tuesday or Wednesday  Consults  :  Pall Care  Procedures  :    TTE - Low normal LV systolic function EF 55%; grade 1 diastolic dysfunction; calcified aortic valve with moderate to severe AS (moderate by mean gradient; severe by AVA); mild MR.   DVT Prophylaxis  :  Lovenox   Lab Results  Component Value Date   PLT 198 03/29/2016    Inpatient Medications  Scheduled Meds: . apixaban  5 mg Oral BID  . atorvastatin  10 mg Oral q1800  . donepezil  10 mg Oral QPC supper  . furosemide  40 mg Intravenous BID  . furosemide  80 mg Intravenous Daily  . insulin aspart  0-9 Units Subcutaneous TID WC  . multivitamin with minerals  1 tablet Oral Daily  . nitroGLYCERIN  0.5 inch Topical Q6H  . piperacillin-tazobactam (ZOSYN)  IV  3.375 g Intravenous Q8H  . potassium chloride  40 mEq Oral Daily  . tamsulosin  0.4 mg Oral QHS  . vancomycin  750 mg Intravenous Q12H  . vitamin B-12  1,000 mcg Oral BID    Continuous Infusions:  PRN Meds:.acetaminophen, haloperidol lactate, hydrALAZINE, ipratropium-albuterol, nitroGLYCERIN, ondansetron (ZOFRAN) IV, RESOURCE THICKENUP CLEAR  Antibiotics  :    Anti-infectives    Start     Dose/Rate Route Frequency Ordered Stop   03/26/16 0900  vancomycin (VANCOCIN) IVPB 750 mg/150 ml premix     750 mg 150 mL/hr over 60 Minutes Intravenous Every 12 hours 03/25/16 2228     03/26/16 0500  piperacillin-tazobactam (ZOSYN) IVPB 3.375 g     3.375 g 12.5 mL/hr over 240 Minutes Intravenous Every 8 hours 03/25/16 2228     03/25/16 1945  vancomycin (VANCOCIN) IVPB 1000 mg/200 mL premix     1,000 mg 200 mL/hr over 60 Minutes Intravenous  Once 03/25/16 1930 03/25/16 2125   03/25/16 1945  piperacillin-tazobactam (ZOSYN) IVPB 3.375 g     3.375 g 100 mL/hr over 30 Minutes Intravenous  Once 03/25/16 1930 03/25/16 2125         Objective:   Vitals:   03/28/16 1112 03/28/16 1449 03/28/16 2043 03/29/16 0556  BP:  (!) 185/90 (!) 184/89 (!) 172/84  Pulse:  85 65 74  Resp:  20 20 20   Temp:  98.7 F (37.1 C) 98.9 F (37.2 C) 98.4 F (36.9 C)  TempSrc:  Oral Oral Oral  SpO2: 98% 100% 95% 95%  Weight:    80.4 kg (177 lb 4.8 oz)  Height:        Wt Readings from Last 3 Encounters:  03/29/16 80.4 kg (177 lb 4.8 oz)  02/25/16 92.5 kg (204 lb)  12/25/15 87.1 kg (192 lb)     Intake/Output Summary (Last 24 hours) at 03/29/16 1610 Last data filed at 03/29/16 0602  Gross per 24 hour  Intake  2070 ml  Output              800 ml  Net             1270 ml     Physical Exam  Awake, pleasantly confused, No new F.N deficits,   .AT,PERRAL Supple Neck,No JVD, No cervical lymphadenopathy appriciated.  Symmetrical Chest wall movement, Good air movement bilaterally, Coarse B sounds, +ve rales RRR,No Gallops,Rubs or new Murmurs, No Parasternal Heave +ve B.Sounds, Abd Soft, No tenderness, No organomegaly appriciated, No rebound - guarding or rigidity. No  Cyanosis, Clubbing or edema, No new Rash or bruise      Data Review:    CBC  Recent Labs Lab 03/25/16 1856 03/26/16 0338 03/27/16 0426 03/28/16 0557 03/29/16 0616  WBC 16.8* 21.3* 10.8* 7.0 7.0  HGB 13.6 13.8 13.0 12.4* 12.8*  HCT 41.6 41.8 40.2 39.2 39.7  PLT 271 231 200 186 198  MCV 87.8 85.5 87.2 86.7 86.5  MCH 28.7 28.2 28.2 27.4 27.9  MCHC 32.7 33.0 32.3 31.6 32.2  RDW 13.3 13.5 13.6 13.4 13.3  LYMPHSABS 3.9  --   --   --   --   MONOABS 1.0  --   --   --   --   EOSABS 0.5  --   --   --   --   BASOSABS 0.1  --   --   --   --     Chemistries   Recent Labs Lab 03/25/16 1856 03/26/16 0338 03/27/16 0426 03/28/16 0557 03/29/16 0616  NA 135 135 138 140 140  K 4.6 4.3 4.1 3.8 3.6  CL 99* 98* 101 103 101  CO2 27 29 28 31  32  GLUCOSE 261* 88 168* 181* 182*  BUN 23* 28* 25* 22* 21*  CREATININE 0.99 1.24 0.93 0.83 0.92  CALCIUM 8.7* 8.7* 8.6* 8.5* 8.7*  AST 36  --   --   --   --   ALT 16*  --   --   --   --   ALKPHOS 54  --   --   --   --   BILITOT 0.7  --   --   --   --    ------------------------------------------------------------------------------------------------------------------ No results for input(s): CHOL, HDL, LDLCALC, TRIG, CHOLHDL, LDLDIRECT in the last 72 hours.  Lab Results  Component Value Date   HGBA1C 8.0 (H) 01/06/2016   ------------------------------------------------------------------------------------------------------------------ No results for input(s): TSH, T4TOTAL, T3FREE, THYROIDAB in the last 72 hours.  Invalid input(s): FREET3 ------------------------------------------------------------------------------------------------------------------ No results for input(s): VITAMINB12, FOLATE, FERRITIN, TIBC, IRON, RETICCTPCT in the last 72 hours.  Coagulation profile No results for input(s): INR, PROTIME in the last 168 hours.  No results for input(s): DDIMER in the last 72 hours.  Cardiac Enzymes  Recent Labs Lab 03/25/16 2153  03/26/16 0338 03/26/16 0937  TROPONINI 0.06* 0.09* 0.07*   ------------------------------------------------------------------------------------------------------------------    Component Value Date/Time   BNP 340.0 (H) 03/25/2016 1856    Micro Results Recent Results (from the past 240 hour(s))  Blood culture (routine x 2)     Status: None (Preliminary result)   Collection Time: 03/25/16  6:58 PM  Result Value Ref Range Status   Specimen Description BLOOD RIGHT ARM  Final   Special Requests BOTTLES DRAWN AEROBIC AND ANAEROBIC 6CC  Final   Culture NO GROWTH 3 DAYS  Final   Report Status PENDING  Incomplete  Blood culture (routine x 2)     Status: None (Preliminary  result)   Collection Time: 03/25/16  7:06 PM  Result Value Ref Range Status   Specimen Description BLOOD RIGHT HAND  Final   Special Requests BOTTLES DRAWN AEROBIC AND ANAEROBIC 6CC  Final   Culture NO GROWTH 3 DAYS  Final   Report Status PENDING  Incomplete  MRSA PCR Screening     Status: None   Collection Time: 03/25/16 10:02 PM  Result Value Ref Range Status   MRSA by PCR NEGATIVE NEGATIVE Final    Comment:        The GeneXpert MRSA Assay (FDA approved for NASAL specimens only), is one component of a comprehensive MRSA colonization surveillance program. It is not intended to diagnose MRSA infection nor to guide or monitor treatment for MRSA infections.     Radiology Reports Dg Chest Port 1 View  Result Date: 03/29/2016 CLINICAL DATA:  Shortness breath. EXAM: PORTABLE CHEST 1 VIEW COMPARISON:  03/26/2016. FINDINGS: Cardiomegaly with mild bilateral from interstitial prominence. Mild congestive heart failure cannot be excluded. Low lung volumes. Interim slight improvement of right base atelectasis/infiltrate. Calcified pulmonary nodules left upper lobe consistent granulomas Tiny left pleural effusion cannot be excluded. No pneumothorax . IMPRESSION: 1. Mild congestive heart failure cannot be excluded. Tiny left  pleural effusion. 2.  Persistent but slightly right base atelectatic changes. Electronically Signed   By: Maisie Fushomas  Register   On: 03/29/2016 07:46   Dg Chest Port 1 View  Result Date: 03/26/2016 CLINICAL DATA:  Shortness of Breath EXAM: PORTABLE CHEST 1 VIEW COMPARISON:  March 25, 2016 FINDINGS: There is new patchy airspace consolidation in the right base, consistent with focal pneumonia. There is mild atelectasis in the left base. Lungs elsewhere are clear. Heart is mildly enlarged with pulmonary vascularity within normal limits. No adenopathy. There is atherosclerotic calcification in the aorta. IMPRESSION: New airspace consolidation consistent with pneumonia right base. Mild left base atelectasis. Lungs elsewhere clear. Heart mildly enlarged, stable. Aortic atherosclerosis noted. Electronically Signed   By: Bretta BangWilliam  Woodruff III M.D.   On: 03/26/2016 07:42   Dg Chest Portable 1 View  Result Date: 03/25/2016 CLINICAL DATA:  Sudden onset shortness of breath and hypoxia. Dyspnea. EXAM: PORTABLE CHEST 1 VIEW COMPARISON:  05/09/2015. FINDINGS: Interval mild enlargement of the cardiac silhouette with increased prominence of the pulmonary vasculature and interstitial markings. No pleural fluid. Unremarkable bones. IMPRESSION: Interval mild cardiomegaly and mild changes of acute congestive heart failure. Electronically Signed   By: Beckie SaltsSteven  Reid M.D.   On: 03/25/2016 19:15    Time Spent in minutes  30   Jewelz Ricklefs K M.D on 03/29/2016 at 8:23 AM  Between 7am to 7pm - Pager - [phone]  After 7pm go to [email or web address] - password [email or web address]  [facility] -  Office  [phone]

## 2016-03-29 NOTE — Progress Notes (Signed)
Inpatient Diabetes Program Recommendations  AACE/ADA: New Consensus Statement on Inpatient Glycemic Control (2015)  Target Ranges:  Prepandial:   less than 140 mg/dL      Peak postprandial:   less than 180 mg/dL (1-2 hours)      Critically ill patients:  140 - 180 mg/dL   Results for Arletha GrippeMOORE, Jocob L (MRN [id number]) as of 03/29/2016 10:30  Ref. Range 03/28/2016 07:25 03/28/2016 11:24 03/28/2016 16:50 03/28/2016 20:41 03/29/2016 07:43  Glucose-Capillary Latest Ref Range: 65 - 99 mg/dL 401174 (H) 027273 (H) 253189 (H) 283 (H) 164 (H)   Review of Glycemic Control  Diabetes history: DM 2 Outpatient Diabetes medications: Metformin 250 mg QAM, 500 mg QPM Current orders for Inpatient glycemic control: Novolog Sensitive TID  Inpatient Diabetes Program Recommendations:   Glucose 283 last night. Please consider ordering Novolog HS scale.  Thanks,  Christena DeemShannon Danilyn Cocke RN, MSN, [facility] Inpatient Diabetes Coordinator Team Pager [phone] (8a-5p)

## 2016-03-30 ENCOUNTER — Inpatient Hospital Stay (HOSPITAL_COMMUNITY): Payer: PPO

## 2016-03-30 DIAGNOSIS — A419 Sepsis, unspecified organism: Secondary | ICD-10-CM | POA: Diagnosis not present

## 2016-03-30 DIAGNOSIS — J690 Pneumonitis due to inhalation of food and vomit: Secondary | ICD-10-CM | POA: Diagnosis not present

## 2016-03-30 DIAGNOSIS — J9690 Respiratory failure, unspecified, unspecified whether with hypoxia or hypercapnia: Secondary | ICD-10-CM | POA: Diagnosis not present

## 2016-03-30 DIAGNOSIS — R1310 Dysphagia, unspecified: Secondary | ICD-10-CM | POA: Diagnosis not present

## 2016-03-30 LAB — CBC
HEMATOCRIT: 40.7 % (ref 39.0–52.0)
Hemoglobin: 12.9 g/dL — ABNORMAL LOW (ref 13.0–17.0)
MCH: 27.4 pg (ref 26.0–34.0)
MCHC: 31.7 g/dL (ref 30.0–36.0)
MCV: 86.4 fL (ref 78.0–100.0)
PLATELETS: 235 10*3/uL (ref 150–400)
RBC: 4.71 MIL/uL (ref 4.22–5.81)
RDW: 13 % (ref 11.5–15.5)
WBC: 9.6 10*3/uL (ref 4.0–10.5)

## 2016-03-30 LAB — BASIC METABOLIC PANEL
Anion gap: 8 (ref 5–15)
BUN: 26 mg/dL — AB (ref 6–20)
CHLORIDE: 99 mmol/L — AB (ref 101–111)
CO2: 33 mmol/L — AB (ref 22–32)
CREATININE: 1.04 mg/dL (ref 0.61–1.24)
Calcium: 9 mg/dL (ref 8.9–10.3)
GFR calc Af Amer: >60 mL/min (ref >60)
GFR calc non Af Amer: >60 mL/min (ref >60)
GLUCOSE: 195 mg/dL — AB (ref 65–99)
Potassium: 4.2 mmol/L (ref 3.5–5.1)
SODIUM: 140 mmol/L (ref 135–145)

## 2016-03-30 LAB — GLUCOSE, CAPILLARY
GLUCOSE-CAPILLARY: 177 mg/dL — AB (ref 65–99)
GLUCOSE-CAPILLARY: 299 mg/dL — AB (ref 65–99)
Glucose-Capillary: 330 mg/dL — ABNORMAL HIGH (ref 65–99)
Glucose-Capillary: 150 mg/dL — ABNORMAL HIGH (ref 65–99)

## 2016-03-30 LAB — CULTURE, BLOOD (ROUTINE X 2)
CULTURE: NO GROWTH
Culture: NO GROWTH

## 2016-03-30 LAB — MAGNESIUM: Magnesium: 1.8 mg/dL (ref 1.7–2.4)

## 2016-03-30 LAB — VANCOMYCIN, TROUGH: VANCOMYCIN TR: 18 ug/mL (ref 15–20)

## 2016-03-30 MED ORDER — MAGNESIUM SULFATE 2 GM/50ML IV SOLN
2.0000 g | Freq: Once | INTRAVENOUS | Status: AC
  Administered 2016-03-30: 2 g via INTRAVENOUS
  Filled 2016-03-30: qty 50

## 2016-03-30 MED ORDER — ACETYLCYSTEINE 20 % IN SOLN
4.0000 mL | Freq: Once | RESPIRATORY_TRACT | Status: AC
  Administered 2016-03-30: 4 mL via RESPIRATORY_TRACT
  Filled 2016-03-30 (×2): qty 4

## 2016-03-30 MED ORDER — WARFARIN - PHARMACIST DOSING INPATIENT: Freq: Every day | Status: DC

## 2016-03-30 MED ORDER — METOLAZONE 5 MG PO TABS
5.0000 mg | ORAL_TABLET | Freq: Once | ORAL | Status: AC
  Administered 2016-03-30: 5 mg via ORAL
  Filled 2016-03-30: qty 1

## 2016-03-30 NOTE — Progress Notes (Signed)
PROGRESS NOTE                                                                                                                                                                                                             Patient Demographics:    George Cook, is a 82 y.o. male, DOB - 17-Jan-1928, JXB:[id number]  Admit date - 03/25/2016   Admitting Physician Clydie Braun, MD  Outpatient Primary MD for the patient is Dwana Melena, MD  LOS - 5  Chief Complaint  Patient presents with  . Shortness of Breath       Brief Narrative George Cook is a 82 y.o. male with medical history significant of dementia, HTN, CAD, COPD, CVA with residual dysphagia, DM type II, and PE on chronic anticoagulation therapy; who presents from nursing facility with complaints of respiratory distress, workup suggested sepsis due to aspiration pneumonia causing acute respiratory failure, Complicated by acute on chronic diastolic CHF.   Subjective:    George Cook today has, No headache, No chest pain, No abdominal pain - No Nausea, No new weakness tingling or numbness, No Cough - SOB. Says no to all questions, He is pleasantly confused however has coarse breath sounds audible without stethoscope.   Assessment  & Plan :     1. Acute hypoxic and hypercapnic respiratory failure due to recurrent microaspiration causing aspiration pneumonia with sepsis Along with acute on chronic diastolic CHF.  Overall prognosis is guarded, speech following on D1 HT diet but clinically he still microaspiration, longterm prognosis is poor, Pall care following as well, his pneumonia and sepsis physiology has resolved and his antibiotics will be transitioned to Augmentin on 03/30/2016, respiratory therapy requested to provide chest PT - Flutter valve. We'll give a dose of Mucomyst to augment mucosal clearing.  Continue treatment for CHF as below. However due to continued  microaspiration his prognosis is extremely guarded. Likely will discharge to SNF with hospice/palliative care once breathing has improved likely in 1-2 days.  2. Acute on chronic diastolic CHF and recent EF 55%. Improving with high-dose Lasix, Zaroxolyn and Nitropaste, echocardiogram noted, continue monitoring BMP and diuresis, no beta blocker due to bradycardia. Once breathing has improved we'll expect discharge to SNF with islet of care/hospice in 1-2 days.  3. History of DVT PE. Switch back to Eliquis which he takes  at home.  4. Dementia with delirium. PRN Haldol. Supportive care, minimize narcotics and benzodiazepines.  5. Mildly elevated troponin due to demand ischemia from #1 above, troponin trend on ACS and flat, chest pain-free, supportive care only for now. Currently not a candidate for invasive testing. Coreg stopped as having episodes of Bradycardia.  6. 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   7. Foley catheter placed for him for it, intake and output monitoring on high-dose of Lasix.   8. DM type II. Sliding scale for now.  Lab Results  Component Value Date   HGBA1C 8.0 (H) 01/06/2016   CBG (last 3)   Recent Labs  03/29/16 1617 03/29/16 2137 03/30/16 0737  GLUCAP 289* 251* 177*      Family Communication  :  None  Code Status :  DNR  Diet : D1 - Honey Thick  Disposition Plan  :  SNF with palliative care likely Wednesday or Thursday  Consults  :  Pall Care  Procedures  :    TTE - Low normal LV systolic function EF 55%; grade 1 diastolic dysfunction; calcified aortic valve with moderate to severe AS (moderate by mean gradient; severe by AVA); mild MR.   DVT Prophylaxis  :  Lovenox   Lab Results  Component Value Date   PLT 235 03/30/2016    Inpatient Medications  Scheduled Meds: . acetylcysteine  4 mL Nebulization Once  . apixaban  5 mg Oral BID  . atorvastatin  10 mg Oral q1800  . donepezil   10 mg Oral QPC supper  . furosemide  80 mg Intravenous BID  . insulin aspart  0-9 Units Subcutaneous TID WC  . metolazone  5 mg Oral Once  . multivitamin with minerals  1 tablet Oral Daily  . nitroGLYCERIN  0.5 inch Topical Q6H  . tamsulosin  0.4 mg Oral QHS  . vitamin B-12  1,000 mcg Oral BID   Continuous Infusions:  PRN Meds:.acetaminophen, haloperidol lactate, hydrALAZINE, ipratropium-albuterol, nitroGLYCERIN, ondansetron (ZOFRAN) IV, RESOURCE THICKENUP CLEAR  Antibiotics  :    Anti-infectives    Start     Dose/Rate Route Frequency Ordered Stop   03/26/16 0900  vancomycin (VANCOCIN) IVPB 750 mg/150 ml premix  Status:  Discontinued     750 mg 150 mL/hr over 60 Minutes Intravenous Every 12 hours 03/25/16 2228 03/30/16 0909   03/26/16 0500  piperacillin-tazobactam (ZOSYN) IVPB 3.375 g  Status:  Discontinued     3.375 g 12.5 mL/hr over 240 Minutes Intravenous Every 8 hours 03/25/16 2228 03/30/16 0909   03/25/16 1945  vancomycin (VANCOCIN) IVPB 1000 mg/200 mL premix     1,000 mg 200 mL/hr over 60 Minutes Intravenous  Once 03/25/16 1930 03/25/16 2125   03/25/16 1945  piperacillin-tazobactam (ZOSYN) IVPB 3.375 g     3.375 g 100 mL/hr over 30 Minutes Intravenous  Once 03/25/16 1930 03/25/16 2125         Objective:   Vitals:   03/29/16 0556 03/29/16 1447 03/29/16 2150 03/30/16 0447  BP: (!) 172/84 (!) 165/76 (!) 181/82 (!) 148/71  Pulse: 74 86  86  Resp: 20 20 20 20   Temp: 98.4 F (36.9 C) 98.7 F (37.1 C) 98.8 F (37.1 C) 97.9 F (36.6 C)  TempSrc: Oral Oral Oral Oral  SpO2: 95% 93% 94% 95%  Weight: 80.4 kg (177 lb 4.8 oz)   81 kg (178 lb 8 oz)  Height:  Wt Readings from Last 3 Encounters:  03/30/16 81 kg (178 lb 8 oz)  02/25/16 92.5 kg (204 lb)  12/25/15 87.1 kg (192 lb)     Intake/Output Summary (Last 24 hours) at 03/30/16 0910 Last data filed at 03/30/16 0300  Gross per 24 hour  Intake              920 ml  Output             2150 ml  Net             -1230 ml     Physical Exam  Awake, pleasantly confused, No new F.N deficits,   [place].AT,PERRAL Supple Neck,No JVD, No cervical lymphadenopathy appriciated.  Symmetrical Chest wall movement, Good air movement bilaterally, Coarse B sounds, +ve rales RRR,No Gallops,Rubs or new Murmurs, No Parasternal Heave +ve B.Sounds, Abd Soft, No tenderness, No organomegaly appriciated, No rebound - guarding or rigidity. No Cyanosis, Clubbing or edema, No new Rash or bruise      Data Review:    CBC  Recent Labs Lab 03/25/16 1856 03/26/16 0338 03/27/16 0426 03/28/16 0557 03/29/16 0616 03/30/16 0759  WBC 16.8* 21.3* 10.8* 7.0 7.0 9.6  HGB 13.6 13.8 13.0 12.4* 12.8* 12.9*  HCT 41.6 41.8 40.2 39.2 39.7 40.7  PLT 271 231 200 186 198 235  MCV 87.8 85.5 87.2 86.7 86.5 86.4  MCH 28.7 28.2 28.2 27.4 27.9 27.4  MCHC 32.7 33.0 32.3 31.6 32.2 31.7  RDW 13.3 13.5 13.6 13.4 13.3 13.0  LYMPHSABS 3.9  --   --   --   --   --   MONOABS 1.0  --   --   --   --   --   EOSABS 0.5  --   --   --   --   --   BASOSABS 0.1  --   --   --   --   --     Chemistries   Recent Labs Lab 03/25/16 1856 03/26/16 0338 03/27/16 0426 03/28/16 0557 03/29/16 0616 03/30/16 0759  NA 135 135 138 140 140 140  K 4.6 4.3 4.1 3.8 3.6 4.2  CL 99* 98* 101 103 101 99*  CO2 27 29 28 31  32 33*  GLUCOSE 261* 88 168* 181* 182* 195*  BUN 23* 28* 25* 22* 21* 26*  CREATININE 0.99 1.24 0.93 0.83 0.92 1.04  CALCIUM 8.7* 8.7* 8.6* 8.5* 8.7* 9.0  MG  --   --   --   --   --  1.8  AST 36  --   --   --   --   --   ALT 16*  --   --   --   --   --   ALKPHOS 54  --   --   --   --   --   BILITOT 0.7  --   --   --   --   --    ------------------------------------------------------------------------------------------------------------------ No results for input(s): CHOL, HDL, LDLCALC, TRIG, CHOLHDL, LDLDIRECT in the last 72 hours.  Lab Results  Component Value Date   HGBA1C 8.0 (H) 01/06/2016    ------------------------------------------------------------------------------------------------------------------ No results for input(s): TSH, T4TOTAL, T3FREE, THYROIDAB in the last 72 hours.  Invalid input(s): FREET3 ------------------------------------------------------------------------------------------------------------------ No results for input(s): VITAMINB12, FOLATE, FERRITIN, TIBC, IRON, RETICCTPCT in the last 72 hours.  Coagulation profile No results for input(s): INR, PROTIME in the last 168 hours.  No results for input(s): DDIMER in the last 72 hours.  Cardiac Enzymes  Recent Labs Lab 03/25/16 2153 03/26/16 0338 03/26/16 0937  TROPONINI 0.06* 0.09* 0.07*   ------------------------------------------------------------------------------------------------------------------    Component Value Date/Time   BNP 577.0 (H) 03/29/2016 0616    Micro Results Recent Results (from the past 240 hour(s))  Blood culture (routine x 2)     Status: None (Preliminary result)   Collection Time: 03/25/16  6:58 PM  Result Value Ref Range Status   Specimen Description BLOOD RIGHT ARM  Final   Special Requests BOTTLES DRAWN AEROBIC AND ANAEROBIC 6CC  Final   Culture NO GROWTH 4 DAYS  Final   Report Status PENDING  Incomplete  Blood culture (routine x 2)     Status: None (Preliminary result)   Collection Time: 03/25/16  7:06 PM  Result Value Ref Range Status   Specimen Description BLOOD RIGHT HAND  Final   Special Requests BOTTLES DRAWN AEROBIC AND ANAEROBIC 6CC  Final   Culture NO GROWTH 4 DAYS  Final   Report Status PENDING  Incomplete  MRSA PCR Screening     Status: None   Collection Time: 03/25/16 10:02 PM  Result Value Ref Range Status   MRSA by PCR NEGATIVE NEGATIVE Final    Comment:        The GeneXpert MRSA Assay (FDA approved for NASAL specimens only), is one component of a comprehensive MRSA colonization surveillance program. It is not intended to diagnose  MRSA infection nor to guide or monitor treatment for MRSA infections.     Radiology Reports Dg Chest Port 1 View  Result Date: 03/29/2016 CLINICAL DATA:  Shortness breath. EXAM: PORTABLE CHEST 1 VIEW COMPARISON:  03/26/2016. FINDINGS: Cardiomegaly with mild bilateral from interstitial prominence. Mild congestive heart failure cannot be excluded. Low lung volumes. Interim slight improvement of right base atelectasis/infiltrate. Calcified pulmonary nodules left upper lobe consistent granulomas Tiny left pleural effusion cannot be excluded. No pneumothorax . IMPRESSION: 1. Mild congestive heart failure cannot be excluded. Tiny left pleural effusion. 2.  Persistent but slightly right base atelectatic changes. Electronically Signed   By: Maisie Fus  Register   On: 03/29/2016 07:46   Dg Chest Port 1 View  Result Date: 03/26/2016 CLINICAL DATA:  Shortness of Breath EXAM: PORTABLE CHEST 1 VIEW COMPARISON:  March 25, 2016 FINDINGS: There is new patchy airspace consolidation in the right base, consistent with focal pneumonia. There is mild atelectasis in the left base. Lungs elsewhere are clear. Heart is mildly enlarged with pulmonary vascularity within normal limits. No adenopathy. There is atherosclerotic calcification in the aorta. IMPRESSION: New airspace consolidation consistent with pneumonia right base. Mild left base atelectasis. Lungs elsewhere clear. Heart mildly enlarged, stable. Aortic atherosclerosis noted. Electronically Signed   By: Bretta Bang III M.D.   On: 03/26/2016 07:42   Dg Chest Portable 1 View  Result Date: 03/25/2016 CLINICAL DATA:  Sudden onset shortness of breath and hypoxia. Dyspnea. EXAM: PORTABLE CHEST 1 VIEW COMPARISON:  05/09/2015. FINDINGS: Interval mild enlargement of the cardiac silhouette with increased prominence of the pulmonary vasculature and interstitial markings. No pleural fluid. Unremarkable bones. IMPRESSION: Interval mild cardiomegaly and mild changes of  acute congestive heart failure. Electronically Signed   By: Beckie Salts M.D.   On: 03/25/2016 19:15    Time Spent in minutes  30   Payson Evrard K M.D on 03/30/2016 at 9:10 AM  Between 7am to 7pm - Pager - [phone]  After 7pm go to [email or web address] - password [facility]  [facility] -  Office  [phone]

## 2016-03-30 NOTE — Progress Notes (Signed)
Nutrition Follow-up   INTERVENTION:  Magic cup TID with meals, each supplement provides 290 kcal and 9 grams of protein   Dysphgia 1 diet with nectar-thick liquids   NUTRITION DIAGNOSIS:  Increased protein and energy needs related to acute (PNA) and chronic illness (multiple chronic conditions) as evidenced by est needs   GOAL:  Meet nutrition needs as able given pt care goals, age and clinical condition    MONITOR:  Respiratory status, Po intake, labs and wt trends     REASON FOR ASSESSMENT:   Low Braden    ASSESSMENT:  [name] Mooreis a 82 y.o.malewith medical history significant ofdementia,HTN, CAD, COPD, CVA with residual dysphagia, DM type II, and PE on chronic anticoagulation therapy; who presents from nursing facility with complaints of respiratory distress, workup suggested sepsis due to aspiration pneumonia causing acute respiratory failure, Complicated by acute on chronic diastolic CHF. Pt condition described as guarded per MD. Palliative team consulted.   [facility] Prior to acute illness pt has maintaining a stable weight between 187-192# for the past 6 months. [facility] Current wt shows significant change which is likely at least partially related to difference in scales.  He has a stage II - perirectal area.  His appetite is fairly good meal intake has ranged 50-100% since admission.     Unable to complete Nutrition-Focused physical exam at this time.   Recent Labs Lab 03/28/16 0557 03/29/16 0616 03/30/16 0759  NA 140 140 140  K 3.8 3.6 4.2  CL 103 101 99*  CO2 31 32 33*  BUN 22* 21* 26*  CREATININE 0.83 0.92 1.04  CALCIUM 8.5* 8.7* 9.0  MG  --   --  1.8  GLUCOSE 181* 182* 195*  Labs:  Glucose 195 mg/dl  CBG (last 3)   Recent Labs  03/29/16 2137 03/30/16 0737 03/30/16 1158  GLUCAP 251* 177* 330*    Medications: Lasix,multivitamin, Vitamin B-12  Diet Order:  DIET - DYS 1 Room service appropriate?  Yes; Fluid consistency: Nectar Thick  Skin:   stage II- perirectal  Last BM:   11/7   Height:   Ht Readings from Last 1 Encounters:  03/25/16 5\' 10"  (1.778 m)    Weight:   Wt Readings from Last 1 Encounters:  03/30/16 178 lb 8 oz (81 kg)    Ideal Body Weight:   75 kg  BMI:  Body mass index is 25.61 kg/m.  Estimated Nutritional Needs:   Kcal:   2025-2268  Protein:   95-105 gr  Fluid:   2.0-2.2 liter daily  EDUCATION NEEDS:  None identified  Royann ShiversLynn Issa Luster MS,RD,CSG,LDN Office: [phone] Pager: [phone]

## 2016-03-30 NOTE — Care Management Note (Signed)
Case Management Note  Patient Details  Name: George Cook MRN: [id number] Date of Birth: 1928-03-07  If discussed at Long Length of Stay Meetings, dates discussed:  03/30/2016  Malcolm Metrohildress, Brendan Gadson Demske, RN 03/30/2016, 10:35 AM

## 2016-03-30 NOTE — Progress Notes (Signed)
Pharmacy Antibiotic Note  Arletha GrippeBilly L Labarbera is a 82 y.o. male admitted on 03/25/2016 with aspiration PNA.  Pharmacy has been consulted for Augmentin  Dosing.   Plan: Augmentin 875mg  po BID Monitor labs, micro and vitals.    Height: 5\' 10"  (177.8 cm) Weight: 178 lb 8 oz (81 kg) IBW/kg (Calculated) : 73  Temp (24hrs), Avg:98.5 F (36.9 C), Min:97.9 F (36.6 C), Max:98.8 F (37.1 C)   Recent Labs Lab 03/25/16 1904 03/25/16 1916 03/26/16 0113 03/26/16 0338 03/27/16 0426 03/28/16 0557 03/29/16 0616 03/30/16 0759  WBC  --   --   --  21.3* 10.8* 7.0 7.0 9.6  CREATININE  --   --   --  1.24 0.93 0.83 0.92 1.04  LATICACIDVEN 2.7* 4.77* 2.9*  --   --   --   --   --   VANCOTROUGH  --   --   --   --   --   --   --  18    Estimated Creatinine Clearance: 52.6 mL/min (by C-G formula based on SCr of 1.04 mg/dL).    Allergies  Allergen Reactions  . Aspirin Hives and Rash   Antimicrobials this admission: Zosyn 11/2 >> 11/7 Vanc 11/2 >> 11/7 Augmentin 11/7>>  Dose adjustments this admission: n/a  Microbiology results: 11/2 BCx: ngtd 11/2 MRSA PCR: (-)  Thank you for allowing pharmacy to be a part of this patient's care.  Elder CyphersLorie Brystol Wasilewski, BS Pharm D, [place] Clinical Pharmacist Pager [phone] 03/30/2016 9:19 AM

## 2016-03-30 NOTE — Progress Notes (Signed)
Inpatient Diabetes Program Recommendations  AACE/ADA: New Consensus Statement on Inpatient Glycemic Control (2015)  Target Ranges:  Prepandial:   less than 140 mg/dL      Peak postprandial:   less than 180 mg/dL (1-2 hours)      Critically ill patients:  140 - 180 mg/dL   Results for George Cook, [name] (MRN [id number]) as of 03/30/2016 08:41  Ref. Range 03/29/2016 07:43 03/29/2016 11:23 03/29/2016 16:17 03/29/2016 21:37 03/30/2016 07:37  Glucose-Capillary Latest Ref Range: 65 - 99 mg/dL 409164 (H) 811234 (H) 914289 (H) 251 (H) 177 (H)  Results for George Cook, George Cook (MRN [id number]) as of 03/30/2016 08:41  Ref. Range 03/28/2016 07:25 03/28/2016 11:24 03/28/2016 16:50 03/28/2016 20:41  Glucose-Capillary Latest Ref Range: 65 - 99 mg/dL 086174 (H) 578273 (H) 469189 (H) 283 (H)   Review of Glycemic Control  Diabetes history: DM2 Outpatient Diabetes medications: Metformin 250 mg QAM, Metformin 500 mg QPM Current orders for Inpatient glycemic control: Novolog 0-9 units TID with meals  Inpatient Diabetes Program Recommendations: Correction (SSI): Please consider ordering Novolog bedtime correction scale. Insulin - Meal Coverage: Post prandial glucose is consistently elevated. Please consider ordering Novolog 4 units TID with meals for meal coverage if patient eats at least 50% of meals.  Thanks, Orlando PennerMarie Farouk Vivero, RN, MSN, CDE Diabetes Coordinator Inpatient Diabetes Program [phone] (Team Pager from 8am to 5pm)

## 2016-03-31 DIAGNOSIS — R1310 Dysphagia, unspecified: Secondary | ICD-10-CM | POA: Diagnosis not present

## 2016-03-31 DIAGNOSIS — I10 Essential (primary) hypertension: Secondary | ICD-10-CM | POA: Diagnosis not present

## 2016-03-31 DIAGNOSIS — J690 Pneumonitis due to inhalation of food and vomit: Secondary | ICD-10-CM | POA: Diagnosis not present

## 2016-03-31 LAB — GLUCOSE, CAPILLARY
GLUCOSE-CAPILLARY: 330 mg/dL — AB (ref 65–99)
Glucose-Capillary: 176 mg/dL — ABNORMAL HIGH (ref 65–99)

## 2016-03-31 LAB — BASIC METABOLIC PANEL
Anion gap: 9 (ref 5–15)
BUN: 31 mg/dL — AB (ref 6–20)
CHLORIDE: 97 mmol/L — AB (ref 101–111)
CO2: 33 mmol/L — ABNORMAL HIGH (ref 22–32)
CREATININE: 0.95 mg/dL (ref 0.61–1.24)
Calcium: 8.8 mg/dL — ABNORMAL LOW (ref 8.9–10.3)
GFR calc Af Amer: >60 mL/min (ref >60)
GFR calc non Af Amer: >60 mL/min (ref >60)
GLUCOSE: 190 mg/dL — AB (ref 65–99)
POTASSIUM: 3.7 mmol/L (ref 3.5–5.1)
SODIUM: 139 mmol/L (ref 135–145)

## 2016-03-31 LAB — CBC
HCT: 40.6 % (ref 39.0–52.0)
HEMOGLOBIN: 13.5 g/dL (ref 13.0–17.0)
MCH: 28.7 pg (ref 26.0–34.0)
MCHC: 33.3 g/dL (ref 30.0–36.0)
MCV: 86.4 fL (ref 78.0–100.0)
PLATELETS: 239 10*3/uL (ref 150–400)
RBC: 4.7 MIL/uL (ref 4.22–5.81)
RDW: 13.1 % (ref 11.5–15.5)
WBC: 10.7 10*3/uL — ABNORMAL HIGH (ref 4.0–10.5)

## 2016-03-31 LAB — MAGNESIUM: MAGNESIUM: 2.3 mg/dL (ref 1.7–2.4)

## 2016-03-31 MED ORDER — AMOXICILLIN-POT CLAVULANATE 875-125 MG PO TABS: 1.0000 | ORAL_TABLET | Freq: Two times a day (BID) | ORAL | 0 refills | Status: DC

## 2016-03-31 MED ORDER — FUROSEMIDE 40 MG PO TABS: 40.0000 mg | ORAL_TABLET | Freq: Two times a day (BID) | ORAL | 0 refills | Status: AC

## 2016-03-31 MED ORDER — FUROSEMIDE 40 MG PO TABS
40.0000 mg | ORAL_TABLET | Freq: Two times a day (BID) | ORAL | Status: DC
  Filled 2016-03-31: qty 1

## 2016-03-31 MED ORDER — AMOXICILLIN-POT CLAVULANATE 875-125 MG PO TABS: 1.0000 | ORAL_TABLET | Freq: Two times a day (BID) | ORAL | Status: DC

## 2016-03-31 NOTE — Progress Notes (Signed)
ANTICOAGULATION CONSULT NOTE  Pharmacy Consult for Eliquis (was on eliquis PTA) Indication: Hx pulmonary embolus  Allergies  Allergen Reactions  . Aspirin Hives and Rash   Patient Measurements: Height: 5\' 10"  (177.8 cm) Weight: 175 lb 1.6 oz (79.4 kg) IBW/kg (Calculated) : 73 HEPARIN DW (KG): 83.8  Vital Signs: Temp: 98.4 F (36.9 C) (11/08 0621) Temp Source: Oral (11/08 0621) BP: 133/67 (11/08 0621) Pulse Rate: 69 (11/08 0621)  Labs:  Recent Labs  03/29/16 0616 03/30/16 0759 03/31/16 0605  HGB 12.8* 12.9* 13.5  HCT 39.7 40.7 40.6  PLT 198 235 239  CREATININE 0.92 1.04 0.95   Estimated Creatinine Clearance: 57.6 mL/min (by C-G formula based on SCr of 0.95 mg/dL).  Medical History: Past Medical History:  Diagnosis Date  . Carotid artery occlusion    a. 05/2012 U/S Bilat ICA 20-39%.  Marland Kitchen. COPD (chronic obstructive pulmonary disease) (HCC)   . DM (diabetes mellitus) (HCC)   . Hiatal hernia   . HTN (hypertension)   . Macular degeneration   . Nephrolithiasis   . Obesity   . Pulmonary emboli (HCC)   . Stroke [facility](HCC)    a. 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 01/2009 TEE: EF 60-65%, mild MR, no LA/LAA thrombus.   Medications:  Prescriptions Prior to Admission  Medication Sig Dispense Refill Last Dose  . albuterol (PROVENTIL) (2.5 MG/3ML) 0.083% nebulizer solution 1 inhalation four times a day 8 am, 12  Pm, 4 pm, 9 pm for wheezing and SOB   03/25/2016 at Unknown time  . apixaban (ELIQUIS) 5 MG TABS tablet Take 5 mg by mouth 2 (two) times daily.   03/25/2016 at 0830  . Ascorbic Acid (VITAMIN C) 1000 MG tablet Take 1,500 mg by mouth daily.    03/25/2016 at Unknown time  . atorvastatin (LIPITOR) 10 MG tablet Take 10 mg by mouth daily.   03/25/2016 at Unknown time  . carvedilol (COREG) 3.125 MG tablet TAKE 1 TABLET BY MOUTH TWICE DAILY WITH MEALS. 180 tablet 3 03/25/2016 at 1700  . Cholecalciferol (VITAMIN D) 2000 UNITS CAPS Take 1 capsule by mouth daily after supper.    03/25/2016 at Unknown time  . Cyanocobalamin (B-12) 1000 MCG TBCR Take 1 tablet by mouth 2 (two) times daily.   03/25/2016 at Unknown time  . escitalopram (LEXAPRO) 5 MG tablet Take 5 mg by mouth every evening.   03/25/2016 at Unknown time  . furosemide (LASIX) 20 MG tablet Take 20 mg by mouth daily.   03/25/2016 at Unknown time  . lisinopril (PRINIVIL,ZESTRIL) 5 MG tablet Take 1 tablet (5 mg total) by mouth daily. 30 tablet 3 03/25/2016 at Unknown time  . metFORMIN (GLUCOPHAGE) 500 MG tablet Take 500 mg by mouth every evening. Along with 250 mg to equal 750 mg   03/25/2016 at Unknown time  . metFORMIN (GLUCOPHAGE) 500 MG tablet Take 250 mg by mouth daily with breakfast. Take with 500mg  to equal 750mg    03/25/2016 at Unknown time  . Multiple Vitamin (MULTIVITAMIN WITH MINERALS) TABS tablet Take 1 tablet by mouth daily.   03/25/2016 at Unknown time  . donepezil (ARICEPT) 10 MG tablet Take 10 mg by mouth daily after supper.    03/24/2016  . LORazepam (ATIVAN) 0.5 MG tablet Take 0.25 mg by mouth 2 (two) times daily.    03/24/2016  . nitroGLYCERIN (NITROSTAT) 0.4 MG SL tablet Place 1 tablet (0.4 mg total) under the tongue every 5 (five) minutes as needed for chest pain. 25 tablet 3  unknown  . tamsulosin (FLOMAX) 0.4 MG CAPS capsule Take 0.4 mg by mouth at bedtime.   03/24/2016  . traMADol (ULTRAM) 50 MG tablet Take 1 tablet (50 mg total) by mouth 3 (three) times daily as needed for moderate pain. For pain 20 tablet 0 03/24/2016   Assessment: Had been on SQ Lovenox for Hx PE.  Pt was on Apixaban PTA: dose = 5mg  BID which is appropriate. No bleeding noted.  CBC appears stable. Weight > 60Kg  Goal of Therapy:  Anticoagulation with Eliquis Monitor platelets by anticoagulation protocol: Yes   Plan:  Eliquis 5mg  PO bid Monitor for signs and symptoms of bleeding.   [name], Hershey Knauer A 03/31/2016,11:52 AM

## 2016-03-31 NOTE — Clinical Social Work Note (Signed)
[facility], and facility aware and agreeable. Will transfer with staff.  Derenda FennelKara Arisbel Maione, LCSW [phone]

## 2016-03-31 NOTE — Care Management Important Message (Signed)
Important Message  Patient Details  Name: George GrippeBilly L Cook MRN: [id number] Date of Birth: 12-22-1927   Medicare Important Message Given:  Yes    Anahlia Iseminger, Chrystine OilerSharley Diane, RN 03/31/2016, 10:48 AM

## 2016-03-31 NOTE — Progress Notes (Addendum)
Called report to Chip BoerVicki RN  regarding pt's return to facility. Pt information and education explained. Packet sent with patient. Pt taken back to [facility] by staff member. VSS IV catheter taken out. Foley D/C. Condom cath placement per MD. Lesly Dukesachel J Everett, RN

## 2016-03-31 NOTE — Clinical Social Work Note (Signed)
CSW provided status update to Keri at [facility].          Osmond Steckman D, LCSW  

## 2016-03-31 NOTE — Progress Notes (Signed)
Inpatient Diabetes Program Recommendations  AACE/ADA: New Consensus Statement on Inpatient Glycemic Control (2015)  Target Ranges:  Prepandial:   less than 140 mg/dL      Peak postprandial:   less than 180 mg/dL (1-2 hours)      Critically ill patients:  140 - 180 mg/dL  Results for Arletha GrippeMOORE, Jahni L (MRN [id number]) as of 03/31/2016 08:18  Ref. Range 03/30/2016 07:37 03/30/2016 11:58 03/30/2016 15:58 03/30/2016 21:44 03/31/2016 07:48  Glucose-Capillary Latest Ref Range: 65 - 99 mg/dL 295177 (H) 284330 (H) 132299 (H) 150 (H) 176 (H)  Results for Arletha GrippeMOORE, Braxtyn L (MRN [id number]) as of 03/31/2016 08:18  Ref. Range 03/29/2016 07:43 03/29/2016 11:23 03/29/2016 16:17 03/29/2016 21:37  Glucose-Capillary Latest Ref Range: 65 - 99 mg/dL 366164 (H) 440234 (H) 347289 (H) 251 (H)    Review of Glycemic Control  Diabetes history: DM2 Outpatient Diabetes medications: Metformin 250 mg QAM, Metformin 500 mg QPM Current orders for Inpatient glycemic control: Novolog 0-9 units TID with meals  Inpatient Diabetes Program Recommendations: Correction (SSI): Please consider ordering Novolog bedtime correction scale. Insulin - Meal Coverage: Post prandial glucose is consistently elevated. Please consider ordering Novolog 4 units TID with meals for meal coverage if patient eats at least 50% of meals.  Thanks, Orlando PennerMarie Tecumseh Yeagley, RN, MSN, CDE Diabetes Coordinator Inpatient Diabetes Program [phone] (Team Pager from 8am to 5pm)

## 2016-03-31 NOTE — Discharge Summary (Signed)
Physician Discharge Summary  George Cook:[id number] DOB: 1928/05/10 DOA: 03/25/2016  PCP: Dwana Melena, MD  Admit date: 03/25/2016 Discharge date: 03/31/2016  Admitted From: [facility] Disposition:  [facility]  Recommendations for Outpatient Follow-up:  1. Follow up with PCP in 1-2 weeks 2. Please obtain BMP in one week 3. Please consult Palliative Care 4. Patient to complete PO augmentin on 04/02/16 5. Please weigh patient daily. Monitor volume status carefully to avoid volume overload vs dehydration   Discharge Condition:Stable CODE STATUS:DNR Diet recommendation: Heart healthy, diabetic   Brief/Interim Summary: 82 y.o.malewith medical history significant ofdementia,HTN, CAD, COPD, CVA with residual dysphagia, DM type II, and PE on chronic anticoagulation therapy; who presents from nursing facility with complaints of respiratory distress, workup suggested sepsis due to aspiration pneumonia causing acute respiratory failure, Complicated by acute on chronic diastolic CHF.  1. Acute hypoxic and hypercapnic respiratory failure due to recurrent microaspiration causing aspiration pneumonia with sepsis Along with acute on chronic diastolic CHF.  Overall prognosis is guarded, speech following on D1 HT diet but clinically he still microaspiration, longterm prognosis is poor, Pall care following as well, his pneumonia and sepsis physiology has resolved and his antibiotics will be transitioned to Augmentin on 03/30/2016, respiratory therapy requested to provide chest PT - Flutter valve. We'll give a dose of Mucomyst to augment mucosal clearing.  Continue treatment for CHF as below. However due to continued microaspiration his prognosis is extremely guarded.  2. Acute on chronic diastolic CHF and recent EF 55%. Improving with high-dose Lasix, Zaroxolyn and Nitropaste, echocardiogram noted, continue monitoring BMP and diuresis, no beta blocker due to bradycardia. .  3.  History of DVT PE. Resumed Eliquis which he takes prior to admission.  4. Dementia with delirium. PRN Haldol while admitted. Supportive care, minimize narcotics and benzodiazepines.  5. Mildly elevated troponin due to demand ischemia from #1 above, troponin trend on ACS and flat, chest pain-free, supportive care only for now. Currently not a candidate for invasive testing. Coreg stopped as having episodes of Bradycardia.  6. 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   7. Foley catheter placed for him for it, intake and output monitoring on high-dose of Lasix.   8. DM type II. Sliding scale for now.  Discharge Diagnoses:  Principal Problem:   Acute respiratory failure (HCC) Active Problems:   Essential hypertension   Pulmonary embolism, bilateral (HCC)   Type 2 diabetes mellitus (HCC)   Right leg DVT (HCC)   Pulmonary edema   Dysphagia   SIRS (systemic inflammatory response syndrome) (HCC)   Hypotension   Aspiration pneumonia due to regurgitated food [facility])   Palliative care encounter   DNR (do not resuscitate) discussion   Goals of care, counseling/discussion    Discharge Instructions     Medication List    STOP taking these medications   carvedilol 3.125 MG tablet Commonly known as:  COREG     TAKE these medications   albuterol (2.5 MG/3ML) 0.083% nebulizer solution Commonly known as:  PROVENTIL 1 inhalation four times a day 8 am, 12  Pm, 4 pm, 9 pm for wheezing and SOB   amoxicillin-clavulanate 875-125 MG tablet Commonly known as:  AUGMENTIN Take 1 tablet by mouth every 12 (twelve) hours.   atorvastatin 10 MG tablet Commonly known as:  LIPITOR Take 10 mg by mouth daily.   B-12 1000 MCG Tbcr Take 1 tablet by mouth 2 (two) times daily.  donepezil 10 MG tablet Commonly known as:  ARICEPT Take 10 mg by mouth daily after supper.   ELIQUIS 5 MG Tabs tablet Generic drug:  apixaban Take 5 mg by mouth  2 (two) times daily.   escitalopram 5 MG tablet Commonly known as:  LEXAPRO Take 5 mg by mouth every evening.   FLOMAX 0.4 MG Caps capsule Generic drug:  tamsulosin Take 0.4 mg by mouth at bedtime.   furosemide 40 MG tablet Commonly known as:  LASIX Take 1 tablet (40 mg total) by mouth 2 (two) times daily. What changed:  medication strength  how much to take  when to take this   lisinopril 5 MG tablet Commonly known as:  PRINIVIL,ZESTRIL Take 1 tablet (5 mg total) by mouth daily.   LORazepam 0.5 MG tablet Commonly known as:  ATIVAN Take 0.25 mg by mouth 2 (two) times daily.   metFORMIN 500 MG tablet Commonly known as:  GLUCOPHAGE Take 500 mg by mouth every evening. Along with 250 mg to equal 750 mg   metFORMIN 500 MG tablet Commonly known as:  GLUCOPHAGE Take 250 mg by mouth daily with breakfast. Take with 500mg  to equal 750mg    multivitamin with minerals Tabs tablet Take 1 tablet by mouth daily.   nitroGLYCERIN 0.4 MG SL tablet Commonly known as:  NITROSTAT Place 1 tablet (0.4 mg total) under the tongue every 5 (five) minutes as needed for chest pain.   traMADol 50 MG tablet Commonly known as:  ULTRAM Take 1 tablet (50 mg total) by mouth 3 (three) times daily as needed for moderate pain. For pain   vitamin C 1000 MG tablet Take 1,500 mg by mouth daily.   Vitamin D 2000 units Caps Take 1 capsule by mouth daily after supper.      Follow-up Information    [facility] EMERGENCY DEPARTMENT Follow up.   Specialty:  Emergency Medicine Why:  If symptoms worsen Contact information: [address] [id number] [place] [address] [phone]       Dwana Melena, MD. Schedule an appointment as soon as possible for a visit in 1 week(s).   Specialty:  Internal Medicine Contact information: [address] [place] [address] [phone]          Allergies  Allergen Reactions  . Aspirin Hives and Rash     Consultations:  Palliative Care  Procedures/Studies: Dg Chest Port 1 View  Result Date: 03/30/2016 CLINICAL DATA:  Sepsis, aspiration pneumonia and respiratory failure. EXAM: PORTABLE CHEST 1 VIEW COMPARISON:  03/29/2016 FINDINGS: Stable bibasilar atelectasis versus infiltrates. Stable potential mild interstitial edema. Stable top-normal heart size. No significant pleural effusions identified on the chest x-ray today. IMPRESSION: Bibasilar atelectasis versus infiltrates have a stable appearance. Stable potential mild interstitial edema. Electronically Signed   By: Irish Lack M.D.   On: 03/30/2016 09:18   Dg Chest Port 1 View  Result Date: 03/29/2016 CLINICAL DATA:  Shortness breath. EXAM: PORTABLE CHEST 1 VIEW COMPARISON:  03/26/2016. FINDINGS: Cardiomegaly with mild bilateral from interstitial prominence. Mild congestive heart failure cannot be excluded. Low lung volumes. Interim slight improvement of right base atelectasis/infiltrate. Calcified pulmonary nodules left upper lobe consistent granulomas Tiny left pleural effusion cannot be excluded. No pneumothorax . IMPRESSION: 1. Mild congestive heart failure cannot be excluded. Tiny left pleural effusion. 2.  Persistent but slightly right base atelectatic changes. Electronically Signed   By: Maisie Fus  Register   On: 03/29/2016 07:46   Dg Chest Port 1 View  Result  Date: 03/26/2016 CLINICAL DATA:  Shortness of Breath EXAM: PORTABLE CHEST 1 VIEW COMPARISON:  March 25, 2016 FINDINGS: There is new patchy airspace consolidation in the right base, consistent with focal pneumonia. There is mild atelectasis in the left base. Lungs elsewhere are clear. Heart is mildly enlarged with pulmonary vascularity within normal limits. No adenopathy. There is atherosclerotic calcification in the aorta. IMPRESSION: New airspace consolidation consistent with pneumonia right base. Mild left base atelectasis. Lungs elsewhere clear. Heart mildly enlarged, stable.  Aortic atherosclerosis noted. Electronically Signed   By: Bretta BangWilliam  Woodruff III M.D.   On: 03/26/2016 07:42   Dg Chest Portable 1 View  Result Date: 03/25/2016 CLINICAL DATA:  Sudden onset shortness of breath and hypoxia. Dyspnea. EXAM: PORTABLE CHEST 1 VIEW COMPARISON:  05/09/2015. FINDINGS: Interval mild enlargement of the cardiac silhouette with increased prominence of the pulmonary vasculature and interstitial markings. No pleural fluid. Unremarkable bones. IMPRESSION: Interval mild cardiomegaly and mild changes of acute congestive heart failure. Electronically Signed   By: Beckie SaltsSteven  Reid M.D.   On: 03/25/2016 19:15    Subjective: No complaints  Discharge Exam: Vitals:   03/30/16 2151 03/31/16 0621  BP: 138/68 133/67  Pulse: 80 69  Resp: 15 14  Temp: 99.1 F (37.3 C) 98.4 F (36.9 C)   Vitals:   03/30/16 1129 03/30/16 1431 03/30/16 2151 03/31/16 0621  BP:  (!) 166/87 138/68 133/67  Pulse:  89 80 69  Resp:  20 15 14   Temp:  97.7 F (36.5 C) 99.1 F (37.3 C) 98.4 F (36.9 C)  TempSrc:  Oral Oral Oral  SpO2: 98% 94% 95% 99%  Weight:    79.4 kg (175 lb 1.6 oz)  Height:        General: Pt is alert, awake, not in acute distress Cardiovascular: RRR, S1/S2 +, no rubs, no gallops Respiratory: CTA bilaterally, no wheezing, no rhonchi Abdominal: Soft, NT, ND, bowel sounds + Extremities: no edema, no cyanosis   The results of significant diagnostics from this hospitalization (including imaging, microbiology, ancillary and laboratory) are listed below for reference.     Microbiology: Recent Results (from the past 240 hour(s))  Blood culture (routine x 2)     Status: None   Collection Time: 03/25/16  6:58 PM  Result Value Ref Range Status   Specimen Description BLOOD RIGHT ARM  Final   Special Requests BOTTLES DRAWN AEROBIC AND ANAEROBIC 6CC  Final   Culture NO GROWTH 5 DAYS  Final   Report Status 03/30/2016 FINAL  Final  Blood culture (routine x 2)     Status: None    Collection Time: 03/25/16  7:06 PM  Result Value Ref Range Status   Specimen Description BLOOD RIGHT HAND  Final   Special Requests BOTTLES DRAWN AEROBIC AND ANAEROBIC 6CC  Final   Culture NO GROWTH 5 DAYS  Final   Report Status 03/30/2016 FINAL  Final  MRSA PCR Screening     Status: None   Collection Time: 03/25/16 10:02 PM  Result Value Ref Range Status   MRSA by PCR NEGATIVE NEGATIVE Final    Comment:        The GeneXpert MRSA Assay (FDA approved for NASAL specimens only), is one component of a comprehensive MRSA colonization surveillance program. It is not intended to diagnose MRSA infection nor to guide or monitor treatment for MRSA infections.      Labs: BNP (last 3 results)  Recent Labs  05/10/15 0540 03/25/16 1856 03/29/16 0616  BNP 165.0* 340.0*  577.0*   Basic Metabolic Panel:  Recent Labs Lab 03/27/16 0426 03/28/16 0557 03/29/16 0616 03/30/16 0759 03/31/16 0605  NA 138 140 140 140 139  K 4.1 3.8 3.6 4.2 3.7  CL 101 103 101 99* 97*  CO2 28 31 32 33* 33*  GLUCOSE 168* 181* 182* 195* 190*  BUN 25* 22* 21* 26* 31*  CREATININE 0.93 0.83 0.92 1.04 0.95  CALCIUM 8.6* 8.5* 8.7* 9.0 8.8*  MG  --   --   --  1.8 2.3   Liver Function Tests:  Recent Labs Lab 03/25/16 1856  AST 36  ALT 16*  ALKPHOS 54  BILITOT 0.7  PROT 6.8  ALBUMIN 3.5   No results for input(s): LIPASE, AMYLASE in the last 168 hours. No results for input(s): AMMONIA in the last 168 hours. CBC:  Recent Labs Lab 03/25/16 1856  03/27/16 0426 03/28/16 0557 03/29/16 0616 03/30/16 0759 03/31/16 0605  WBC 16.8*  < > 10.8* 7.0 7.0 9.6 10.7*  NEUTROABS 11.3*  --   --   --   --   --   --   HGB 13.6  < > 13.0 12.4* 12.8* 12.9* 13.5  HCT 41.6  < > 40.2 39.2 39.7 40.7 40.6  MCV 87.8  < > 87.2 86.7 86.5 86.4 86.4  PLT 271  < > 200 186 198 235 239  < > = values in this interval not displayed. Cardiac Enzymes:  Recent Labs Lab 03/25/16 2153 03/26/16 0338 03/26/16 0937  TROPONINI  0.06* 0.09* 0.07*   BNP: Invalid input(s): POCBNP CBG:  Recent Labs Lab 03/30/16 1158 03/30/16 1558 03/30/16 2144 03/31/16 0748 03/31/16 1202  GLUCAP 330* 299* 150* 176* 330*   D-Dimer No results for input(s): DDIMER in the last 72 hours. Hgb A1c No results for input(s): HGBA1C in the last 72 hours. Lipid Profile No results for input(s): CHOL, HDL, LDLCALC, TRIG, CHOLHDL, LDLDIRECT in the last 72 hours. Thyroid function studies No results for input(s): TSH, T4TOTAL, T3FREE, THYROIDAB in the last 72 hours.  Invalid input(s): FREET3 Anemia work up No results for input(s): VITAMINB12, FOLATE, FERRITIN, TIBC, IRON, RETICCTPCT in the last 72 hours. Urinalysis    Component Value Date/Time   COLORURINE YELLOW 03/26/2016 0945   APPEARANCEUR HAZY (A) 03/26/2016 0945   LABSPEC 1.025 03/26/2016 0945   PHURINE 5.0 03/26/2016 0945   GLUCOSEU NEGATIVE 03/26/2016 0945   HGBUR LARGE (A) 03/26/2016 0945   BILIRUBINUR NEGATIVE 03/26/2016 0945   KETONESUR NEGATIVE 03/26/2016 0945   PROTEINUR 30 (A) 03/26/2016 0945   UROBILINOGEN 0.2 03/24/2015 0030   NITRITE NEGATIVE 03/26/2016 0945   LEUKOCYTESUR TRACE (A) 03/26/2016 0945   Sepsis Labs Invalid input(s): PROCALCITONIN,  WBC,  LACTICIDVEN Microbiology Recent Results (from the past 240 hour(s))  Blood culture (routine x 2)     Status: None   Collection Time: 03/25/16  6:58 PM  Result Value Ref Range Status   Specimen Description BLOOD RIGHT ARM  Final   Special Requests BOTTLES DRAWN AEROBIC AND ANAEROBIC 6CC  Final   Culture NO GROWTH 5 DAYS  Final   Report Status 03/30/2016 FINAL  Final  Blood culture (routine x 2)     Status: None   Collection Time: 03/25/16  7:06 PM  Result Value Ref Range Status   Specimen Description BLOOD RIGHT HAND  Final   Special Requests BOTTLES DRAWN AEROBIC AND ANAEROBIC 6CC  Final   Culture NO GROWTH 5 DAYS  Final   Report Status 03/30/2016 FINAL  Final  MRSA PCR Screening     Status: None    Collection Time: 03/25/16 10:02 PM  Result Value Ref Range Status   MRSA by PCR NEGATIVE NEGATIVE Final    Comment:        The GeneXpert MRSA Assay (FDA approved for NASAL specimens only), is one component of a comprehensive MRSA colonization surveillance program. It is not intended to diagnose MRSA infection nor to guide or monitor treatment for MRSA infections.      SIGNED:   Jerald Kief, MD  [facility] 03/31/2016, 2:05 PM  If 7PM-7AM, please contact night-coverage [email or web address] Password [email or web address]

## 2016-04-01 ENCOUNTER — Non-Acute Institutional Stay (SKILLED_NURSING_FACILITY): Payer: PPO | Admitting: Internal Medicine

## 2016-04-01 ENCOUNTER — Encounter: Payer: Self-pay | Admitting: Internal Medicine

## 2016-04-01 DIAGNOSIS — I10 Essential (primary) hypertension: Secondary | ICD-10-CM

## 2016-04-01 DIAGNOSIS — I5030 Unspecified diastolic (congestive) heart failure: Secondary | ICD-10-CM | POA: Insufficient documentation

## 2016-04-01 DIAGNOSIS — I2699 Other pulmonary embolism without acute cor pulmonale: Secondary | ICD-10-CM

## 2016-04-01 DIAGNOSIS — I5033 Acute on chronic diastolic (congestive) heart failure: Secondary | ICD-10-CM

## 2016-04-01 DIAGNOSIS — J42 Unspecified chronic bronchitis: Secondary | ICD-10-CM | POA: Diagnosis not present

## 2016-04-01 DIAGNOSIS — I350 Nonrheumatic aortic (valve) stenosis: Secondary | ICD-10-CM | POA: Diagnosis not present

## 2016-04-01 DIAGNOSIS — J690 Pneumonitis due to inhalation of food and vomit: Secondary | ICD-10-CM | POA: Diagnosis not present

## 2016-04-01 DIAGNOSIS — E1159 Type 2 diabetes mellitus with other circulatory complications: Secondary | ICD-10-CM

## 2016-04-01 NOTE — Progress Notes (Signed)
Provider:  Einar CrowAnjali,Jlyn Cerros Location:   [facility] Room Number: 139/D Place of Service:  SNF (31)  PCP: Mahlon GammonAnjali L Mckenze Slone, MD Patient Care Team: Mahlon GammonAnjali L Lucio Litsey, MD as PCP - General (Internal Medicine) Roma KayserGebreselassie W Nida, MD (Endocrinology) Laqueta LindenSuresh A Koneswaran, MD as Attending Physician (Cardiology)  Extended Emergency Contact Information Primary Emergency Contact: Horace,Doris J Address: [address]          [place], [place] [address] [place] Home Phone: [phone] Relation: Spouse Secondary Emergency Contact: Gildner,Jason Address: [address]           [place], [place] [address] [place] Home Phone: [phone] Mobile Phone: [phone] Relation: Son  Code Status: DNR Goals of Care: Advanced Directive information Advanced Directives 04/01/2016  Does patient have an advance directive? Yes  Type of Advance Directive Out of facility DNR (pink MOST or yellow form)  Does patient want to make changes to advanced directive? No - Patient declined  Copy of advanced directive(s) in chart? Yes  Would patient like information on creating an advanced directive? -  Pre-existing out of facility DNR order (yellow form or pink MOST form) -      Chief Complaint  Patient presents with  . Readmit To SNF    HPI: Patient is a 82 y.o. male seen today for Readmission to the facility. Patient is LTC resident of SNF and has  H/O Hypertension, CAD, S/P multiple infarcts, CHF and COPD. He had been pretty stable in the facility but had episode of acute respiratory distress. He was found to have aspiration pneumonia in the hospital with CHF. He was started on Antibiotics and was diuresed aggressively. He was Also evaluated by speech therapist and is thought that he is going to have Micro aspirations. But he is on Modified diet. But it  seems he is Non Compliant with diet.  Palliative care was also d/w family. Family does understand that his prognosis stays  guarded and he can continue to have aspirations with pneumonia. He was eventually started on PO Augmentin. His Coreg was stopped due to Bradycardia. Patient did require some haldol for his delirium. His Echo in the hospital showed EF of 50-55 % and has moderate to severe Aortic stenosis. Patient is not C/O any Sob or cough but his wife says he is stll very SOB and coughing.  Past Medical History:  Diagnosis Date  . Carotid artery occlusion    a. 05/2012 U/S Bilat ICA 20-39%.  Marland Kitchen. COPD (chronic obstructive pulmonary disease) (HCC)   . DM (diabetes mellitus) (HCC)   . Hiatal hernia   . HTN (hypertension)   . Macular degeneration   . Nephrolithiasis   . Obesity   . Pulmonary emboli (HCC)   . Stroke [facility](HCC)    a. 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 01/2009 TEE: EF 60-65%, mild MR, no LA/LAA thrombus.   Past Surgical History:  Procedure Laterality Date  . CATARACT EXTRACTION W/PHACO Right 10/02/2012   Procedure: CATARACT EXTRACTION PHACO AND INTRAOCULAR LENS PLACEMENT (IOC);  Surgeon: Gemma PayorKerry Hunt, MD;  Location: [facility];  Service: Ophthalmology;  Laterality: Right;  CDE 12.78  . CATARACT EXTRACTION W/PHACO Left 10/30/2012   Procedure: CATARACT EXTRACTION PHACO AND INTRAOCULAR LENS PLACEMENT (IOC);  Surgeon: Gemma PayorKerry Hunt, MD;  Location: [facility];  Service: Ophthalmology;  Laterality: Left;  CDE: 19.10  . EYE SURGERY    . FINGER SURGERY Left    pinky, after saw cut it off  . LEFT HEART  CATHETERIZATION WITH CORONARY ANGIOGRAM Bilateral 03/08/2013   Procedure: LEFT HEART CATHETERIZATION WITH CORONARY ANGIOGRAM;  Surgeon: Laurey Morale, MD;  Location: [facility] CATH LAB;  Service: Cardiovascular;  Laterality: Bilateral;  . TEE WITHOUT CARDIOVERSION N/A 12/16/2014   Procedure: TRANSESOPHAGEAL ECHOCARDIOGRAM (TEE);  Surgeon: Antoine Poche, MD;  Location: [facility] ENDO SU[facility];  Service: Cardiology;  Laterality: N/A;  will need 40 minutes to clean scope between procedures    reports that he quit smoking about 24  years ago. His smoking use included Cigarettes. He started smoking about 66 years ago. He has a 120.00 pack-year smoking history. He has never used smokeless tobacco. He reports that he does not drink alcohol or use drugs. Social History   Social History  . Marital status: Married    Spouse name: N/A  . Number of children: N/A  . Years of education: N/A   Occupational History  . Not on file.   Social History Main Topics  . Smoking status: Former Smoker    Packs/day: 3.00    Years: 40.00    Types: Cigarettes    Start date: 05/24/1949    Quit date: 05/25/1991  . Smokeless tobacco: Never Used  . Alcohol use No  . Drug use: No  . Sexual activity: Not on file   Other Topics Concern  . Not on file   Social History Narrative   Lives in [place] with wife.  Sedentary.  Does not exercise.  Spends most of the day in his recliner.    Functional Status Survey:    Family History  Problem Relation Age of Onset  . Diabetes Mother   . Stroke Mother   . Emphysema Mother   . Hypertension Mother   . Coronary artery disease Father   . Heart disease Father     Beefore age 29  . Stroke Sister   . Diabetes Sister   . Hypertension Sister   . Coronary artery disease Brother   . Heart disease Brother     Before age 24- CABG    Health Maintenance  Topic Date Due  . FOOT EXAM  10/27/2016 (Originally 02/05/1938)  . OPHTHALMOLOGY EXAM  10/27/2016 (Originally 02/05/1938)  . ZOSTAVAX  10/27/2016 (Originally 02/04/1990)  . TETANUS/TDAP  10/27/2025 (Originally 02/04/1947)  . HEMOGLOBIN A1C  07/08/2016  . PNA vac Low Risk Adult (2 of 2 - PCV13) 03/03/2017  . INFLUENZA VACCINE  Completed    Allergies  Allergen Reactions  . Aspirin Hives and Rash      Medication List       Accurate as of 04/01/16 11:15 AM. Always use your most recent med list.          albuterol (2.5 MG/3ML) 0.083% nebulizer solution Commonly known as:  PROVENTIL 1 inhalation four times a day 8 am, 12  Pm, 4 pm, 9  pm for wheezing and SOB   amoxicillin-clavulanate 875-125 MG tablet Commonly known as:  AUGMENTIN Take 1 tablet by mouth every 12 (twelve) hours.   atorvastatin 10 MG tablet Commonly known as:  LIPITOR Take 10 mg by mouth daily.   B-12 1000 MCG Tbcr Take 1 tablet by mouth 2 (two) times daily.   donepezil 10 MG tablet Commonly known as:  ARICEPT Take 10 mg by mouth daily after supper.   ELIQUIS 5 MG Tabs tablet Generic drug:  apixaban Take 5 mg by mouth 2 (two) times daily.   escitalopram 5 MG tablet Commonly known as:  LEXAPRO Take 5 mg by mouth every  evening.   FLOMAX 0.4 MG Caps capsule Generic drug:  tamsulosin Take 0.4 mg by mouth at bedtime.   furosemide 40 MG tablet Commonly known as:  LASIX Take 1 tablet (40 mg total) by mouth 2 (two) times daily.   lisinopril 5 MG tablet Commonly known as:  PRINIVIL,ZESTRIL Take 1 tablet (5 mg total) by mouth daily.   LORazepam 0.5 MG tablet Commonly known as:  ATIVAN Take 0.25 mg by mouth 2 (two) times daily.   metFORMIN 500 MG tablet Commonly known as:  GLUCOPHAGE Take 500 mg by mouth every evening. Along with 250 mg to equal 750 mg   metFORMIN 500 MG tablet Commonly known as:  GLUCOPHAGE Take 250 mg by mouth daily with breakfast. Take with 500mg  to equal 750mg    multivitamin with minerals Tabs tablet Take 1 tablet by mouth daily.   nitroGLYCERIN 0.4 MG SL tablet Commonly known as:  NITROSTAT Place 1 tablet (0.4 mg total) under the tongue every 5 (five) minutes as needed for chest pain.   OXYGEN Oxygen @@ 2L/mn via prn to maintain sats >90 every shift   traMADol 50 MG tablet Commonly known as:  ULTRAM Take 1 tablet (50 mg total) by mouth 3 (three) times daily as needed for moderate pain. For pain   vitamin C 1000 MG tablet Take 1,500 mg by mouth daily.   Vitamin D 2000 units Caps Take 1 capsule by mouth daily after supper.       Review of Systems  Reason unable to perform ROS: Limited due to patients  dementia.  Constitutional: Negative for activity change, appetite change, chills, fatigue and fever.  HENT: Positive for congestion.   Respiratory: Positive for cough, shortness of breath and wheezing. Negative for apnea, choking and chest tightness.   Cardiovascular: Negative for chest pain, palpitations and leg swelling.  Gastrointestinal: Negative for abdominal distention, abdominal pain, constipation and nausea.  Neurological: Positive for weakness. Negative for dizziness, tremors and light-headedness.  Psychiatric/Behavioral: Positive for behavioral problems, confusion and decreased concentration. The patient is hyperactive.     Vitals:   04/01/16 1104  BP: (!) 180/62     Pulse: 74  Resp: (!) 22  Temp: 97.4 F (36.3 C)  TempSrc: Oral   There is no height or weight on file to calculate BMI. Physical Exam  Constitutional: He appears well-developed and well-nourished.  HENT:  Mouth/Throat: Oropharynx is clear and moist.  Cardiovascular: Normal rate and regular rhythm.   Pulmonary/Chest: No respiratory distress. He has wheezes.  Has both expiratory and inspiratory Wheezing.  Abdominal: Soft. Bowel sounds are normal. He exhibits no distension. There is no tenderness. There is no rebound.  Musculoskeletal: He exhibits no edema.  Neurological: He is alert.  Not cooperative with exam.    Labs reviewed: Basic Metabolic Panel:  Recent Labs  16/10/96 0616 03/30/16 0759 03/31/16 0605  NA 140 140 139  K 3.6 4.2 3.7  CL 101 99* 97*  CO2 32 33* 33*  GLUCOSE 182* 195* 190*  BUN 21* 26* 31*  CREATININE 0.92 1.04 0.95  CALCIUM 8.7* 9.0 8.8*  MG  --  1.8 2.3   Liver Function Tests:  Recent Labs  08/06/15 0745 09/24/15 0720 03/25/16 1856  AST 17 16 36  ALT 13* 14* 16*  ALKPHOS 34* 43 54  BILITOT 0.5 0.7 0.7  PROT 5.5* 6.1* 6.8  ALBUMIN 2.9* 3.3* 3.5   No results for input(s): LIPASE, AMYLASE in the last 8760 hours. No results for input(s): AMMONIA  in the last 8760  hours. CBC:  Recent Labs  08/06/15 0745 09/24/15 0720 03/25/16 1856  03/29/16 0616 03/30/16 0759 03/31/16 0605  WBC 7.7 7.0 16.8*  < > 7.0 9.6 10.7*  NEUTROABS 5.4 4.8 11.3*  --   --   --   --   HGB 12.0* 12.7* 13.6  < > 12.8* 12.9* 13.5  HCT 37.2* 39.8 41.6  < > 39.7 40.7 40.6  MCV 86.9 86.3 87.8  < > 86.5 86.4 86.4  PLT 176 195 271  < > 198 235 239  < > = values in this interval not displayed. Cardiac Enzymes:  Recent Labs  03/25/16 2153 03/26/16 0338 03/26/16 0937  TROPONINI 0.06* 0.09* 0.07*   BNP: Invalid input(s): POCBNP Lab Results  Component Value Date   HGBA1C 8.0 (H) 01/06/2016   Lab Results  Component Value Date   TSH 1.803 03/07/2013   No results found for: VITAMINB12 No results found for: FOLATE No results found for: IRON, TIBC, FERRITIN  Imaging and Procedures obtained prior to SNF admission: Dg Chest Port 1 View  Result Date: 03/26/2016 CLINICAL DATA:  Shortness of Breath EXAM: PORTABLE CHEST 1 VIEW COMPARISON:  March 25, 2016 FINDINGS: There is new patchy airspace consolidation in the right base, consistent with focal pneumonia. There is mild atelectasis in the left base. Lungs elsewhere are clear. Heart is mildly enlarged with pulmonary vascularity within normal limits. No adenopathy. There is atherosclerotic calcification in the aorta. IMPRESSION: New airspace consolidation consistent with pneumonia right base. Mild left base atelectasis. Lungs elsewhere clear. Heart mildly enlarged, stable. Aortic atherosclerosis noted. Electronically Signed   By: Bretta BangWilliam  Woodruff III M.D.   On: 03/26/2016 07:42   Dg Chest Portable 1 View  Result Date: 03/25/2016 CLINICAL DATA:  Sudden onset shortness of breath and hypoxia. Dyspnea. EXAM: PORTABLE CHEST 1 VIEW COMPARISON:  05/09/2015. FINDINGS: Interval mild enlargement of the cardiac silhouette with increased prominence of the pulmonary vasculature and interstitial markings. No pleural fluid. Unremarkable bones.  IMPRESSION: Interval mild cardiomegaly and mild changes of acute congestive heart failure. Electronically Signed   By: Beckie SaltsSteven  Reid M.D.   On: 03/25/2016 19:15    Assessment/Plan  Aspiration pneumonia of B/L Lower Lobe S/P Multiple CVA Patient is on Modified diet as per Speech. D/W the wife to be careful with his feeding and not try to bring food from outside before approval. Patient is finishing his Augmentin.  COPD Patient was wheezing today. Will give him Extra dose of Nebs. Continue his other Nebs Q 6 hours.  Acute on chronic diastolic congestive heart failure  Echo showed EF of 50-55 % With Severe As. Patient lasix dose was increased to 40 mg BID. His usual dose in Facility was 20mg  qd. Wife says he has lost almost lost 10 lbs. And is 175 Lbs Will continue same dose but monitor weight and revaluate to see if need to decrease the dose.  Essential hypertension Will increase his Lisinopril as his Coreg was stopped. His BP has been high before also.  Moderate aortic stenosis Continue to Control BP.Conservative therapy  Pulmonary embolism Continue Eliquis  Type 2 diabetes mellitus  Restarted Metformin. Last A1C was 8.0 in 08/17    Family/ staff Communication:   Labs/tests ordered:

## 2016-04-02 ENCOUNTER — Telehealth: Payer: Self-pay

## 2016-04-02 NOTE — Telephone Encounter (Signed)
Possible re-admission to facility. [facility] if patient was re-admitted to facility upon discharge. [facility]. (I believe you have already seen this patient, but wanted to let you know).

## 2016-04-05 ENCOUNTER — Encounter (HOSPITAL_COMMUNITY)
Admission: RE | Admit: 2016-04-05 | Discharge: 2016-04-05 | Disposition: A | Discharge status: Home / Self Care | Payer: PPO | Source: Skilled Nursing Facility | Attending: Internal Medicine | Admitting: Internal Medicine

## 2016-04-05 DIAGNOSIS — Z7901 Long term (current) use of anticoagulants: Secondary | ICD-10-CM | POA: Diagnosis present

## 2016-04-05 DIAGNOSIS — M6281 Muscle weakness (generalized): Secondary | ICD-10-CM | POA: Insufficient documentation

## 2016-04-05 DIAGNOSIS — F0390 Unspecified dementia without behavioral disturbance: Secondary | ICD-10-CM | POA: Diagnosis not present

## 2016-04-05 DIAGNOSIS — R262 Difficulty in walking, not elsewhere classified: Secondary | ICD-10-CM | POA: Diagnosis not present

## 2016-04-05 DIAGNOSIS — R278 Other lack of coordination: Secondary | ICD-10-CM | POA: Diagnosis present

## 2016-04-05 LAB — BASIC METABOLIC PANEL
Anion gap: 8 (ref 5–15)
BUN: 34 mg/dL — AB (ref 6–20)
CHLORIDE: 100 mmol/L — AB (ref 101–111)
CO2: 31 mmol/L (ref 22–32)
Calcium: 8.7 mg/dL — ABNORMAL LOW (ref 8.9–10.3)
Creatinine, Ser: 1.01 mg/dL (ref 0.61–1.24)
GFR calc Af Amer: >60 mL/min (ref >60)
GFR calc non Af Amer: >60 mL/min (ref >60)
GLUCOSE: 182 mg/dL — AB (ref 65–99)
POTASSIUM: 4.3 mmol/L (ref 3.5–5.1)
SODIUM: 139 mmol/L (ref 135–145)

## 2016-04-05 LAB — CBC WITH DIFFERENTIAL/PLATELET
Basophils Absolute: 0 10*3/uL (ref 0.0–0.1)
Basophils Relative: 0 %
Eosinophils Absolute: 0.3 10*3/uL (ref 0.0–0.7)
Eosinophils Relative: 3 %
HCT: 41.5 % (ref 39.0–52.0)
HEMOGLOBIN: 13.1 g/dL (ref 13.0–17.0)
LYMPHS ABS: 1.1 10*3/uL (ref 0.7–4.0)
LYMPHS PCT: 11 %
MCH: 27.6 pg (ref 26.0–34.0)
MCHC: 31.6 g/dL (ref 30.0–36.0)
MCV: 87.4 fL (ref 78.0–100.0)
Monocytes Absolute: 0.5 10*3/uL (ref 0.1–1.0)
Monocytes Relative: 5 %
NEUTROS PCT: 81 %
Neutro Abs: 8.7 10*3/uL — ABNORMAL HIGH (ref 1.7–7.7)
Platelets: 257 10*3/uL (ref 150–400)
RBC: 4.75 MIL/uL (ref 4.22–5.81)
RDW: 13.3 % (ref 11.5–15.5)
WBC: 10.6 10*3/uL — AB (ref 4.0–10.5)

## 2016-05-04 ENCOUNTER — Encounter: Payer: Self-pay | Admitting: Internal Medicine

## 2016-05-04 ENCOUNTER — Non-Acute Institutional Stay (SKILLED_NURSING_FACILITY): Payer: PPO | Admitting: Internal Medicine

## 2016-05-04 DIAGNOSIS — J418 Mixed simple and mucopurulent chronic bronchitis: Secondary | ICD-10-CM

## 2016-05-04 DIAGNOSIS — I10 Essential (primary) hypertension: Secondary | ICD-10-CM | POA: Diagnosis not present

## 2016-05-04 DIAGNOSIS — I2699 Other pulmonary embolism without acute cor pulmonale: Secondary | ICD-10-CM

## 2016-05-04 DIAGNOSIS — Z9189 Other specified personal risk factors, not elsewhere classified: Secondary | ICD-10-CM

## 2016-05-04 DIAGNOSIS — I350 Nonrheumatic aortic (valve) stenosis: Secondary | ICD-10-CM | POA: Diagnosis not present

## 2016-05-04 DIAGNOSIS — E1159 Type 2 diabetes mellitus with other circulatory complications: Secondary | ICD-10-CM | POA: Diagnosis not present

## 2016-05-04 NOTE — Progress Notes (Signed)
Location:   [facility] Room Number: 139/D Place of Service:  SNF (31) Provider:  Pollyann SavoyAnjali,Natalie Mceuen L Evangelynn Lochridge, MD  Patient Care Team: Mahlon GammonAnjali L Tiannah Greenly, MD as PCP - General (Internal Medicine) Roma KayserGebreselassie W Nida, MD (Endocrinology) Laqueta LindenSuresh A Koneswaran, MD as Attending Physician (Cardiology)  Extended Emergency Contact Information Primary Emergency Contact: Moseley,Doris J Address: [address]          [place], [place] [address] [place] Home Phone: [phone] Relation: Spouse Secondary Emergency Contact: Boerner,Jason Address: [address]           [place], [place] [address] [place] Home Phone: [phone] Mobile Phone: [phone] Relation: Son  Code Status:  DNR Goals of care: Advanced Directive information Advanced Directives 05/04/2016  Does Patient Have a Medical Advance Directive? Yes  Type of Advance Directive Out of facility DNR (pink MOST or yellow form)  Does patient want to make changes to medical advance directive? No - Patient declined  Copy of Healthcare Power of Attorney in Chart? -  Would patient like information on creating a medical advance directive? -  Pre-existing out of facility DNR order (yellow form or pink MOST form) -     Chief Complaint  Patient presents with  . Medical Management of Chronic Issues    [place] Visit    HPI:  Pt is a 82 y.o. male seen today for medical management of chronic diseases.  Patient has H/O Hypertension, CAD, S/P multiple infarcts, CHF and COPD.  He was recently admitted to the hospital in Nov with Respiratory distress and was found to have Aspiration pneumonia. Since then Palliative care is involved with the patient per his family wishes patient is now in hospice care. He was suppose to be on modified diet but was very non compliant. But family now wants him to eat anything. He is also getting ativan PRN for his anxiety. He continue to have hacking cough and is Chronically  SOB. His recent Echo also showed that he has severe Aortic stenosis. Patient is not very reliable due to his dementia. He says he is comfortable with except the SOB.   Past Medical History:  Diagnosis Date  . Carotid artery occlusion    a. 05/2012 U/S Bilat ICA 20-39%.  Marland Kitchen. COPD (chronic obstructive pulmonary disease) (HCC)   . DM (diabetes mellitus) (HCC)   . Hiatal hernia   . HTN (hypertension)   . Macular degeneration   . Nephrolithiasis   . Obesity   . Pulmonary emboli (HCC)   . Stroke [facility](HCC)    a. 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 01/2009 TEE: EF 60-65%, mild MR, no LA/LAA thrombus.   Past Surgical History:  Procedure Laterality Date  . CATARACT EXTRACTION W/PHACO Right 10/02/2012   Procedure: CATARACT EXTRACTION PHACO AND INTRAOCULAR LENS PLACEMENT (IOC);  Surgeon: Gemma PayorKerry Hunt, MD;  Location: [facility];  Service: Ophthalmology;  Laterality: Right;  CDE 12.78  . CATARACT EXTRACTION W/PHACO Left 10/30/2012   Procedure: CATARACT EXTRACTION PHACO AND INTRAOCULAR LENS PLACEMENT (IOC);  Surgeon: Gemma PayorKerry Hunt, MD;  Location: [facility];  Service: Ophthalmology;  Laterality: Left;  CDE: 19.10  . EYE SURGERY    . FINGER SURGERY Left    pinky, after saw cut it off  . LEFT HEART CATHETERIZATION WITH CORONARY ANGIOGRAM Bilateral 03/08/2013   Procedure: LEFT HEART CATHETERIZATION WITH CORONARY ANGIOGRAM;  Surgeon: Laurey Moralealton S McLean, MD;  [facility];  Service: Cardiovascular;  Laterality: Bilateral;  . TEE  WITHOUT CARDIOVERSION N/A 12/16/2014   Procedure: TRANSESOPHAGEAL ECHOCARDIOGRAM (TEE);  Surgeon: Antoine Poche, MD;  [facility]: [facility];  Service: Cardiology;  Laterality: N/A;  will need 40 minutes to clean scope between procedures    Allergies  Allergen Reactions  . Aspirin Hives and Rash      Medication List       Accurate as of 05/04/16 10:43 AM. Always use your most recent med list.          albuterol (2.5 MG/3ML) 0.083% nebulizer solution Commonly known as:   PROVENTIL 1 inhalation four times a day 8 am, 12  Pm, 4 pm, 9 pm for wheezing and SOB   atorvastatin 10 MG tablet Commonly known as:  LIPITOR Take 10 mg by mouth daily.   B-12 1000 MCG Tbcr Take 1 tablet by mouth 2 (two) times daily.   donepezil 10 MG tablet Commonly known as:  ARICEPT Take 10 mg by mouth daily after supper.   ELIQUIS 5 MG Tabs tablet Generic drug:  apixaban Take 5 mg by mouth 2 (two) times daily.   escitalopram 5 MG tablet Commonly known as:  LEXAPRO Take 5 mg by mouth every evening.   FLOMAX 0.4 MG Caps capsule Generic drug:  tamsulosin Take 0.4 mg by mouth at bedtime.   furosemide 40 MG tablet Commonly known as:  LASIX Take 1 tablet (40 mg total) by mouth 2 (two) times daily.   lisinopril 10 MG tablet Commonly known as:  PRINIVIL,ZESTRIL Take 10 mg by mouth daily.   LORazepam 0.5 MG tablet Commonly known as:  ATIVAN Take 0.5 mg by mouth 3 (three) times daily.   metFORMIN 500 MG tablet Commonly known as:  GLUCOPHAGE Take 500 mg by mouth every evening. Along with 250 mg to equal 750 mg   metFORMIN 500 MG tablet Commonly known as:  GLUCOPHAGE Take 250 mg by mouth daily with breakfast. Take with 500mg  to equal 750mg    multivitamin with minerals Tabs tablet Take 1 tablet by mouth daily.   nitroGLYCERIN 0.4 MG SL tablet Commonly known as:  NITROSTAT Place 1 tablet (0.4 mg total) under the tongue every 5 (five) minutes as needed for chest pain.   OXYGEN Oxygen @@ 2L/mn via prn to maintain sats >90 every shift   traMADol 50 MG tablet Commonly known as:  ULTRAM Take 1 tablet (50 mg total) by mouth 3 (three) times daily as needed for moderate pain. For pain   vitamin C 1000 MG tablet Take 1,500 mg by mouth daily.   Vitamin D 2000 units Caps Take 1 capsule by mouth daily after supper.       Review of Systems  Reason unable to perform ROS: Not very reliable due to his dementia.  Constitutional: Positive for activity change and appetite  change. Negative for chills, diaphoresis, fatigue and fever.  HENT: Positive for congestion and postnasal drip. Negative for rhinorrhea, sinus pain and sinus pressure.   Respiratory: Positive for apnea, cough, chest tightness and shortness of breath.   Cardiovascular: Positive for leg swelling. Negative for chest pain.  Gastrointestinal: Negative for abdominal distention, abdominal pain and constipation.  Psychiatric/Behavioral: The patient is nervous/anxious.     Immunization History  Administered Date(s) Administered  . Influenza-Unspecified 02/20/2016  . Pneumococcal-Unspecified 03/03/2016   Pertinent  Health Maintenance Due  Topic Date Due  . FOOT EXAM  10/27/2016 (Originally 02/05/1938)  . OPHTHALMOLOGY EXAM  10/27/2016 (Originally 02/05/1938)  . HEMOGLOBIN A1C  07/08/2016  . PNA vac Low  Risk Adult (2 of 2 - PCV13) 03/03/2017  . INFLUENZA VACCINE  Completed   No flowsheet data found. Functional Status Survey:    Vitals:   05/04/16 1031  BP: (!) 117/57  Pulse: 70  Resp: (!) 26  Temp: 97.9 F (36.6 C)  TempSrc: Oral   There is no height or weight on file to calculate BMI. Physical Exam  Constitutional: He appears well-developed and well-nourished.  HENT:  Head: Normocephalic.  Mouth/Throat: Oropharynx is clear and moist.  Eyes: Pupils are equal, round, and reactive to light.  Neck: Neck supple.  Cardiovascular: Normal rate.   Murmur heard. Pulmonary/Chest: Effort normal.  Rales Bilateral.  Abdominal: Soft. Bowel sounds are normal. He exhibits no distension. There is no tenderness. There is no rebound.  Musculoskeletal:  Trace edema b/l  Lymphadenopathy:    He has no cervical adenopathy.  Neurological: He is alert.  Skin: Skin is warm.  Psychiatric:  Patient looks  anxious with his breathing.    Labs reviewed:  Recent Labs  03/30/16 0759 03/31/16 0605 04/05/16 0700  NA 140 139 139  K 4.2 3.7 4.3  CL 99* 97* 100*  CO2 33* 33* 31  GLUCOSE 195* 190*  182*  BUN 26* 31* 34*  CREATININE 1.04 0.95 1.01  CALCIUM 9.0 8.8* 8.7*  MG 1.8 2.3  --     Recent Labs  08/06/15 0745 09/24/15 0720 03/25/16 1856  AST 17 16 36  ALT 13* 14* 16*  ALKPHOS 34* 43 54  BILITOT 0.5 0.7 0.7  PROT 5.5* 6.1* 6.8  ALBUMIN 2.9* 3.3* 3.5    Recent Labs  09/24/15 0720 03/25/16 1856  03/30/16 0759 03/31/16 0605 04/05/16 0700  WBC 7.0 16.8*  < > 9.6 10.7* 10.6*  NEUTROABS 4.8 11.3*  --   --   --  8.7*  HGB 12.7* 13.6  < > 12.9* 13.5 13.1  HCT 39.8 41.6  < > 40.7 40.6 41.5  MCV 86.3 87.8  < > 86.4 86.4 87.4  PLT 195 271  < > 235 239 257  < > = values in this interval not displayed. Lab Results  Component Value Date   TSH 1.803 03/07/2013   Lab Results  Component Value Date   HGBA1C 8.0 (H) 01/06/2016   Lab Results  Component Value Date   CHOL 68 08/06/2015   HDL 27 (L) 08/06/2015   LDLCALC 10 08/06/2015   TRIG 154 (H) 08/06/2015   CHOLHDL 2.5 08/06/2015    Significant Diagnostic Results in last 30 days:  No results found.  Assessment/Plan  End stage COPD with high risk of Aspiration  Patient is now in the care of hospice.  He is on Duo nebs Q six hours. Ativan for Anxiety. Oxygen as needed. Patient continues to have chronic cough and SOB.  CHF with severe Aortic stenosis Continue on Lasix 40 mg BID. He has actually gained some weight recently to 180 lbs from 175 lbs.  Type 2 Diabetes Will continue Metformin for now.  H/o pulmonary embolism On Eliquis Will d/w family and see if it needs to be stopped. Hypertension BP well controlled on lisinopril. Hyperlipidemia Will stop Atorvastatin. Will also stop Aricept  Family/ staff Communication:   Labs/tests ordered:

## 2016-05-10 ENCOUNTER — Encounter (HOSPITAL_COMMUNITY)
Admission: RE | Admit: 2016-05-10 | Discharge: 2016-05-10 | Disposition: A | Discharge status: Home / Self Care | Payer: PPO | Source: Skilled Nursing Facility | Attending: Internal Medicine | Admitting: Internal Medicine

## 2016-05-10 DIAGNOSIS — Z7901 Long term (current) use of anticoagulants: Secondary | ICD-10-CM | POA: Insufficient documentation

## 2016-05-10 DIAGNOSIS — J698 Pneumonitis due to inhalation of other solids and liquids: Secondary | ICD-10-CM | POA: Insufficient documentation

## 2016-05-10 DIAGNOSIS — I10 Essential (primary) hypertension: Secondary | ICD-10-CM | POA: Diagnosis not present

## 2016-05-10 DIAGNOSIS — R1312 Dysphagia, oropharyngeal phase: Secondary | ICD-10-CM | POA: Insufficient documentation

## 2016-05-10 DIAGNOSIS — F0390 Unspecified dementia without behavioral disturbance: Secondary | ICD-10-CM | POA: Diagnosis not present

## 2016-05-10 DIAGNOSIS — E119 Type 2 diabetes mellitus without complications: Secondary | ICD-10-CM | POA: Insufficient documentation

## 2016-05-10 LAB — COMPREHENSIVE METABOLIC PANEL
ALT: 11 U/L — ABNORMAL LOW (ref 17–63)
AST: 14 U/L — AB (ref 15–41)
Albumin: 2.9 g/dL — ABNORMAL LOW (ref 3.5–5.0)
Alkaline Phosphatase: 50 U/L (ref 38–126)
Anion gap: 7 (ref 5–15)
BUN: 21 mg/dL — AB (ref 6–20)
CHLORIDE: 98 mmol/L — AB (ref 101–111)
CO2: 32 mmol/L (ref 22–32)
Calcium: 8.7 mg/dL — ABNORMAL LOW (ref 8.9–10.3)
Creatinine, Ser: 0.84 mg/dL (ref 0.61–1.24)
Glucose, Bld: 145 mg/dL — ABNORMAL HIGH (ref 65–99)
POTASSIUM: 3.9 mmol/L (ref 3.5–5.1)
SODIUM: 137 mmol/L (ref 135–145)
Total Bilirubin: 0.7 mg/dL (ref 0.3–1.2)
Total Protein: 6.2 g/dL — ABNORMAL LOW (ref 6.5–8.1)

## 2016-05-10 LAB — CBC WITH DIFFERENTIAL/PLATELET
BASOS ABS: 0 10*3/uL (ref 0.0–0.1)
Basophils Relative: 0 %
EOS PCT: 3 %
Eosinophils Absolute: 0.3 10*3/uL (ref 0.0–0.7)
HCT: 37.5 % — ABNORMAL LOW (ref 39.0–52.0)
Hemoglobin: 12.3 g/dL — ABNORMAL LOW (ref 13.0–17.0)
LYMPHS PCT: 13 %
Lymphs Abs: 1.1 10*3/uL (ref 0.7–4.0)
MCH: 28.5 pg (ref 26.0–34.0)
MCHC: 32.8 g/dL (ref 30.0–36.0)
MCV: 86.8 fL (ref 78.0–100.0)
MONO ABS: 0.5 10*3/uL (ref 0.1–1.0)
Monocytes Relative: 5 %
Neutro Abs: 6.6 10*3/uL (ref 1.7–7.7)
Neutrophils Relative %: 79 %
PLATELETS: 199 10*3/uL (ref 150–400)
RBC: 4.32 MIL/uL (ref 4.22–5.81)
RDW: 13.8 % (ref 11.5–15.5)
WBC: 8.4 10*3/uL (ref 4.0–10.5)

## 2016-05-20 DIAGNOSIS — I70203 Unspecified atherosclerosis of native arteries of extremities, bilateral legs: Secondary | ICD-10-CM | POA: Diagnosis not present

## 2016-05-20 DIAGNOSIS — M79674 Pain in right toe(s): Secondary | ICD-10-CM | POA: Diagnosis not present

## 2016-05-20 DIAGNOSIS — B351 Tinea unguium: Secondary | ICD-10-CM | POA: Diagnosis not present

## 2016-05-20 DIAGNOSIS — E1142 Type 2 diabetes mellitus with diabetic polyneuropathy: Secondary | ICD-10-CM | POA: Diagnosis not present

## 2016-05-21 ENCOUNTER — Other Ambulatory Visit: Payer: Self-pay | Admitting: *Deleted

## 2016-06-24 DEATH — deceased

## 2016-07-14 NOTE — Progress Notes (Signed)
This encounter was created in error - please disregard.

## 2017-06-17 IMAGING — CR DG CHEST 1V PORT
1 series · 1 of 1 positions shown · non-contrast
Comparison: 05/09/2015.

CLINICAL DATA: Sudden onset shortness of breath and hypoxia.
Dyspnea.

EXAM:
PORTABLE CHEST 1 VIEW

[portable]
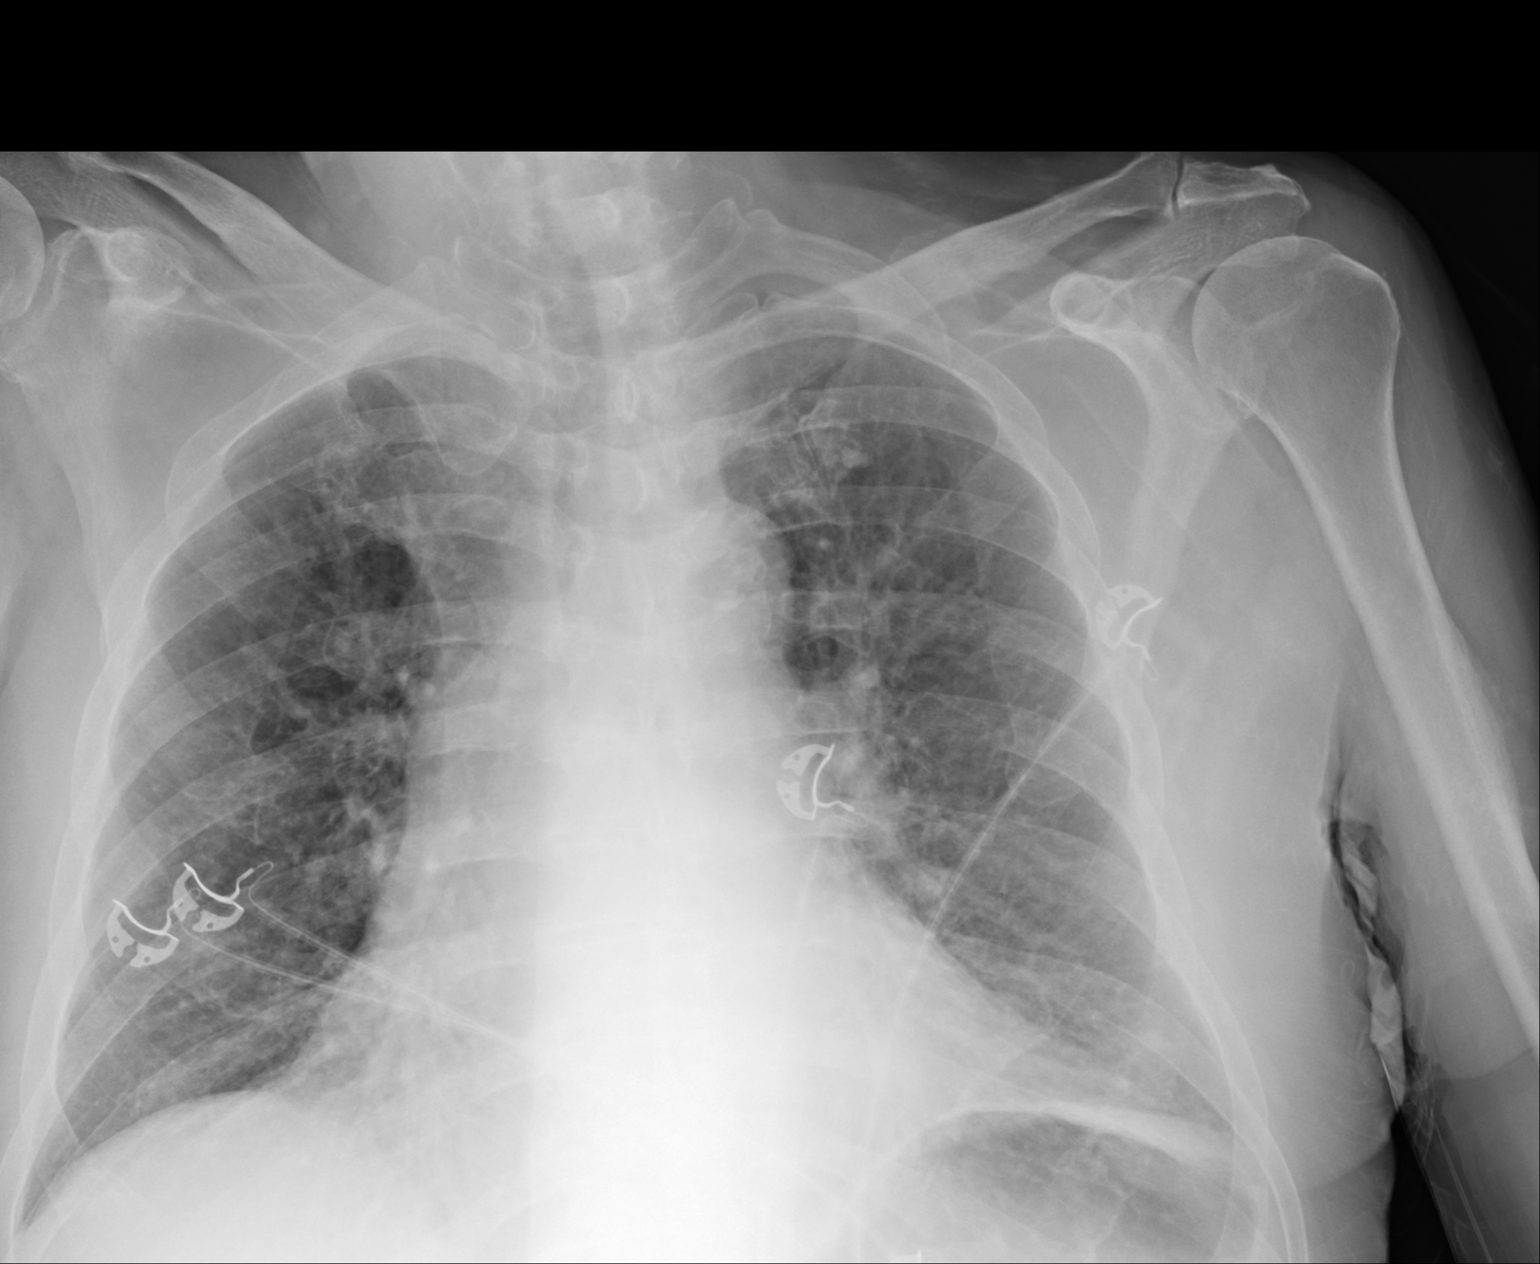

[1 of 1 positions shown; findings below may reference images not displayed]

FINDINGS: Interval mild enlargement of the cardiac silhouette with increased
prominence of the pulmonary vasculature and interstitial markings.
No pleural fluid. Unremarkable bones.
IMPRESSION: Interval mild cardiomegaly and mild changes of acute congestive
heart failure.

## 2017-06-18 IMAGING — CR DG CHEST 1V PORT
1 series · 1 of 1 positions shown · non-contrast
Comparison: March 25, 2016

CLINICAL DATA: Shortness of Breath

EXAM:
PORTABLE CHEST 1 VIEW

[portable]
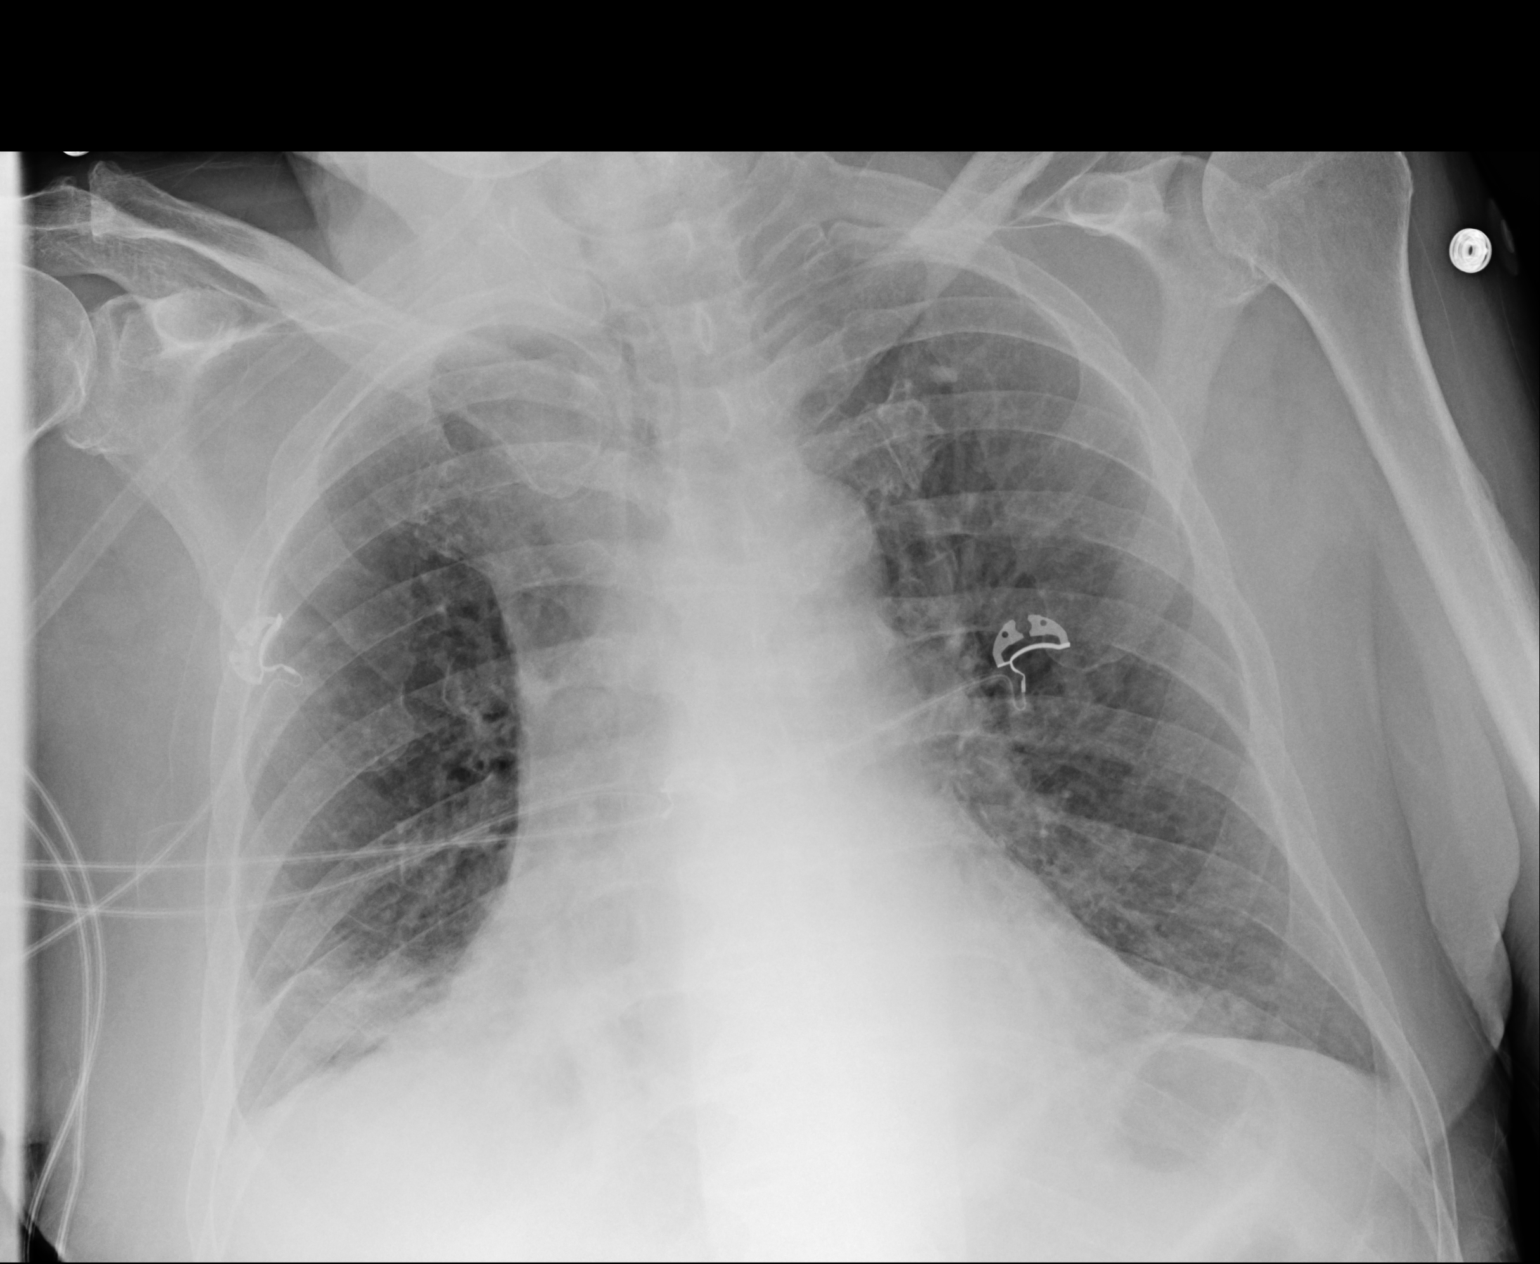

[1 of 1 positions shown; findings below may reference images not displayed]

FINDINGS: There is new patchy airspace consolidation in the right base,
consistent with focal pneumonia. There is mild atelectasis in the
left base. Lungs elsewhere are clear. Heart is mildly enlarged with
pulmonary vascularity within normal limits. No adenopathy. There is
atherosclerotic calcification in the aorta.
IMPRESSION: New airspace consolidation consistent with pneumonia right base.
Mild left base atelectasis. Lungs elsewhere clear. Heart mildly
enlarged, stable. Aortic atherosclerosis noted.

## 2017-06-21 IMAGING — CR DG CHEST 1V PORT
1 series · 1 of 1 positions shown · non-contrast
Comparison: 03/26/2016.

CLINICAL DATA: Shortness breath.

EXAM:
PORTABLE CHEST 1 VIEW

[portable]
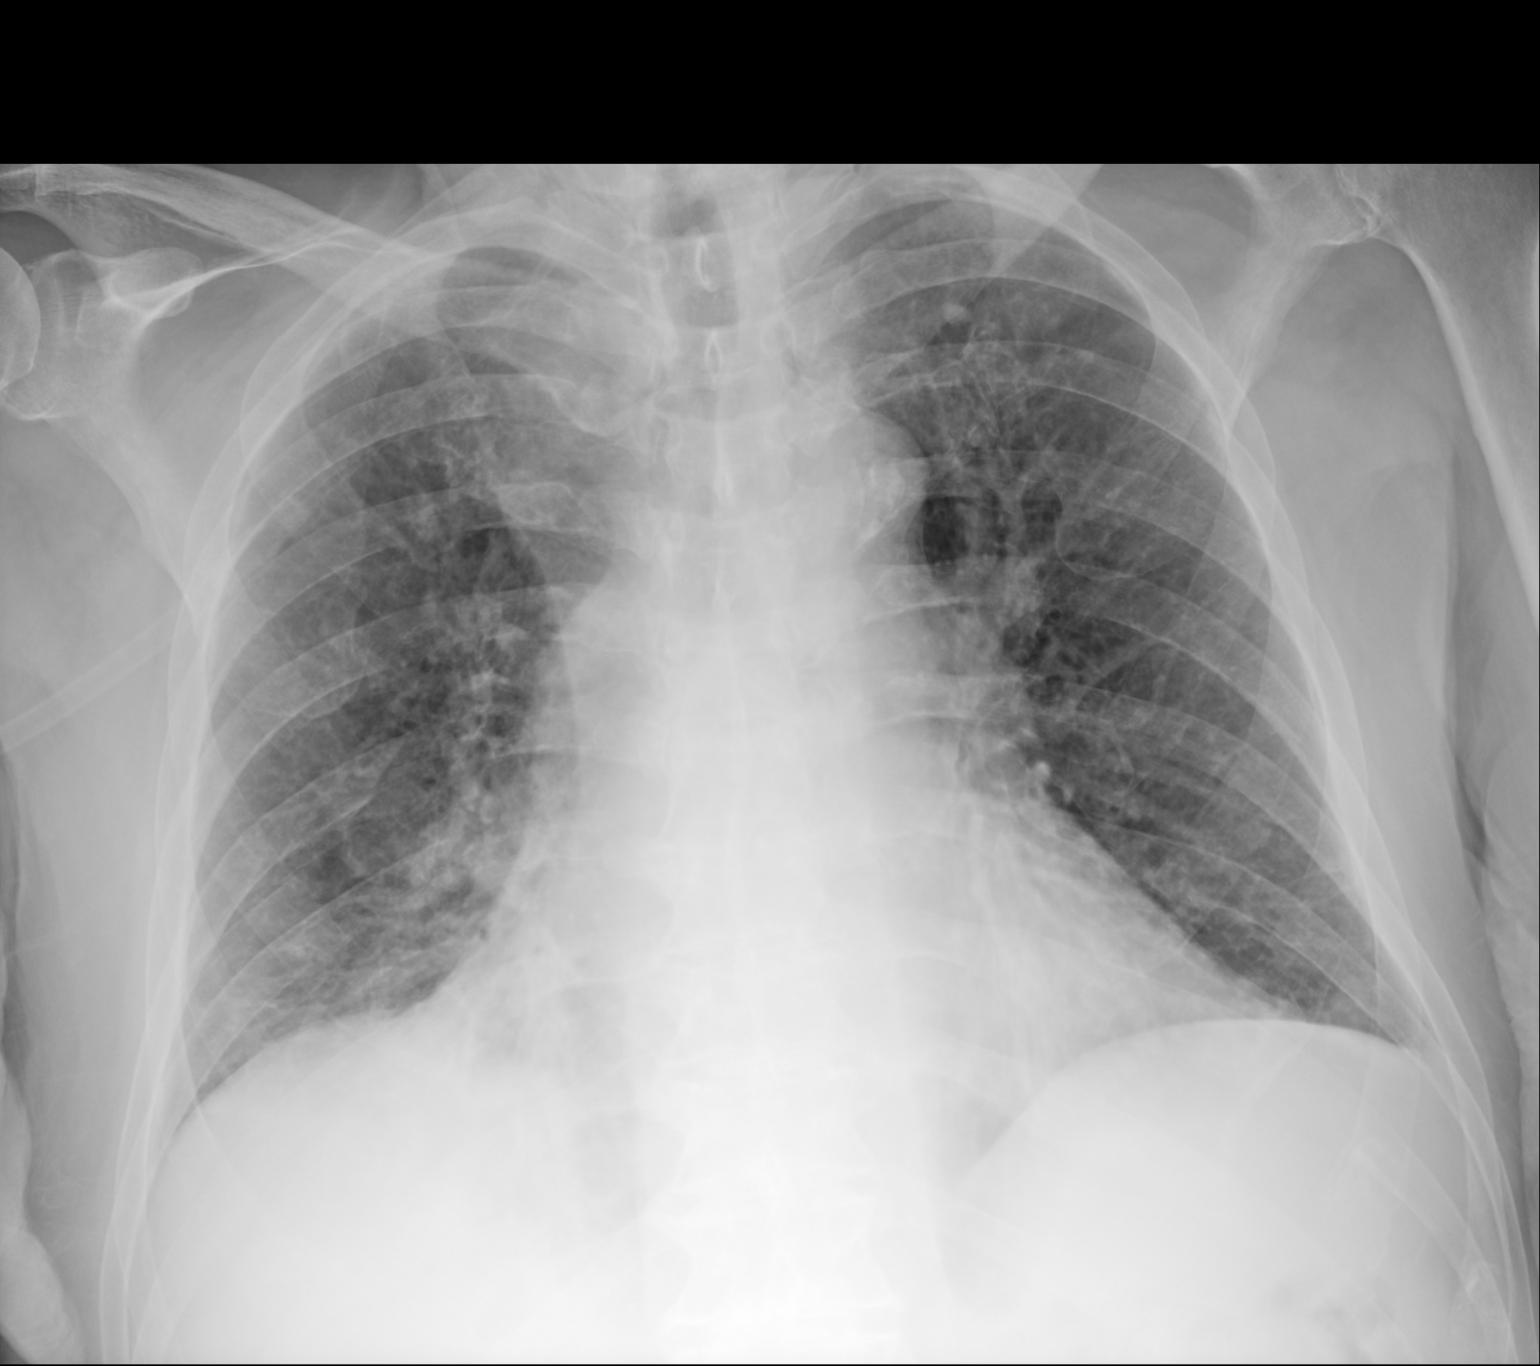

[1 of 1 positions shown; findings below may reference images not displayed]

FINDINGS: Cardiomegaly with mild bilateral from interstitial prominence. Mild
congestive heart failure cannot be excluded. Low lung volumes.
Interim slight improvement of right base atelectasis/infiltrate.
Calcified pulmonary nodules left upper lobe consistent granulomas
Tiny left pleural effusion cannot be excluded. No pneumothorax .
IMPRESSION: 1. Mild congestive heart failure cannot be excluded. Tiny left
pleural effusion.

2.  Persistent but slightly right base atelectatic changes.
